# Patient Record
Sex: Female | Born: 1965 | Race: Black or African American | Hispanic: No | Marital: Single | Smoking: Never smoker
Health system: Southern US, Community
[De-identification: ages and names within clinical notes are randomized; demographics above are authoritative.]

## PROBLEM LIST (undated history)

## (undated) DIAGNOSIS — T7840XA Allergy, unspecified, initial encounter: Secondary | ICD-10-CM

## (undated) DIAGNOSIS — B1910 Unspecified viral hepatitis B without hepatic coma: Secondary | ICD-10-CM

## (undated) DIAGNOSIS — I10 Essential (primary) hypertension: Secondary | ICD-10-CM

## (undated) DIAGNOSIS — G43909 Migraine, unspecified, not intractable, without status migrainosus: Secondary | ICD-10-CM

## (undated) DIAGNOSIS — N289 Disorder of kidney and ureter, unspecified: Secondary | ICD-10-CM

## (undated) DIAGNOSIS — Z5189 Encounter for other specified aftercare: Secondary | ICD-10-CM

## (undated) HISTORY — DX: Encounter for other specified aftercare: Z51.89

## (undated) HISTORY — DX: Migraine, unspecified, not intractable, without status migrainosus: G43.909

## (undated) HISTORY — DX: Allergy, unspecified, initial encounter: T78.40XA

## (undated) HISTORY — PX: CHOLECYSTECTOMY: SHX55

---

## 1998-05-23 HISTORY — PX: LAPAROSCOPIC INCISIONAL / UMBILICAL / VENTRAL HERNIA REPAIR: SUR789

## 2002-05-23 HISTORY — PX: ABDOMINAL HYSTERECTOMY: SHX81

## 2005-05-23 DIAGNOSIS — G43909 Migraine, unspecified, not intractable, without status migrainosus: Secondary | ICD-10-CM

## 2005-05-23 HISTORY — DX: Migraine, unspecified, not intractable, without status migrainosus: G43.909

## 2015-11-05 ENCOUNTER — Emergency Department (HOSPITAL_COMMUNITY)
Admission: EM | Admit: 2015-11-05 | Discharge: 2015-11-05 | Disposition: A | Discharge status: Home / Self Care | Payer: Self-pay | Attending: Emergency Medicine | Admitting: Emergency Medicine

## 2015-11-05 ENCOUNTER — Encounter (HOSPITAL_COMMUNITY): Payer: Self-pay | Admitting: Emergency Medicine

## 2015-11-05 DIAGNOSIS — D509 Iron deficiency anemia, unspecified: Secondary | ICD-10-CM | POA: Insufficient documentation

## 2015-11-05 DIAGNOSIS — R1013 Epigastric pain: Secondary | ICD-10-CM | POA: Insufficient documentation

## 2015-11-05 DIAGNOSIS — Z79899 Other long term (current) drug therapy: Secondary | ICD-10-CM | POA: Insufficient documentation

## 2015-11-05 DIAGNOSIS — I10 Essential (primary) hypertension: Secondary | ICD-10-CM | POA: Insufficient documentation

## 2015-11-05 HISTORY — DX: Essential (primary) hypertension: I10

## 2015-11-05 HISTORY — DX: Disorder of kidney and ureter, unspecified: N28.9

## 2015-11-05 LAB — URINALYSIS, ROUTINE W REFLEX MICROSCOPIC
Bilirubin Urine: NEGATIVE
Glucose, UA: NEGATIVE mg/dL
Hgb urine dipstick: NEGATIVE
Ketones, ur: NEGATIVE mg/dL
Leukocytes, UA: NEGATIVE
Nitrite: NEGATIVE
Protein, ur: NEGATIVE mg/dL
Specific Gravity, Urine: 1.015 (ref 1.005–1.030)
pH: 7 (ref 5.0–8.0)

## 2015-11-05 LAB — HEPATIC FUNCTION PANEL
ALT: 16 U/L (ref 14–54)
AST: 25 U/L (ref 15–41)
Albumin: 3.1 g/dL — ABNORMAL LOW (ref 3.5–5.0)
Alkaline Phosphatase: 90 U/L (ref 38–126)
Bilirubin, Direct: <0.1 mg/dL — ABNORMAL LOW (ref 0.1–0.5)
Total Bilirubin: 0.4 mg/dL (ref 0.3–1.2)
Total Protein: 7.9 g/dL (ref 6.5–8.1)

## 2015-11-05 LAB — CBC
HCT: 33.3 % — ABNORMAL LOW (ref 36.0–46.0)
Hemoglobin: 10.1 g/dL — ABNORMAL LOW (ref 12.0–15.0)
MCH: 22.5 pg — ABNORMAL LOW (ref 26.0–34.0)
MCHC: 30.3 g/dL (ref 30.0–36.0)
MCV: 74.2 fL — ABNORMAL LOW (ref 78.0–100.0)
Platelets: 195 10*3/uL (ref 150–400)
RBC: 4.49 MIL/uL (ref 3.87–5.11)
RDW: 14.2 % (ref 11.5–15.5)
WBC: 5.1 10*3/uL (ref 4.0–10.5)

## 2015-11-05 LAB — BASIC METABOLIC PANEL
Anion gap: 6 (ref 5–15)
BUN: 9 mg/dL (ref 6–20)
CO2: 25 mmol/L (ref 22–32)
Calcium: 8.9 mg/dL (ref 8.9–10.3)
Chloride: 106 mmol/L (ref 101–111)
Creatinine, Ser: 1.37 mg/dL — ABNORMAL HIGH (ref 0.44–1.00)
GFR calc Af Amer: 50 mL/min — ABNORMAL LOW (ref >60)
GFR calc non Af Amer: 43 mL/min — ABNORMAL LOW (ref >60)
Glucose, Bld: 104 mg/dL — ABNORMAL HIGH (ref 65–99)
Potassium: 3.6 mmol/L (ref 3.5–5.1)
Sodium: 137 mmol/L (ref 135–145)

## 2015-11-05 LAB — LIPASE, BLOOD: Lipase: 36 U/L (ref 11–51)

## 2015-11-05 MED ORDER — GI COCKTAIL ~~LOC~~
30.0000 mL | Freq: Once | ORAL | Status: AC
  Administered 2015-11-05: 30 mL via ORAL
  Filled 2015-11-05: qty 30

## 2015-11-05 NOTE — ED Notes (Signed)
Patient states has been having near syncope since March.  Patient states she was told by previous doctor, in March, that she had abnormally high iron levels.  Patient states they told her if she had problems to follow up with a doctor.  Patient states she hasn't been able to follow up because she doesn't have a primary doctor and has no insurance.

## 2015-11-05 NOTE — Discharge Instructions (Signed)
Try Zantac 150 mg twice a day. You can buy this over-the-counter. Iron Deficiency Anemia, Adult Anemia is when you have a low number of healthy red blood cells. It is often caused by too little iron. This is called iron deficiency anemia. It may make you tired and short of breath. HOME CARE   Take iron as told by your doctor.  Take vitamins as told by your doctor.  Eat foods that have iron in them. This includes liver, lean beef, whole-grain bread, eggs, dried fruit, and dark green leafy vegetables. GET HELP RIGHT AWAY IF:  You pass out (faint).  You have chest pain.  You feel sick to your stomach (nauseous) or throw up (vomit).  You get very short of breath with activity.  You are weak.  You have a fast heartbeat.  You start to sweat for no reason.  You become light-headed when getting up from a chair or bed. MAKE SURE YOU:  Understand these instructions.  Will watch your condition.  Will get help right away if you are not doing well or get worse.   This information is not intended to replace advice given to you by your health care provider. Make sure you discuss any questions you have with your health care provider.   Document Released: 06/11/2010 Document Revised: 05/30/2014 Document Reviewed: 01/14/2013 Elsevier Interactive Patient Education [employer].

## 2015-11-05 NOTE — ED Notes (Signed)
MD at bedside. 

## 2015-11-05 NOTE — ED Provider Notes (Signed)
CSN: [id number]     Arrival date & time 11/05/15  1405 History  By signing my name below, I, [facility], attest that this documentation has been prepared under the direction and in the presence of Deno Etienne, DO. Electronically Signed: Virgel Bouquet, ED Scribe. 11/05/2015. 8:21 PM.   Chief Complaint  Patient presents with  . Near Syncope  . abnormal iron levels    The history is provided by the patient. No language interpreter was used.   HPI Comments: Raine Bloyer is a 50 y.o. female with with HTN and renal disorder who presents to the Emergency Department complaining of intermittent, mild, near syncope ongoing for the past 3 months. Pt states that she has had multiple near-syncopal episodes over the past month that are preceded by weakness, nausea, and dizziness that she describes as the room spinning. She notes that these episodes occur at rest and while moving and often occur shortly after eating with associated mild, epigastric abdominal pain. Per pt, she recently moved from [facility] and was notified by a provider from [facility] that she has elevated iron levels and advised to present to the ED if she became ill. Denies any other symptoms currently.  Pt also complains of intermittent, mild BLE pain ongoing for the past few months. She notes that these pains often occur at night while she is lying down to sleep. Pain is sporadically improved with sitting up and moving the leg.   Past Medical History  Diagnosis Date  . Renal disorder   . Hypertension    History reviewed. No pertinent past surgical history. No family history on file. Social History  Substance Use Topics  . Smoking status: Never Smoker   . Smokeless tobacco: None  . Alcohol Use: No   OB History    No data available     Review of Systems  Gastrointestinal: Positive for nausea and abdominal pain.  Musculoskeletal: Positive for myalgias (BLE).  Neurological: Positive for dizziness and  weakness.  All other systems reviewed and are negative.     Allergies  Review of patient's allergies indicates no known allergies.  Home Medications   Prior to Admission medications   Medication Sig Start Date End Date Taking? Authorizing Provider  acetaminophen (TYLENOL) 500 MG tablet Take 500 mg by mouth every 6 (six) hours as needed for mild pain.   Yes Historical Provider, MD  lisinopril (PRINIVIL,ZESTRIL) 5 MG tablet Take 5 mg by mouth daily.   Yes Historical Provider, MD   BP 154/76 mmHg  Pulse 84  Temp(Src) 98.3 F (36.8 C) (Oral)  Resp 16  Ht 5\' 5"  (1.651 m)  Wt 193 lb (87.544 kg)  BMI 32.12 kg/m2  SpO2 100% Physical Exam  Constitutional: She is oriented to person, place, and time. She appears well-developed and well-nourished. No distress.  HENT:  Head: Normocephalic and atraumatic.  Eyes: EOM are normal. Pupils are equal, round, and reactive to light.  Neck: Normal range of motion. Neck supple.  Cardiovascular: Normal rate and regular rhythm.  Exam reveals no gallop and no friction rub.   No murmur heard. Pulmonary/Chest: Effort normal. She has no wheezes. She has no rales.  Abdominal: Soft. She exhibits no distension. There is tenderness in the epigastric area. There is negative Murphy's sign.  Musculoskeletal: She exhibits no edema or tenderness.  2+ DP pulses bilaterally. Feet appear warm and well diffused.  Neurological: She is alert and oriented to person, place, and time.  Skin: Skin is warm and  dry. She is not diaphoretic.  Psychiatric: She has a normal mood and affect. Her behavior is normal.  Nursing note and vitals reviewed.   ED Course  Procedures   DIAGNOSTIC STUDIES: Oxygen Saturation is 100% on RA, normal by my interpretation.    COORDINATION OF CARE: 7:45 PM Will order Gi cocktail and labs. Discussed treatment plan with pt at bedside and pt agreed to plan.   Labs Review Labs Reviewed  BASIC METABOLIC PANEL - Abnormal; Notable for the  following:    Glucose, Bld 104 (*)    Creatinine, Ser 1.37 (*)    GFR calc non Af Amer 43 (*)    GFR calc Af Amer 50 (*)    All other components within normal limits  CBC - Abnormal; Notable for the following:    Hemoglobin 10.1 (*)    HCT 33.3 (*)    MCV 74.2 (*)    MCH 22.5 (*)    All other components within normal limits  HEPATIC FUNCTION PANEL - Abnormal; Notable for the following:    Albumin 3.1 (*)    Bilirubin, Direct <0.1 (*)    All other components within normal limits  URINALYSIS, ROUTINE W REFLEX MICROSCOPIC (NOT AT [facility])  LIPASE, BLOOD  CBG MONITORING, ED    Imaging Review No results found. I have personally reviewed and evaluated these images and lab results as part of my medical decision-making.   EKG Interpretation   Date/Time:  Thursday November 05 2015 14:22:12 EDT Ventricular Rate:  87 PR Interval:  146 QRS Duration: 74 QT Interval:  388 QTC Calculation: 466 R Axis:   -17 Text Interpretation:  Normal sinus rhythm Anterior infarct , age  undetermined Abnormal ECG No old tracing to compare Confirmed by Elinora Weigand MD,  DANIEL [phone]) on 11/05/2015 7:33:42 PM      MDM   Final diagnoses:  Iron deficiency anemia  Epigastric pain    50 yo F With a chief complaint of epigastric abdominal pain and near-syncope. Patient states that she eats something she gets severe epigastric pain that makes her feel like she needs to vomit and then she almost passes out. This happened multiple times.  Mild epigastric pain on my exam. Symptoms improved with a GI cocktail. Tolerating by mouth. Negative Murphy sign. Labs are unremarkable. Patient will do a trial of Zantac. Follow-up with the family doctor.  9:32 PM:  I have discussed the diagnosis/risks/treatment options with the patient and believe the pt to be eligible for discharge home to follow-up with PCP. We also discussed returning to the ED immediately if new or worsening sx occur. We discussed the sx which are most  concerning (e.g., sudden worsening pain, fever, inability to tolerate by mouth) that necessitate immediate return. Medications administered to the patient during their visit and any new prescriptions provided to the patient are listed below.  Medications given during this visit Medications  gi cocktail (Maalox,Lidocaine,Donnatal) (30 mLs Oral Given 11/05/15 2005)    New Prescriptions   No medications on file    The patient appears reasonably screen and/or stabilized for discharge and I doubt any other medical condition or other [facility] requiring further screening, evaluation, or treatment in the ED at this time prior to discharge.    I personally performed the services described in this documentation, which was scribed in my presence. The recorded information has been reviewed and is accurate.     Deno Etienne, DO 11/05/15 2132

## 2015-11-05 NOTE — ED Notes (Signed)
Patient Alert and oriented X4. Stable and ambulatory. Patient verbalized understanding of the discharge instructions.  Patient belongings were taken by the patient.  

## 2016-01-20 ENCOUNTER — Ambulatory Visit (INDEPENDENT_AMBULATORY_CARE_PROVIDER_SITE_OTHER): Payer: Self-pay | Admitting: Internal Medicine

## 2016-01-20 ENCOUNTER — Encounter: Payer: Self-pay | Admitting: Internal Medicine

## 2016-01-20 VITALS — BP 130/80 | HR 74 | Resp 18 | Ht 64.0 in | Wt 191.0 lb

## 2016-01-20 DIAGNOSIS — T7840XA Allergy, unspecified, initial encounter: Secondary | ICD-10-CM

## 2016-01-20 DIAGNOSIS — I10 Essential (primary) hypertension: Secondary | ICD-10-CM | POA: Insufficient documentation

## 2016-01-20 DIAGNOSIS — M25562 Pain in left knee: Secondary | ICD-10-CM

## 2016-01-20 DIAGNOSIS — D649 Anemia, unspecified: Secondary | ICD-10-CM

## 2016-01-20 DIAGNOSIS — N289 Disorder of kidney and ureter, unspecified: Secondary | ICD-10-CM

## 2016-01-20 MED ORDER — LISINOPRIL 5 MG PO TABS: 5.0000 mg | ORAL_TABLET | Freq: Every day | ORAL | 11 refills | Status: DC

## 2016-01-20 NOTE — Patient Instructions (Addendum)
Ice and elevated hands--ice for about 20 minutes every 2 hours for next 24-48 hours.  Call if pain does not resolve Get scholarship for the [facility] on [place] and walk laps in the 3 feet of the pool Tylenol 500 mg 2 tabs twice daily for knee pain

## 2016-01-20 NOTE — Progress Notes (Signed)
Subjective:    Patient ID: Marissa Warner, female    DOB: 1966/04/05, 50 y.o.   MRN: [id number]  HPI  New patient to establish  1.  Essential Hypertension:  Diagnosed in 2007. Taking Lisinopril 5 mg.  Tolerates well.  BP has been well controlled on this dosing.  2.  Environmental and Seasonal Allergies:  Taking Claritin D. Not clear if she has been on just the antihistamine or not.  Discussed the decongestant can increase her bp.  Has been on Flonase in the past as well with good response.   3.  Intermittent Heartburn:  Takes Ranitidine only very intermittently.    4.  Vitamin D deficiency:  Diagnosed in 2014 when in hospital when living in [place].  We are sending for records.  5.  Crush injury to fingertip pads of middle and ring fingers bilaterally last night when fingertips caught in fold of garage door as she was pulling it down.  Some swelling and discomfort today.  She soaked her fingertips in alcohol, but not ice.  6. Left knee area with stabbing pain:  First noted in March of 2017.  Went to ED in June.   The visit, however, was more focused on her stomach discomfort and presyncope.  Was told in ED to stop taking NSAIDS due to abdominal pain.   7/  GI complaints:  See #6:   Ranitidine reportedly prescribed at that ED visit. Noted to have a mild anemia with Hgb at 10.1.  MCV of 74.2.  History of iron deficiency anemia reportedly due to heavy periods, possibly uterine fibroids for which she underwent hysterectomy in 2004.  8.  Hx of Migraines.    Current Meds  Medication Sig  . acetaminophen (TYLENOL) 500 MG tablet Take 500 mg by mouth every 6 (six) hours as needed for mild pain.  . Glycerin-Polysorbate 80 (REFRESH DRY EYE THERAPY OP) Apply 2 drops to eye QID.  Marland Kitchen lisinopril (PRINIVIL,ZESTRIL) 5 MG tablet Take 1 tablet (5 mg total) by mouth daily.  Marland Kitchen loratadine-pseudoephedrine (CLARITIN-D 24-HOUR) 10-240 MG 24 hr tablet Take 1 tablet by mouth daily.  . ranitidine (ZANTAC) 150  MG tablet Take 150 mg by mouth daily.  . [DISCONTINUED] lisinopril (PRINIVIL,ZESTRIL) 5 MG tablet Take 5 mg by mouth daily.   No Known Allergies  Past Medical History:  Diagnosis Date  . Allergy    environmental and seasonal  . Blood transfusion without reported diagnosis before 2004   due to heavy periods  . Hypertension   . Migraines 2007  . Renal disorder    Pt. states "Stage III Kidney disease"  Never had kidney biopsy.  She is unable to tell me what the cause of the kidney disease.  Reportedly diagnosed with [facility] in [place].   Past Surgical History:  Procedure Laterality Date  . ABDOMINAL HYSTERECTOMY  2004   With BSO, she thinks  . CHOLECYSTECTOMY  uncertain date   Laparoscopic  . LAPAROSCOPIC INCISIONAL / UMBILICAL / VENTRAL HERNIA REPAIR  2000   Social History   Social History  . Marital status: Single    Spouse name: N/A  . Number of children: 4  . Years of education: 12   Occupational History  . unemployed     Previously in housekeeping   Social History Main Topics  . Smoking status: Never Smoker  . Smokeless tobacco: Current User    Types: Chew     Comment: twice daily  . Alcohol use No  .  Drug use: No  . Sexual activity: No   Other Topics Concern  . Not on file   Social History Narrative   Originally from [place], [place] far from [place], ([place])   [place] to [place] in March 2017 to be near daughter.   Lives alone near [place].      Review of Systems     Objective:   Physical Exam  NAD HEENT:  PERRL, EOMI, discs sharp without hemorrhage TMs pearly gray, throat without injection. Neck:  Supple, no adenopathy, no thyromegaly Chest:  CTA CV:  RRR with normal S1 and S2, No S3, S4 or murmur, no carotid bruits, carotid, radial and DP pulses normal and Equal Abd:  S, NT, No HSM or mass, + BS Fingers:  Fingertips of middle and ring fingers bilaterally with mild swelling.  Full ROM of fingers.  No crepitation.  No  erythema Left Knee:  Full ROM, hypertrophic changes.  No joint line tenderness or pain with compression of patella to anterior joint.  No effusion.  No laxity or tenderness with stress maneuvers of cruciate and collateral ligaments.          Assessment & Plan:  1.  Essential Hypertension:  Had slightly elevated creatinine in June of this year at 1.37.  Continue Lisinopril and recheck CMP at follow up in 4 months  2.  Anemia:  Mild.  Looking back at chart after patient left, Hysterectomy in 2004 and had still an anemia with ED visit in June.  Will have patient return for repeat CBC, iron studies and guaiac cards.  3.  Crush injury to fingers:  Does not appear to have bony injury.  Elevation, ice and rest.  4.  Left knee pain: Likely some OA.  Encouraged water activities to get weight off joint and strengthen quadriceps muscle.  With anemia history, Tylenol 1000 mg twice daily for pain.  5.  Allergies:  Encouraged trying plain Claritin without decongestant to see if adequate control.

## 2016-01-27 ENCOUNTER — Encounter (INDEPENDENT_AMBULATORY_CARE_PROVIDER_SITE_OTHER): Payer: Self-pay | Admitting: Licensed Clinical Social Worker

## 2016-01-27 DIAGNOSIS — Z658 Other specified problems related to psychosocial circumstances: Secondary | ICD-10-CM

## 2016-01-27 DIAGNOSIS — F439 Reaction to severe stress, unspecified: Secondary | ICD-10-CM
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
## 2016-02-03 ENCOUNTER — Other Ambulatory Visit: Payer: Self-pay | Admitting: Licensed Clinical Social Worker

## 2016-02-04 ENCOUNTER — Other Ambulatory Visit (INDEPENDENT_AMBULATORY_CARE_PROVIDER_SITE_OTHER): Payer: Self-pay | Admitting: Licensed Clinical Social Worker

## 2016-02-04 DIAGNOSIS — Z596 Low income: Secondary | ICD-10-CM

## 2016-02-04 DIAGNOSIS — Z569 Unspecified problems related to employment: Secondary | ICD-10-CM
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
## 2016-03-02 ENCOUNTER — Telehealth: Payer: Self-pay | Admitting: Internal Medicine

## 2016-03-02 DIAGNOSIS — D62 Acute posthemorrhagic anemia: Secondary | ICD-10-CM | POA: Insufficient documentation

## 2016-03-02 DIAGNOSIS — G8929 Other chronic pain: Secondary | ICD-10-CM | POA: Insufficient documentation

## 2016-03-02 DIAGNOSIS — M25562 Pain in left knee: Secondary | ICD-10-CM | POA: Insufficient documentation

## 2016-03-02 DIAGNOSIS — D649 Anemia, unspecified: Secondary | ICD-10-CM | POA: Insufficient documentation

## 2016-03-03 ENCOUNTER — Telehealth: Payer: Self-pay | Admitting: Licensed Clinical Social Worker

## 2016-03-03 NOTE — Telephone Encounter (Signed)
Social Work Intern ([place]) called Kendrick Fries to set up an appointment. Jesslynn did not answer. Voice mail was not set up.

## 2016-03-04 NOTE — Telephone Encounter (Signed)
Spoke with patient appointment made for tuesday

## 2016-03-07 ENCOUNTER — Other Ambulatory Visit: Payer: Self-pay

## 2016-03-08 ENCOUNTER — Other Ambulatory Visit (INDEPENDENT_AMBULATORY_CARE_PROVIDER_SITE_OTHER): Payer: Self-pay

## 2016-03-08 DIAGNOSIS — D649 Anemia, unspecified: Secondary | ICD-10-CM

## 2016-03-08 DIAGNOSIS — I10 Essential (primary) hypertension: Secondary | ICD-10-CM

## 2016-03-09 LAB — COMPREHENSIVE METABOLIC PANEL
ALT: 15 IU/L (ref 0–32)
AST: 22 IU/L (ref 0–40)
Albumin/Globulin Ratio: 0.9 — ABNORMAL LOW (ref 1.2–2.2)
Albumin: 3.6 g/dL (ref 3.5–5.5)
Alkaline Phosphatase: 93 IU/L (ref 39–117)
BUN/Creatinine Ratio: 15 (ref 9–23)
BUN: 18 mg/dL (ref 6–24)
Bilirubin Total: <0.2 mg/dL (ref 0.0–1.2)
CO2: 21 mmol/L (ref 18–29)
Calcium: 8.8 mg/dL (ref 8.7–10.2)
Chloride: 104 mmol/L (ref 96–106)
Creatinine, Ser: 1.19 mg/dL — ABNORMAL HIGH (ref 0.57–1.00)
GFR calc Af Amer: 61 mL/min/{1.73_m2} (ref >59)
GFR calc non Af Amer: 53 mL/min/{1.73_m2} — ABNORMAL LOW (ref >59)
Globulin, Total: 3.8 g/dL (ref 1.5–4.5)
Glucose: 108 mg/dL — ABNORMAL HIGH (ref 65–99)
Potassium: 4.1 mmol/L (ref 3.5–5.2)
Sodium: 139 mmol/L (ref 134–144)
Total Protein: 7.4 g/dL (ref 6.0–8.5)

## 2016-03-09 LAB — CBC WITH DIFFERENTIAL/PLATELET
Basophils Absolute: 0 10*3/uL (ref 0.0–0.2)
Basos: 0 %
EOS (ABSOLUTE): 0.1 10*3/uL (ref 0.0–0.4)
Eos: 1 %
Hematocrit: 31.9 % — ABNORMAL LOW (ref 34.0–46.6)
Hemoglobin: 9.8 g/dL — ABNORMAL LOW (ref 11.1–15.9)
Immature Grans (Abs): 0 10*3/uL (ref 0.0–0.1)
Immature Granulocytes: 0 %
Lymphocytes Absolute: 1.7 10*3/uL (ref 0.7–3.1)
Lymphs: 32 %
MCH: 23.2 pg — ABNORMAL LOW (ref 26.6–33.0)
MCHC: 30.7 g/dL — ABNORMAL LOW (ref 31.5–35.7)
MCV: 75 fL — ABNORMAL LOW (ref 79–97)
Monocytes Absolute: 0.3 10*3/uL (ref 0.1–0.9)
Monocytes: 5 %
Neutrophils Absolute: 3.4 10*3/uL (ref 1.4–7.0)
Neutrophils: 62 %
Platelets: 209 10*3/uL (ref 150–379)
RBC: 4.23 x10E6/uL (ref 3.77–5.28)
RDW: 16.1 % — ABNORMAL HIGH (ref 12.3–15.4)
WBC: 5.5 10*3/uL (ref 3.4–10.8)

## 2016-03-09 LAB — IRON AND TIBC
Iron Saturation: 22 % (ref 15–55)
Iron: 56 ug/dL (ref 27–159)
Total Iron Binding Capacity: 252 ug/dL (ref 250–450)
UIBC: 196 ug/dL (ref 131–425)

## 2016-04-07 ENCOUNTER — Encounter: Payer: Self-pay | Admitting: Internal Medicine

## 2016-04-07 ENCOUNTER — Ambulatory Visit (INDEPENDENT_AMBULATORY_CARE_PROVIDER_SITE_OTHER): Payer: Self-pay | Admitting: Internal Medicine

## 2016-04-07 VITALS — BP 124/72 | HR 80 | Temp 98.3°F | Resp 12 | Ht 63.0 in | Wt 186.0 lb

## 2016-04-07 DIAGNOSIS — R112 Nausea with vomiting, unspecified: Secondary | ICD-10-CM

## 2016-04-07 DIAGNOSIS — D649 Anemia, unspecified: Secondary | ICD-10-CM

## 2016-04-07 MED ORDER — PROCHLORPERAZINE MALEATE 10 MG PO TABS
10.0000 mg | ORAL_TABLET | Freq: Three times a day (TID) | ORAL | 0 refills | Status: DC | PRN
Start: 2016-04-07 — End: 2018-01-19

## 2016-04-07 NOTE — Progress Notes (Signed)
   Subjective:    Patient ID: Marissa Warner, female    DOB: 09/11/1965, 50 y.o.   MRN: [id number]  HPI  Difficult historian--loses focus Nausea and vomiting started yesterday.  Vomited 3 times yesterday and 3 times so far today.  Developed headache started this morning, left frontal and around left eye.  Describes as a sharp pain.  Took Tylenol this morning 1000 mg, but could not keep down.   Has been eating oatmeal, bread, boiled chicken and water.  Cinnamon [employer] yesterday as well.   No fever. Feels she has this repeatedly--states last was 1 month ago.  Then states has this twice monthly.  Feels this has been a problem since March. Normal BM this morning, though green.   No cough, congestion, sore throat.    Current Meds  Medication Sig  . acetaminophen (TYLENOL) 500 MG tablet Take 500 mg by mouth every 6 (six) hours as needed for mild pain.  Marland Kitchen lisinopril (PRINIVIL,ZESTRIL) 5 MG tablet Take 1 tablet (5 mg total) by mouth daily.  . ranitidine (ZANTAC) 150 MG tablet Take 150 mg by mouth daily.  . Vitamin D, Ergocalciferol, (DRISDOL) 50000 units CAPS capsule Take 50,000 Units by mouth every 7 (seven) days.   No Known Allergies   Review of Systems     Objective:   Physical Exam  Constitutional: She appears well-developed and well-nourished. No distress.  HENT:  Head: Normocephalic and atraumatic.  Right Ear: Tympanic membrane and ear canal normal.  Left Ear: Tympanic membrane, external ear and ear canal normal.  Nose: Nose normal.  Mouth/Throat: Uvula is midline and oropharynx is clear and moist. No posterior oropharyngeal erythema.  Eyes: EOM are normal. Pupils are equal, round, and reactive to light.  Neck: Normal range of motion. Neck supple.  Cardiovascular: Normal rate, regular rhythm, S1 normal and S2 normal.  Exam reveals no friction rub.   No murmur heard. Pulmonary/Chest: Effort normal and breath sounds normal.  Abdominal: Soft. Bowel sounds are normal. She  exhibits no mass. There is no hepatosplenomegaly. There is no tenderness. No hernia.  Lymphadenopathy:    She has no cervical adenopathy.          Assessment & Plan:  Nausea and vomiting:  Not sure if this is truly recurrent.  Did not discuss at previous visit, yet states this has been a recurring problem since March.  Appears well currently Treat Nausea with Compazine prn Clear liquids only for next 24 hours--frequent sips Tylenol for Headache Call if no improvement next 24 hours.  Regarding anemia:  Stool cards x3 given to return in 2 weeks. To get started on iron and will cancel recheck CBC on 11/30.  Will check when follows up end of December   Stop chewing tobacco

## 2016-04-07 NOTE — Patient Instructions (Addendum)
  For next 24 hours, sip throughout the day on clear liquids.  May take Tylenol for headache. No solids Tomorrow, if able to keep clear liquids down, may eat bananas, crackers, chicken noodle soup, jello, toast, rice or rice cereal.   When you are feeling better, to work on blood sugar and weight loss:    Drink a glass of water before every meal Drink 6-8 glasses of water daily Eat three meals daily Eat a protein and healthy fat with every meal (eggs,fish, chicken, [place] and limit red meats) Eat 5 servings of vegetables daily, mix the colors Eat 2 servings of fruit daily with skin, if skin is edible Use smaller plates Put food/utensils down as you chew and swallow each bite Eat at a table with friends/family at least once daily, no TV Do not eat in front of the TV

## 2016-05-20 ENCOUNTER — Ambulatory Visit: Payer: Self-pay | Admitting: Internal Medicine

## 2016-06-15 ENCOUNTER — Ambulatory Visit: Payer: Self-pay | Admitting: Internal Medicine

## 2016-07-26 ENCOUNTER — Encounter: Payer: Self-pay | Admitting: Internal Medicine

## 2016-07-26 ENCOUNTER — Ambulatory Visit (INDEPENDENT_AMBULATORY_CARE_PROVIDER_SITE_OTHER): Payer: Self-pay | Admitting: Internal Medicine

## 2016-07-26 VITALS — BP 130/90 | HR 80 | Temp 97.8°F | Resp 12 | Ht 64.5 in | Wt 180.0 lb

## 2016-07-26 DIAGNOSIS — I10 Essential (primary) hypertension: Secondary | ICD-10-CM

## 2016-07-26 DIAGNOSIS — J069 Acute upper respiratory infection, unspecified: Secondary | ICD-10-CM

## 2016-07-26 DIAGNOSIS — N289 Disorder of kidney and ureter, unspecified: Secondary | ICD-10-CM

## 2016-07-26 DIAGNOSIS — G43709 Chronic migraine without aura, not intractable, without status migrainosus: Secondary | ICD-10-CM

## 2016-07-26 DIAGNOSIS — D649 Anemia, unspecified: Secondary | ICD-10-CM

## 2016-07-26 DIAGNOSIS — R739 Hyperglycemia, unspecified: Secondary | ICD-10-CM

## 2016-07-26 DIAGNOSIS — B9789 Other viral agents as the cause of diseases classified elsewhere: Secondary | ICD-10-CM

## 2016-07-26 MED ORDER — VERAPAMIL HCL ER 180 MG PO CP24: ORAL_CAPSULE | ORAL | 11 refills | Status: DC

## 2016-07-26 NOTE — Progress Notes (Signed)
Subjective:    Patient ID: Marissa Warner, female    DOB: 1966/05/05, 51 y.o.   MRN: [id number]  HPI   1.  Essential Hypertension:  States has not missed her Lisinopril 5 mg daily.  Is ill today.  Denies use of decongestant.  Taking Mucinex and Tylenol.  Did chew tobacco this morning before coming in.   Has not followed up as recommended with mild renal insufficiency and bloodwork.  2.  Tobacco Use:  States she is trying to stop.  Chews once daily.  Not interested in discussing today.  3.  2-3 days ago, started with cough, rarely productive of white sputum.  No fever, sore throat.  Her chest anteriorly is sore when coughs.  No myalgias or joint pain with this illness.  No dyspnea.  As above, using Mucinex and Tylenol.  4.  Headaches:  States she was diagnosed with migraines in 2007.  No aura.  Headaches are left sided, frontal or parietal areas.  Throbbing pain.  No photobphobia, possibly some phonophobia.  Sometimes has associated nausea and vomiting.   Diagnosed and treated at [facility] in [place], [place].  She does not believe she mentioned this office before and has not signed a release.  She cannot recall if she was treated with a tryptan in the past or not. Cannot recall if took a preventive in past.   Cannot get a good idea of how often she has a headache--as often as twice weekly.  States she has had one now for 2-3 days.  Not clear how often the headache resolves after sleep. Current headache at same time as respiratory illness.  5. Iron Deficiency anemia:  Has been taking ferrous gluconate once daily 324 mg.  Last colonoscopy in 03/2015 at [facility] for nausea and vomiting.  She was found to have diverticulosis and internal hemorrhoids.  EGD the same day showed moderately erythematous mucosa without active bleeding.  Biopsies were normal. Continues on Ranitidine 150 mg once daily.  Not taking regularly as reported back in August when asked her to do so.     6.   Hyperglycemia:  Noted last labs.  Did not follow up as requested. No history of elevated glucose per patient.  A1C   Current Meds  Medication Sig  . acetaminophen (TYLENOL) 500 MG tablet Take 500 mg by mouth every 6 (six) hours as needed for mild pain.  . Ferrous Gluconate 324 (37.5 Fe) MG TABS Take by mouth 2 (two) times daily.  . Glycerin-Polysorbate 80 (REFRESH DRY EYE THERAPY OP) Apply 2 drops to eye QID.  Marland Kitchen guaiFENesin (MUCINEX) 600 MG 12 hr tablet Take by mouth 2 (two) times daily.  Marland Kitchen lisinopril (PRINIVIL,ZESTRIL) 5 MG tablet Take 1 tablet (5 mg total) by mouth daily.  . ranitidine (ZANTAC) 150 MG tablet Take 150 mg by mouth daily.  . Vitamin D, Ergocalciferol, (DRISDOL) 50000 units CAPS capsule Take 50,000 Units by mouth every 7 (seven) days.    No Known Allergies    Review of Systems     Objective:   Physical Exam  Coughing, but in no distress. HEENT:  PERRL, EOMI, palpebral conjunctivae pale,  Discs sharp. TMs pearly gray, throat without injection, edentulous. Neck:  Supple, No adenopathy Chest:  CTA CV:  RRR without rub.  Radial and DP pulses normal and equal Abd:  S, NT, No HSM or mass, + BS Neuro:  A & O x 3, CN II-XII grossly intact, DTRs 2+/4 throughout, Motor 5/5  throughtout.  Gait normal.        Assessment & Plan:  1.  Essential Hypertension:  Likely up a bit due to illness.  No decongestant use.  Adding Verapamil for migraine prevention, which will help control as well.  BP and pulse check 2 weeks after starting.  2.  Migraines:  Send for old records.  Poor historian, but sounds like having migraines regularly, so start prophylaxis with Verapamil.  3.  URI:  Supportive care.  Avoid decongestants  To call if worsen.  4.  Renal insufficiency:  CMP recheck  5.  Hyperglycemia:  Noted in labs last fall.  A1C  6.  Anemia with borderline low iron studies.  To get stool cards in--states she still has, though EGD and colonoscopy without concerning findings.  Also  asked she use the Ranitidine regularly.  Check B12 and Folate as well.  To take iron twice daily, not once.  7.  Chew Tobacco use:  No interest in ways to support her stopping use.

## 2016-07-27 ENCOUNTER — Telehealth: Payer: Self-pay | Admitting: Internal Medicine

## 2016-07-27 LAB — FOLATE: Folate: 9.5 ng/mL (ref >3.0)

## 2016-07-27 LAB — CBC WITH DIFFERENTIAL/PLATELET
Basophils Absolute: 0 10*3/uL (ref 0.0–0.2)
Basos: 0 %
EOS (ABSOLUTE): 0.1 10*3/uL (ref 0.0–0.4)
Eos: 2 %
Hematocrit: 33.9 % — ABNORMAL LOW (ref 34.0–46.6)
Hemoglobin: 10.5 g/dL — ABNORMAL LOW (ref 11.1–15.9)
Immature Grans (Abs): 0 10*3/uL (ref 0.0–0.1)
Immature Granulocytes: 0 %
Lymphocytes Absolute: 1 10*3/uL (ref 0.7–3.1)
Lymphs: 19 %
MCH: 22.6 pg — ABNORMAL LOW (ref 26.6–33.0)
MCHC: 31 g/dL — ABNORMAL LOW (ref 31.5–35.7)
MCV: 73 fL — ABNORMAL LOW (ref 79–97)
Monocytes Absolute: 0.6 10*3/uL (ref 0.1–0.9)
Monocytes: 11 %
Neutrophils Absolute: 3.3 10*3/uL (ref 1.4–7.0)
Neutrophils: 68 %
Platelets: 196 10*3/uL (ref 150–379)
RBC: 4.64 x10E6/uL (ref 3.77–5.28)
RDW: 16.3 % — ABNORMAL HIGH (ref 12.3–15.4)
WBC: 5 10*3/uL (ref 3.4–10.8)

## 2016-07-27 LAB — VITAMIN B12: Vitamin B-12: 1011 pg/mL (ref 232–1245)

## 2016-07-27 LAB — HGB A1C W/O EAG: Hgb A1c MFr Bld: 5.4 % (ref 4.8–5.6)

## 2016-07-27 NOTE — Telephone Encounter (Signed)
To Dr. Mulberry FYI 

## 2016-07-27 NOTE — Telephone Encounter (Signed)
Pat received a call from [facility].  They have not seen patient since December 12, 2007.  Can do 2007, 2008, 2009 notes.  No labs  Or consults.  Old software they had was corrupted.  Records that they have will be sent in 60 days.   This message received from [facility] [place], [place]

## 2016-08-01 NOTE — Telephone Encounter (Signed)
Ok--guess we won't be getting that information then

## 2016-10-24 ENCOUNTER — Encounter (HOSPITAL_COMMUNITY): Payer: Self-pay | Admitting: *Deleted

## 2016-10-24 ENCOUNTER — Emergency Department (HOSPITAL_COMMUNITY)
Admission: EM | Admit: 2016-10-24 | Discharge: 2016-10-24 | Disposition: A | Discharge status: Home / Self Care | Payer: Self-pay | Attending: Emergency Medicine | Admitting: Emergency Medicine

## 2016-10-24 ENCOUNTER — Telehealth: Payer: Self-pay | Admitting: Internal Medicine

## 2016-10-24 DIAGNOSIS — Z79899 Other long term (current) drug therapy: Secondary | ICD-10-CM | POA: Insufficient documentation

## 2016-10-24 DIAGNOSIS — I10 Essential (primary) hypertension: Secondary | ICD-10-CM | POA: Insufficient documentation

## 2016-10-24 DIAGNOSIS — M5412 Radiculopathy, cervical region: Secondary | ICD-10-CM | POA: Insufficient documentation

## 2016-10-24 MED ORDER — GABAPENTIN 100 MG PO CAPS: 100.0000 mg | ORAL_CAPSULE | Freq: Three times a day (TID) | ORAL | 0 refills | Status: DC

## 2016-10-24 MED ORDER — KETOROLAC TROMETHAMINE 60 MG/2ML IM SOLN
15.0000 mg | Freq: Once | INTRAMUSCULAR | Status: AC
  Administered 2016-10-24: 15 mg via INTRAMUSCULAR
  Filled 2016-10-24: qty 2

## 2016-10-24 MED ORDER — MELOXICAM 7.5 MG PO TABS: 7.5000 mg | ORAL_TABLET | Freq: Every day | ORAL | 0 refills | Status: DC | PRN

## 2016-10-24 MED ORDER — OXYCODONE-ACETAMINOPHEN 5-325 MG PO TABS
1.0000 | ORAL_TABLET | Freq: Once | ORAL | Status: AC
  Administered 2016-10-24: 1 via ORAL
  Filled 2016-10-24: qty 1

## 2016-10-24 MED ORDER — DEXAMETHASONE 4 MG PO TABS
12.0000 mg | ORAL_TABLET | Freq: Once | ORAL | Status: AC
  Administered 2016-10-24: 12 mg via ORAL
  Filled 2016-10-24: qty 3

## 2016-10-24 NOTE — ED Provider Notes (Signed)
[facility] DEPT Provider Note   CSN: [id number] Arrival date & time: 10/24/16  1023  By signing my name below, I, Levester Fresh, attest that this documentation has been prepared under the direction and in the presence of Virgel Manifold, MD . Electronically Signed: Levester Fresh, Scribe. 10/24/2016. 1:18 PM.  History   Chief Complaint Chief Complaint  Patient presents with  . Pain  . Numbness   HPI Comments Marissa Warner is a 51 y.o. female with a PMHx significant for hypertension, who presents to the Emergency Department with complaints of episodes of sharp, "pins and needles" left neck and shoulder pain.  This radiates to her left arm, all left fingers and sometimes to her left leg.  These episodes have been occurring intermittently since May of 2016 and take place mostly at night.  No notable triggers. No worsening or alleviating factors.  No hx of DM.  No recent trauma.  Pt has attempted to treat her sx with Tylenol and experienced no relief.  She has not seen a MD for the symptoms prior to today. Sensation intact to left hand, with no numbness or weakness. She reports some trouble sleeping 2/2 discomfort.  Pt with hx of fall in 2016 with similar sx 2/2 muscle spasms.  Today, she denies experiencing any other acute sx, including fever or chills.   The history is provided by the patient and medical records. No language interpreter was used.   Past Medical History:  Diagnosis Date  . Allergy    environmental and seasonal  . Blood transfusion without reported diagnosis before 2004   due to heavy periods  . Hypertension   . Migraines 2007  . Renal disorder    Pt. states "Stage III Kidney disease"  Never had kidney biopsy.  She is unable to tell me what the cause of the kidney disease.  Reportedly diagnosed with [facility] in [place].    Patient Active Problem List   Diagnosis Date Noted  . Anemia 03/02/2016  . Left knee pain 03/02/2016  . Essential hypertension  01/20/2016  . Allergy   . Hypertension   . Renal disorder     Past Surgical History:  Procedure Laterality Date  . ABDOMINAL HYSTERECTOMY  2004   With BSO, she thinks  . CHOLECYSTECTOMY  uncertain date   Laparoscopic  . LAPAROSCOPIC INCISIONAL / UMBILICAL / VENTRAL HERNIA REPAIR  2000    OB History    No data available       Home Medications    Prior to Admission medications   Medication Sig Start Date End Date Taking? Authorizing Provider  acetaminophen (TYLENOL) 500 MG tablet Take 500 mg by mouth every 6 (six) hours as needed for mild pain.    [provider]  Ferrous Gluconate 324 (37.5 Fe) MG TABS Take by mouth 2 (two) times daily.    [provider]  gabapentin (NEURONTIN) 100 MG capsule Take 1 capsule (100 mg total) by mouth 3 (three) times daily. 10/24/16   Virgel Manifold, MD  Glycerin-Polysorbate 80 (REFRESH DRY EYE THERAPY OP) Apply 2 drops to eye QID.    [provider]  guaiFENesin (MUCINEX) 600 MG 12 hr tablet Take by mouth 2 (two) times daily.    [provider]  lisinopril (PRINIVIL,ZESTRIL) 5 MG tablet Take 1 tablet (5 mg total) by mouth daily. 01/20/16   Mack Hook, MD  meloxicam (MOBIC) 7.5 MG tablet Take 1 tablet (7.5 mg total) by mouth daily as needed for  pain. 10/24/16   Virgel Manifold, MD  prochlorperazine (COMPAZINE) 10 MG tablet Take 1 tablet (10 mg total) by mouth every 8 (eight) hours as needed for nausea or vomiting. Patient not taking: Reported on 07/26/2016 04/07/16   Mack Hook, MD  ranitidine (ZANTAC) 150 MG tablet Take 150 mg by mouth daily.    [provider]  verapamil (VERELAN PM) 180 MG 24 hr capsule 1 cap by mouth daily 07/26/16   Mack Hook, MD  Vitamin D, Ergocalciferol, (DRISDOL) 50000 units CAPS capsule Take 50,000 Units by mouth every 7 (seven) days.    [provider]    Family History History reviewed. No pertinent family history.  Social History Social History   Substance Use Topics  . Smoking status: Never Smoker  . Smokeless tobacco: Current User    Types: Chew     Comment: twice daily  . Alcohol use No   Allergies   Patient has no known allergies.  Review of Systems Review of Systems  Constitutional: Negative for chills and fever.  Musculoskeletal: Positive for neck pain.  Neurological: Positive for numbness. Negative for weakness.  All other systems reviewed and are negative.  Physical Exam Updated Vital Signs BP 113/89 (BP Location: Left Arm)   Pulse 76   Temp 98.2 F (36.8 C) (Oral)   Resp 18   SpO2 100%   Physical Exam  Constitutional: She is oriented to person, place, and time. She appears well-developed and well-nourished. No distress.  HENT:  Head: Normocephalic and atraumatic.  Eyes: EOM are normal.  Neck: Normal range of motion.  No neck tenderness.  Cardiovascular: Normal rate, regular rhythm and normal heart sounds.   Pulmonary/Chest: Effort normal and breath sounds normal.  Abdominal: Soft.  Musculoskeletal: Normal range of motion.  Strength and sensation normal in bilateral upper extremities.  Palpable left radial pulse.  Neurological: She is alert and oriented to person, place, and time.  Skin: Skin is warm and dry.  Psychiatric: She has a normal mood and affect. Judgment normal.  Nursing note and vitals reviewed.  ED Treatments / Results  DIAGNOSTIC STUDIES: Oxygen Saturation is 100% on room air, normal by my interpretation.    COORDINATION OF CARE: 12:45 PM Discussed treatment plan with pt at bedside and pt agreed to plan.  Labs (all labs ordered are listed, but only abnormal results are displayed) Labs Reviewed - No data to display  EKG  EKG Interpretation None      Radiology No results found.  Procedures Procedures (including critical care time)  Medications Ordered in ED Medications  ketorolac (TORADOL) injection 15 mg (not administered)  oxyCODONE-acetaminophen (PERCOCET/ROXICET)  5-325 MG per tablet 1 tablet (not administered)  dexamethasone (DECADRON) tablet 12 mg (not administered)     Initial Impression / Assessment and Plan / ED Course  I have reviewed the triage vital signs and the nursing notes.  Pertinent labs & imaging results that were available during my care of the patient were reviewed by me and considered in my medical decision making (see chart for details).     51yF with L neck and LUE pain. Suspect cervical radiculopathy. Neuro exam is nonfocal. I doubt vascular issue. No reported trauma. Doubt CVA with description of symptoms and exam. Very atypical for ACS. Will treat symptomatically.   It has been determined that no acute conditions requiring further emergency intervention are present at this time. The patient has been advised of the diagnosis and plan. We have discussed signs and symptoms that  warrant return to the ED and they are listed in the discharge instructions.   I personally preformed the services scribed in my presence. The recorded information has been reviewed is accurate. Virgel Manifold, MD.   Final Clinical Impressions(s) / ED Diagnoses   Final diagnoses:  Cervical radiculopathy    New Prescriptions New Prescriptions   GABAPENTIN (NEURONTIN) 100 MG CAPSULE    Take 1 capsule (100 mg total) by mouth 3 (three) times daily.   MELOXICAM (MOBIC) 7.5 MG TABLET    Take 1 tablet (7.5 mg total) by mouth daily as needed for pain.     Virgel Manifold, MD 10/24/16 1324

## 2016-10-24 NOTE — Telephone Encounter (Signed)
Patient called back with update. States she had a pinched nerve. Patient was scheduled for appointment with Dr. Amil Amen for tomorrow. Patient cancelled and rescheduled for 11/14/16.

## 2016-10-24 NOTE — Telephone Encounter (Signed)
Pat confirming appointments.  Patient stated she is in the ER, was experiencing burning, tingling pain in side.  Patient stated she would call the office once she leaves the ER to give an update.

## 2016-10-24 NOTE — ED Triage Notes (Signed)
Pt reports left neck/shoulder pain that radiates down her left side, arm and leg. Has been occurring intermittently since 2016. Reports it gets worse when lying down at night and causes a numbness/tingling to left side of her body. No acute distress is noted at triage.

## 2016-10-25 ENCOUNTER — Ambulatory Visit: Payer: Self-pay | Admitting: Internal Medicine

## 2016-11-14 ENCOUNTER — Ambulatory Visit: Payer: Self-pay | Admitting: Internal Medicine

## 2016-11-16 ENCOUNTER — Ambulatory Visit: Payer: Self-pay | Admitting: Internal Medicine

## 2016-12-23 ENCOUNTER — Ambulatory Visit (INDEPENDENT_AMBULATORY_CARE_PROVIDER_SITE_OTHER): Payer: Self-pay | Admitting: Internal Medicine

## 2016-12-23 ENCOUNTER — Encounter: Payer: Self-pay | Admitting: Internal Medicine

## 2016-12-23 VITALS — BP 130/90 | HR 86 | Resp 12 | Ht 64.5 in | Wt 180.0 lb

## 2016-12-23 DIAGNOSIS — R109 Unspecified abdominal pain: Secondary | ICD-10-CM

## 2016-12-23 DIAGNOSIS — I10 Essential (primary) hypertension: Secondary | ICD-10-CM

## 2016-12-23 DIAGNOSIS — D649 Anemia, unspecified: Secondary | ICD-10-CM

## 2016-12-23 DIAGNOSIS — R7989 Other specified abnormal findings of blood chemistry: Secondary | ICD-10-CM

## 2016-12-23 DIAGNOSIS — R739 Hyperglycemia, unspecified: Secondary | ICD-10-CM

## 2016-12-23 LAB — POCT URINALYSIS DIPSTICK
Bilirubin, UA: NEGATIVE
Blood, UA: NEGATIVE
Glucose, UA: NEGATIVE
Ketones, UA: NEGATIVE
Leukocytes, UA: NEGATIVE
Nitrite, UA: NEGATIVE
Protein, UA: 15
Spec Grav, UA: 1.025 (ref 1.010–1.025)
Urobilinogen, UA: 0.2 E.U./dL
pH, UA: 5 (ref 5.0–8.0)

## 2016-12-23 MED ORDER — LISINOPRIL 10 MG PO TABS: ORAL_TABLET | ORAL | 11 refills | Status: DC

## 2016-12-23 NOTE — Progress Notes (Signed)
Subjective:    Patient ID: Marissa Warner, female    DOB: March 21, 1966, 51 y.o.   MRN: [id number]  HPI  1.  Essential Hypertension:  Taking Lisinopril 5 mg daily.  Does not miss.  Was on 10 mg in past when living in [place], apparently some concern with renal function on a higher dose.  She does have a mildly elevated creatinine now.  DBP still a bit high at 90.  2.  Anemia:  Is taking iron tabs once daily.  Iron studies were borderline.  B12 and folate.  Perhaps with thall trait or trait for other abnormal hemoglobin.  May have a niece with abnormal hemoglobin or sickle cell trait.  Her children have sickle cell trait.  She is not sure if her children's father have the trait.    3.  Hyperglycemia:  A1C was in normal range at 5.4%, but discussed she is at increased risk for DM in the future.  She has worked on diet.  She is walking 3 days weekly to [employer].  About 30 minutes.  She is willing to work this up.  She is thinking about Zumba classes.  4.  Tobacco Use:  Chewing tobacco.  States she has cut back.  Not interested in any discussion about this today.  5.  Right ear feels like something in it this week.  Wants me to check. Seems like that is better, however.    6.  Low bilateral thoracic back pain vs. Flank pain:  Mild, no fever, dysuria or increased frequency, no hematuria.  No suprapubic pain.   Current Meds  Medication Sig  . acetaminophen (TYLENOL) 500 MG tablet Take 500 mg by mouth every 6 (six) hours as needed for mild pain.  . Ferrous Gluconate 324 (37.5 Fe) MG TABS Take by mouth 2 (two) times daily.  Marland Kitchen gabapentin (NEURONTIN) 100 MG capsule Take 1 capsule (100 mg total) by mouth 3 (three) times daily.  . Glycerin-Polysorbate 80 (REFRESH DRY EYE THERAPY OP) Apply 2 drops to eye QID.  Marland Kitchen guaiFENesin (MUCINEX) 600 MG 12 hr tablet Take by mouth 2 (two) times daily.  Marland Kitchen lisinopril (PRINIVIL,ZESTRIL) 10 MG tablet 1 tab by mouth daily  . meloxicam (MOBIC) 7.5 MG tablet Take 1 tablet  (7.5 mg total) by mouth daily as needed for pain.  . ranitidine (ZANTAC) 150 MG tablet Take 150 mg by mouth daily.  . verapamil (VERELAN PM) 180 MG 24 hr capsule 1 cap by mouth daily  . Vitamin D, Ergocalciferol, (DRISDOL) 50000 units CAPS capsule Take 50,000 Units by mouth every 7 (seven) days.  . [DISCONTINUED] lisinopril (PRINIVIL,ZESTRIL) 5 MG tablet Take 1 tablet (5 mg total) by mouth daily.    No Known Allergies   Review of Systems     Objective:   Physical Exam NAD HEENT:  PERRL, EOMI, TMs pearly gray and canals clear.  Throat without injection. Neck:  Supple, No adenopathy, no thyromegaly Chest:  CTA CV:  RRR with normal S1 and S2, No S3, S4 or murmur, radial and DP pulses normal and equal Abd:  S, NT, No flank tenderness, No HSM or mass, No suprapubic tenderness. + BS LE:  No edema       Assessment & Plan:  1.  Essential Hypertension:  Check BMP for creatinine level and UA before increase of ACEI.  Increase Lisinopril to 10 mg daily with repeat bp and pulse and BMP in 2 weeks.  2.  Anemia:  CBC--was improved last check.  Taking iron.  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  May be sickle cell trait as well.  3.  Hyperglycemia:  To work on diet and increasing regular physical activity for weight loss.  + family history.  4.  Mild back pain:  No findings on exam.  Checking UA.  5.  Right ear complaints:  Resolved now and no abnormal findings.  followup in 3 months as well for CPE without pap

## 2016-12-24 LAB — CBC WITH DIFFERENTIAL/PLATELET
Basophils Absolute: 0 10*3/uL (ref 0.0–0.2)
Basos: 1 %
EOS (ABSOLUTE): 0 10*3/uL (ref 0.0–0.4)
Eos: 1 %
Hematocrit: 35.3 % (ref 34.0–46.6)
Hemoglobin: 10.8 g/dL — ABNORMAL LOW (ref 11.1–15.9)
Immature Grans (Abs): 0 10*3/uL (ref 0.0–0.1)
Immature Granulocytes: 0 %
Lymphocytes Absolute: 1.9 10*3/uL (ref 0.7–3.1)
Lymphs: 36 %
MCH: 23.5 pg — ABNORMAL LOW (ref 26.6–33.0)
MCHC: 30.6 g/dL — ABNORMAL LOW (ref 31.5–35.7)
MCV: 77 fL — ABNORMAL LOW (ref 79–97)
Monocytes Absolute: 0.3 10*3/uL (ref 0.1–0.9)
Monocytes: 6 %
Neutrophils Absolute: 3 10*3/uL (ref 1.4–7.0)
Neutrophils: 56 %
Platelets: 216 10*3/uL (ref 150–379)
RBC: 4.59 x10E6/uL (ref 3.77–5.28)
RDW: 16.2 % — ABNORMAL HIGH (ref 12.3–15.4)
WBC: 5.2 10*3/uL (ref 3.4–10.8)

## 2016-12-24 LAB — BASIC METABOLIC PANEL
BUN/Creatinine Ratio: 12 (ref 9–23)
BUN: 14 mg/dL (ref 6–24)
CO2: 21 mmol/L (ref 20–29)
Calcium: 9.1 mg/dL (ref 8.7–10.2)
Chloride: 105 mmol/L (ref 96–106)
Creatinine, Ser: 1.18 mg/dL — ABNORMAL HIGH (ref 0.57–1.00)
GFR calc Af Amer: 61 mL/min/{1.73_m2} (ref >59)
GFR calc non Af Amer: 53 mL/min/{1.73_m2} — ABNORMAL LOW (ref >59)
Glucose: 77 mg/dL (ref 65–99)
Potassium: 4.3 mmol/L (ref 3.5–5.2)
Sodium: 140 mmol/L (ref 134–144)

## 2017-01-06 ENCOUNTER — Other Ambulatory Visit: Payer: Self-pay

## 2017-01-16 ENCOUNTER — Other Ambulatory Visit: Payer: Self-pay

## 2017-03-31 ENCOUNTER — Ambulatory Visit: Payer: Self-pay | Admitting: Internal Medicine

## 2017-05-02 ENCOUNTER — Ambulatory Visit: Payer: Self-pay | Admitting: Internal Medicine

## 2017-06-21 ENCOUNTER — Ambulatory Visit: Payer: Self-pay | Admitting: Internal Medicine

## 2017-12-22 ENCOUNTER — Other Ambulatory Visit: Payer: Self-pay | Admitting: Internal Medicine

## 2018-01-19 ENCOUNTER — Encounter: Payer: Self-pay | Admitting: Internal Medicine

## 2018-01-19 ENCOUNTER — Ambulatory Visit: Payer: Self-pay | Admitting: Internal Medicine

## 2018-01-19 VITALS — BP 130/88 | HR 88 | Resp 14 | Ht 64.5 in | Wt 187.5 lb

## 2018-01-19 DIAGNOSIS — H524 Presbyopia: Secondary | ICD-10-CM

## 2018-01-19 DIAGNOSIS — N289 Disorder of kidney and ureter, unspecified: Secondary | ICD-10-CM

## 2018-01-19 DIAGNOSIS — G43809 Other migraine, not intractable, without status migrainosus: Secondary | ICD-10-CM

## 2018-01-19 DIAGNOSIS — M542 Cervicalgia: Secondary | ICD-10-CM

## 2018-01-19 DIAGNOSIS — I10 Essential (primary) hypertension: Secondary | ICD-10-CM

## 2018-01-19 MED ORDER — SUMATRIPTAN SUCCINATE 50 MG PO TABS: ORAL_TABLET | ORAL | 11 refills | Status: DC

## 2018-01-19 MED ORDER — LISINOPRIL 10 MG PO TABS: ORAL_TABLET | ORAL | 3 refills | Status: DC

## 2018-01-19 MED ORDER — CYCLOBENZAPRINE HCL 10 MG PO TABS: ORAL_TABLET | ORAL | 0 refills | Status: DC

## 2018-01-19 NOTE — Progress Notes (Signed)
   Subjective:    Patient ID: Marissa Warner, female    DOB: 19-Jul-1965, 52 y.o.   MRN: [id number]  HPI   Here after a year's hiatus.  1.  Essential Hypertension:  Had not run out of Lisinopril.  Tolerating fine.  2.  Muscle spasm:  Bilateral trap area with numbness and tingling down her left arm.  Has been bothering her for about 6 months.   Patient with limited transportation. Taking Tylenol  500 mg up to 4 times daily.  Uses this for headaches as well.  3.  Migraine Headaches:  See visit from 07/2016.  She was to start on Verapamil.  She cannot recall if she took the Verapamil.  Cannot recall if she did take if it helped.  Rx was sent to [facility] and would have been delivered.  She knows she did not have that. Having monthly headaches lasting 4 days. She has never taken a triptan  4.  Vision:  Vision gets blurry.  She has a history of having reading glasses, but has not used in some time. She cannot afford the $70 to go to an eye doctor.    5.  Gets nauseated with eating twice daily.  Does vomit at times.  Very difficult historian.  6.  Anemia:  Not taking iron.  Current Meds  Medication Sig  . acetaminophen (TYLENOL) 500 MG tablet Take 500 mg by mouth every 6 (six) hours as needed for mild pain.  . Glycerin-Polysorbate 80 (REFRESH DRY EYE THERAPY OP) Apply 2 drops to eye QID.  Marland Kitchen lisinopril (PRINIVIL,ZESTRIL) 10 MG tablet TAKE 1 TABLET BY MOUTH ONCE DAILY    No Known Allergies     Review of Systems     Objective:   Physical Exam  NAD HEENT:  PERRL, EOMI, Discs sharp, TMs pearly gray, throat without injection Neck:  Supple, No adenopathy, mildly enlarged thyroid Chest:  CTA CV:  RRR with normal S1 and S2, No S3, S4 or murmur.  Radial and DP pulses normal and equal LE:  No edema      Assessment & Plan:  1.  Hypertension:  Controlled. Return for fasting labs.  Labs in 1 week to check kidney function  2.  Presbyopia:  +3.00 glasses x 2 pair given.  These seemed  to work for her.  3.  Neck and trap pain:  Normal neuro exam.  Feel this is muscular.  Cyclobenzaprine 5-10 mg as needed every 8 hours.  No transport for PT, so will hold on that for now.  4.  Migraines:  Sumatriptan 50 mg to repeat in 2 hours if not relieved.  5.  Possibly enlarged thyroid:  TSH with fasting labs

## 2018-01-25 ENCOUNTER — Other Ambulatory Visit: Payer: Self-pay

## 2018-01-25 DIAGNOSIS — E010 Iodine-deficiency related diffuse (endemic) goiter: Secondary | ICD-10-CM

## 2018-01-25 DIAGNOSIS — Z79899 Other long term (current) drug therapy: Secondary | ICD-10-CM

## 2018-01-25 DIAGNOSIS — Z13220 Encounter for screening for lipoid disorders: Secondary | ICD-10-CM

## 2018-01-26 LAB — COMPREHENSIVE METABOLIC PANEL
ALT: 18 IU/L (ref 0–32)
AST: 25 IU/L (ref 0–40)
Albumin/Globulin Ratio: 1 — ABNORMAL LOW (ref 1.2–2.2)
Albumin: 3.8 g/dL (ref 3.5–5.5)
Alkaline Phosphatase: 83 IU/L (ref 39–117)
BUN/Creatinine Ratio: 10 (ref 9–23)
BUN: 12 mg/dL (ref 6–24)
Bilirubin Total: 0.2 mg/dL (ref 0.0–1.2)
CO2: 22 mmol/L (ref 20–29)
Calcium: 8.7 mg/dL (ref 8.7–10.2)
Chloride: 107 mmol/L — ABNORMAL HIGH (ref 96–106)
Creatinine, Ser: 1.23 mg/dL — ABNORMAL HIGH (ref 0.57–1.00)
GFR calc Af Amer: 57 mL/min/{1.73_m2} — ABNORMAL LOW (ref >59)
GFR calc non Af Amer: 50 mL/min/{1.73_m2} — ABNORMAL LOW (ref >59)
Globulin, Total: 3.9 g/dL (ref 1.5–4.5)
Glucose: 91 mg/dL (ref 65–99)
Potassium: 4.1 mmol/L (ref 3.5–5.2)
Sodium: 141 mmol/L (ref 134–144)
Total Protein: 7.7 g/dL (ref 6.0–8.5)

## 2018-01-26 LAB — CBC WITH DIFFERENTIAL/PLATELET
Basophils Absolute: 0 10*3/uL (ref 0.0–0.2)
Basos: 1 %
EOS (ABSOLUTE): 0.1 10*3/uL (ref 0.0–0.4)
Eos: 2 %
Hematocrit: 31.9 % — ABNORMAL LOW (ref 34.0–46.6)
Hemoglobin: 9.9 g/dL — ABNORMAL LOW (ref 11.1–15.9)
Immature Grans (Abs): 0 10*3/uL (ref 0.0–0.1)
Immature Granulocytes: 0 %
Lymphocytes Absolute: 1.6 10*3/uL (ref 0.7–3.1)
Lymphs: 39 %
MCH: 23.5 pg — ABNORMAL LOW (ref 26.6–33.0)
MCHC: 31 g/dL — ABNORMAL LOW (ref 31.5–35.7)
MCV: 76 fL — ABNORMAL LOW (ref 79–97)
Monocytes Absolute: 0.4 10*3/uL (ref 0.1–0.9)
Monocytes: 9 %
Neutrophils Absolute: 2 10*3/uL (ref 1.4–7.0)
Neutrophils: 49 %
Platelets: 174 10*3/uL (ref 150–450)
RBC: 4.21 x10E6/uL (ref 3.77–5.28)
RDW: 15.2 % (ref 12.3–15.4)
WBC: 4 10*3/uL (ref 3.4–10.8)

## 2018-01-26 LAB — LIPID PANEL W/O CHOL/HDL RATIO
Cholesterol, Total: 187 mg/dL (ref 100–199)
HDL: 78 mg/dL (ref >39)
LDL Calculated: 90 mg/dL (ref 0–99)
Triglycerides: 96 mg/dL (ref 0–149)
VLDL Cholesterol Cal: 19 mg/dL (ref 5–40)

## 2018-01-26 LAB — TSH: TSH: 1.72 u[IU]/mL (ref 0.450–4.500)

## 2018-03-02 ENCOUNTER — Other Ambulatory Visit: Payer: Self-pay

## 2018-04-23 ENCOUNTER — Encounter: Payer: Self-pay | Admitting: Internal Medicine

## 2018-06-15 ENCOUNTER — Other Ambulatory Visit: Payer: Self-pay

## 2018-06-15 MED ORDER — LISINOPRIL 10 MG PO TABS: ORAL_TABLET | ORAL | 2 refills | Status: DC

## 2018-07-31 ENCOUNTER — Encounter: Payer: Self-pay | Admitting: Internal Medicine

## 2018-09-28 ENCOUNTER — Other Ambulatory Visit: Payer: Self-pay

## 2018-09-28 MED ORDER — LISINOPRIL 10 MG PO TABS: ORAL_TABLET | ORAL | 1 refills | Status: DC

## 2018-12-08 ENCOUNTER — Other Ambulatory Visit: Payer: Self-pay | Admitting: Internal Medicine

## 2018-12-24 ENCOUNTER — Telehealth: Payer: Self-pay | Admitting: Internal Medicine

## 2018-12-24 NOTE — Telephone Encounter (Signed)
Patient called requesting labs to be able to check her kidneys function.   Please advise.

## 2018-12-24 NOTE — Telephone Encounter (Signed)
Spoke with patient. Acute OV scheduled for next monday

## 2018-12-31 ENCOUNTER — Ambulatory Visit (INDEPENDENT_AMBULATORY_CARE_PROVIDER_SITE_OTHER): Payer: Self-pay | Admitting: Internal Medicine

## 2018-12-31 ENCOUNTER — Other Ambulatory Visit: Payer: Self-pay

## 2018-12-31 ENCOUNTER — Encounter: Payer: Self-pay | Admitting: Internal Medicine

## 2018-12-31 VITALS — BP 130/80 | HR 82 | Resp 12 | Ht 64.5 in | Wt 194.0 lb

## 2018-12-31 DIAGNOSIS — R350 Frequency of micturition: Secondary | ICD-10-CM

## 2018-12-31 DIAGNOSIS — R358 Other polyuria: Secondary | ICD-10-CM

## 2018-12-31 DIAGNOSIS — M545 Low back pain, unspecified: Secondary | ICD-10-CM

## 2018-12-31 DIAGNOSIS — K5900 Constipation, unspecified: Secondary | ICD-10-CM

## 2018-12-31 LAB — POCT URINALYSIS DIPSTICK
Blood, UA: NEGATIVE
Glucose, UA: NEGATIVE
Ketones, UA: NEGATIVE
Leukocytes, UA: NEGATIVE
Nitrite, UA: NEGATIVE
Protein, UA: POSITIVE — AB
Spec Grav, UA: 1.02 (ref 1.010–1.025)
Urobilinogen, UA: 0.2 E.U./dL
pH, UA: 6.5 (ref 5.0–8.0)

## 2018-12-31 MED ORDER — LISINOPRIL 10 MG PO TABS: 10.0000 mg | ORAL_TABLET | Freq: Every day | ORAL | 11 refills | Status: DC

## 2018-12-31 MED ORDER — POLYETHYLENE GLYCOL 3350 17 G PO PACK: 17.0000 g | PACK | Freq: Every day | ORAL | 0 refills | Status: DC

## 2018-12-31 NOTE — Progress Notes (Signed)
    Subjective:    Patient ID: Marissa Warner, female   DOB: 03-05-1966, 53 y.o.   MRN: [id number]   HPI   1.  Urinary complaints for 4-5 months, maybe longer.  States she will have urinary urge and not be able to make it to the bathroom.  No odor with urine.  She does feel she has frequency. She has a good volume with each episode.  No burning on urination.  Is having some bilateral flank pain, which she feels started at about the same time. No history of diabetes.  She has had mildly elevated blood glucose in the past, but A1C was 5.4% at the same time in 07/2016 Also with abdominal bloating and tight abdomen with nausea over same time period.  At times has vomited.  Last episode of vomiting was about 3 days ago.  Feels this way every 4-5 days. No diarrhea, but may have hard stools with BM every 3 days.  Previously, with soft stools every 2 days about 5 months ago.   Used Miralax yesterday for constipation.  Had a soft formed stool this morning, though feels it was hard to pass.   Did have bright red blood on outside of stool and on tissue following a hard stool last week.   Drinking lots of water. May drink a [employer] twice weekly.  Does drink coffee as well. Does eat fruits and Vegetables, but only one serving per day.    Current Meds  Medication Sig  . acetaminophen (TYLENOL) 500 MG tablet Take 500 mg by mouth every 6 (six) hours as needed for mild pain.  . Glycerin-Polysorbate 80 (REFRESH DRY EYE THERAPY OP) Apply 2 drops to eye QID.  Marland Kitchen lisinopril (ZESTRIL) 10 MG tablet Take 1 tablet by mouth once daily   No Known Allergies   Review of Systems    Objective:   BP 130/80 (BP Location: Left Arm, Patient Position: Sitting, Cuff Size: Large)   Pulse 82   Resp 12   Ht 5' 4.5" (1.638 m)   Wt 194 lb (88 kg)   BMI 32.79 kg/m   Physical Exam  NAD Lungs:  CTA CV:  RRR without murmur or rub.  Radial and DP pulses normal and equal Abd:  S, + BS, No mass, No HSM.  No flank  tenderness or suprapubic tenderness.  Mild left lower quadrant tenderness.   Assessment & Plan   1.  Multiple urinary complaints and what sounds like constipation:    Frequency, possibly polyuria, low back pain/flank pain:  No obvious findings on exam, but does have history of constipation, which could lead to all of above. UA with protein.  Check urine culture. A1C with possibility of polyuria. Treat constipation with regular Miralax dosing.  2.  Anemia and mild CKD:  CBC, CMP--abnormal last year and has not followed up regarding these abnormalities  3.  Hypertension:  Refilled Lisinopril

## 2019-01-01 LAB — COMPREHENSIVE METABOLIC PANEL
ALT: 16 IU/L (ref 0–32)
AST: 24 IU/L (ref 0–40)
Albumin/Globulin Ratio: 1 — ABNORMAL LOW (ref 1.2–2.2)
Albumin: 3.8 g/dL (ref 3.8–4.9)
Alkaline Phosphatase: 83 IU/L (ref 39–117)
BUN/Creatinine Ratio: 9 (ref 9–23)
BUN: 11 mg/dL (ref 6–24)
Bilirubin Total: 0.2 mg/dL (ref 0.0–1.2)
CO2: 21 mmol/L (ref 20–29)
Calcium: 8.8 mg/dL (ref 8.7–10.2)
Chloride: 107 mmol/L — ABNORMAL HIGH (ref 96–106)
Creatinine, Ser: 1.25 mg/dL — ABNORMAL HIGH (ref 0.57–1.00)
GFR calc Af Amer: 56 mL/min/{1.73_m2} — ABNORMAL LOW (ref >59)
GFR calc non Af Amer: 49 mL/min/{1.73_m2} — ABNORMAL LOW (ref >59)
Globulin, Total: 3.8 g/dL (ref 1.5–4.5)
Glucose: 84 mg/dL (ref 65–99)
Potassium: 4 mmol/L (ref 3.5–5.2)
Sodium: 141 mmol/L (ref 134–144)
Total Protein: 7.6 g/dL (ref 6.0–8.5)

## 2019-01-01 LAB — CBC WITH DIFFERENTIAL/PLATELET
Basophils Absolute: 0 10*3/uL (ref 0.0–0.2)
Basos: 1 %
EOS (ABSOLUTE): 0.1 10*3/uL (ref 0.0–0.4)
Eos: 2 %
Hematocrit: 32.6 % — ABNORMAL LOW (ref 34.0–46.6)
Hemoglobin: 9.7 g/dL — ABNORMAL LOW (ref 11.1–15.9)
Immature Grans (Abs): 0 10*3/uL (ref 0.0–0.1)
Immature Granulocytes: 0 %
Lymphocytes Absolute: 1.5 10*3/uL (ref 0.7–3.1)
Lymphs: 34 %
MCH: 22.8 pg — ABNORMAL LOW (ref 26.6–33.0)
MCHC: 29.8 g/dL — ABNORMAL LOW (ref 31.5–35.7)
MCV: 77 fL — ABNORMAL LOW (ref 79–97)
Monocytes Absolute: 0.5 10*3/uL (ref 0.1–0.9)
Monocytes: 11 %
Neutrophils Absolute: 2.4 10*3/uL (ref 1.4–7.0)
Neutrophils: 52 %
Platelets: 191 10*3/uL (ref 150–450)
RBC: 4.26 x10E6/uL (ref 3.77–5.28)
RDW: 15.1 % (ref 11.7–15.4)
WBC: 4.6 10*3/uL (ref 3.4–10.8)

## 2019-01-02 LAB — URINE CULTURE

## 2019-03-05 ENCOUNTER — Encounter: Payer: Self-pay | Admitting: Internal Medicine

## 2019-03-05 ENCOUNTER — Ambulatory Visit: Payer: Self-pay | Admitting: Internal Medicine

## 2019-03-05 ENCOUNTER — Other Ambulatory Visit: Payer: Self-pay

## 2019-03-05 VITALS — BP 116/82 | HR 80 | Resp 12 | Ht 64.5 in | Wt 194.0 lb

## 2019-03-05 DIAGNOSIS — K5900 Constipation, unspecified: Secondary | ICD-10-CM | POA: Insufficient documentation

## 2019-03-05 DIAGNOSIS — Z23 Encounter for immunization: Secondary | ICD-10-CM

## 2019-03-05 DIAGNOSIS — K219 Gastro-esophageal reflux disease without esophagitis: Secondary | ICD-10-CM | POA: Insufficient documentation

## 2019-03-05 DIAGNOSIS — D649 Anemia, unspecified: Secondary | ICD-10-CM

## 2019-03-05 MED ORDER — METAMUCIL SMOOTH TEXTURE 58.6 % PO POWD: 1.0000 | Freq: Every day | ORAL | 12 refills | Status: DC

## 2019-03-05 MED ORDER — POLYETHYLENE GLYCOL 3350 17 G PO PACK: PACK | ORAL | 0 refills | Status: DC

## 2019-03-05 MED ORDER — OMEPRAZOLE 40 MG PO CPDR: 40.0000 mg | DELAYED_RELEASE_CAPSULE | Freq: Every day | ORAL | 6 refills | Status: DC

## 2019-03-05 NOTE — Progress Notes (Signed)
    Subjective:    Patient ID: Marissa Warner, female   DOB: 1966/05/20, 53 y.o.   MRN: [id number]   HPI   1.  Urinary and stool complaints:  Urine culture negative for infection.  She has been using Miralax daily every day.  States her hard stools are still a problem. She is still passing hard pellets of stool.   After straining, she can have BRB on tissue.   She is drinking 4 glasses of water daily.   She is eating 2 servings of veggies daily She is eating bananas--discussed may constipate.  She is also vomiting--though states this was before her last visit.  She feels full and bloated and vomits.  Generally when gets up in morning.  May vomit again at night.  Does have acid in throat in morning. She has not had weight loss with this over past year. Taking Ranitidine 150 mg twice daily from previous clinic.  2.  Anemia:  She did start ferrous gluconate 324 mg once daily.  Has not had a period since 2004 with hysterectomy.  No definite melena.  Some BRBPR with hard stools and straining.  3.  Pulling in right medial knee area--describes a pulling in her soft tissues.  Notes this more so with pushing against something with her foot.  Current Meds  Medication Sig  . acetaminophen (TYLENOL) 500 MG tablet Take 500 mg by mouth every 6 (six) hours as needed for mild pain.  . cyclobenzaprine (FLEXERIL) 10 MG tablet 1/2 to 1 tab by mouth every 8 hours as needed for shoulder and neck pain  . Ferrous Gluconate 324 (37.5 Fe) MG TABS Take by mouth 2 (two) times daily.  . Glycerin-Polysorbate 80 (REFRESH DRY EYE THERAPY OP) Apply 2 drops to eye QID.  Marland Kitchen lisinopril (ZESTRIL) 10 MG tablet Take 1 tablet (10 mg total) by mouth daily.  . polyethylene glycol (MIRALAX / GLYCOLAX) 17 g packet Take 17 g by mouth daily.  . ranitidine (ZANTAC) 150 MG tablet Take 150 mg by mouth daily.  . SUMAtriptan (IMITREX) 50 MG tablet 1 tab by mouth for migraine headache, may repeat once in 2 hours if not relieved  .  Vitamin D, Ergocalciferol, (DRISDOL) 50000 units CAPS capsule Take 50,000 Units by mouth every 7 (seven) days.    No Known Allergies   Review of Systems    Objective:   Ht 5' 4.5" (1.638 m)   BMI 32.79 kg/m   Physical Exam  NAD HEENT: PERRL, EOMI Neck:  Supple, No adenopathy, no thyromegaly Chest:  CTA CV:  RRR without murmur or rub.  Radial pulses normal and equal Abd:  S, NT, No HSM or mass. + BS Rectal:  No significant stool in rectal vault, what is on tip of finger is liquidy.  Light brown stool, guaiac negative.   Assessment & Plan  1.  Constipation:  Increase Miralax to 17 g in 8 oz water twice daily.  Metamucil 1 packet in 8 oz water daily.   Increase to 5 veggie servings daily and 2 fruit servings. Drink 8 eight oz glasses of water daily.  2.  GERD:  Switch to Omeprazole 40 mg daily on empty stomach. GERD precautions.  3.  Microcytic anemia:  Iron studies, B12, folate.  Continue iron supplementation.  4.  Right leg complaints:  Readdress at follow up.  5.  HM:  Tdap.  Flu clinic on Thursday.

## 2019-03-05 NOTE — Patient Instructions (Signed)
Flu clinics on Oct 15 8:30 a.m. to 11:30 a.m.    Drink 8 glasses of water daily

## 2019-03-06 LAB — FOLATE: Folate: 7.5 ng/mL (ref >3.0)

## 2019-03-06 LAB — VITAMIN B12: Vitamin B-12: 875 pg/mL (ref 232–1245)

## 2019-03-06 LAB — IRON AND TIBC
Iron Saturation: 27 % (ref 15–55)
Iron: 64 ug/dL (ref 27–159)
Total Iron Binding Capacity: 237 ug/dL — ABNORMAL LOW (ref 250–450)
UIBC: 173 ug/dL (ref 131–425)

## 2019-03-06 LAB — TSH: TSH: 1.24 u[IU]/mL (ref 0.450–4.500)

## 2019-03-28 NOTE — Addendum Note (Signed)
Addended by: Marcelino Duster on: 03/28/2019 11:38 AM   Modules accepted: Orders

## 2019-05-06 ENCOUNTER — Ambulatory Visit: Payer: Self-pay | Admitting: Internal Medicine

## 2019-05-09 ENCOUNTER — Ambulatory Visit: Payer: Self-pay | Admitting: Internal Medicine

## 2019-06-03 ENCOUNTER — Ambulatory Visit (INDEPENDENT_AMBULATORY_CARE_PROVIDER_SITE_OTHER): Payer: Self-pay | Admitting: Internal Medicine

## 2019-06-03 ENCOUNTER — Encounter: Payer: Self-pay | Admitting: Internal Medicine

## 2019-06-03 ENCOUNTER — Other Ambulatory Visit: Payer: Self-pay

## 2019-06-03 VITALS — BP 130/80 | HR 84 | Resp 12 | Ht 64.5 in | Wt 193.0 lb

## 2019-06-03 DIAGNOSIS — D649 Anemia, unspecified: Secondary | ICD-10-CM

## 2019-06-03 DIAGNOSIS — K219 Gastro-esophageal reflux disease without esophagitis: Secondary | ICD-10-CM

## 2019-06-03 DIAGNOSIS — G8929 Other chronic pain: Secondary | ICD-10-CM

## 2019-06-03 DIAGNOSIS — M25561 Pain in right knee: Secondary | ICD-10-CM

## 2019-06-03 DIAGNOSIS — K5900 Constipation, unspecified: Secondary | ICD-10-CM

## 2019-06-03 DIAGNOSIS — I10 Essential (primary) hypertension: Secondary | ICD-10-CM

## 2019-06-03 MED ORDER — DICLOFENAC SODIUM 1 % EX GEL: 4.0000 g | Freq: Four times a day (QID) | CUTANEOUS | 11 refills | Status: AC

## 2019-06-03 NOTE — Progress Notes (Signed)
Subjective:    Patient ID: Marissa Warner, female   DOB: [birth year], 54 y.o.   MRN: [id number]   HPI   1.  Anemia:  Never picked up guaiac cards to test stool for blood.   She shares today that she underwent colonoscopy in 2016 for iron deficiency anemia at [facility].  I was able to find this in Care Everywhere tab and was normal save for mild diverticulosis and internal hemorrhoids.   She is taking the ferrous gluconate once daily.   Taking Multivitamin from [facility] daily as well.  2.  Still with intermittent constipation and straining. Did get Metamucil and using 3 times weekly.  Was using almost every day, but when improved, she decreased usage.  She is not using the Mirilax, but did obtain. Drinking a lot of water daily.   When she strains, she can have blood on tissue.  3. GERD:  Still with acid in mouth and nausea in the morning and after she eats. Generally, this is only in the morning.  Does not have HOB elevated--sleeping on 2 pillows. Taking Omeprazole with meal in the morning. Not much in way of caffeine.    4.  Right knee:  Still with pulling on medial knee.  When goes to stand, feels this the most.  She denies any injury.   States arthritis is prevalent in her family. Feels her knee will give out due to discomfort and lack of stability. Takes Tylenol 1000 mg twice daily at times, but does not really note any improvement.  Has CKD, mild, so has been discouraged to use NSAIDS.  5.  Hypertension:  Taking meds.  Current Meds  Medication Sig  . acetaminophen (TYLENOL) 500 MG tablet Take 500 mg by mouth every 6 (six) hours as needed for mild pain.  . cyclobenzaprine (FLEXERIL) 10 MG tablet 1/2 to 1 tab by mouth every 8 hours as needed for shoulder and neck pain  . Ferrous Gluconate 324 (37.5 Fe) MG TABS Take by mouth 2 (two) times daily.  . Glycerin-Polysorbate 80 (REFRESH DRY EYE THERAPY OP) Apply 2 drops to eye QID.  Marland Kitchen guaiFENesin (MUCINEX) 600 MG 12 hr tablet Take by  mouth 2 (two) times daily.  Marland Kitchen lisinopril (ZESTRIL) 10 MG tablet Take 1 tablet (10 mg total) by mouth daily.  Marland Kitchen omeprazole (PRILOSEC) 40 MG capsule Take 1 capsule (40 mg total) by mouth daily.  . polyethylene glycol (MIRALAX / GLYCOLAX) 17 g packet Mix 17 g in 8 oz water twice daily  . psyllium (METAMUCIL SMOOTH TEXTURE) 58.6 % powder Take 1 packet by mouth daily.  . SUMAtriptan (IMITREX) 50 MG tablet 1 tab by mouth for migraine headache, may repeat once in 2 hours if not relieved  . Vitamin D, Ergocalciferol, (DRISDOL) 50000 units CAPS capsule Take 50,000 Units by mouth every 7 (seven) days.   No Known Allergies   Review of Systems    Objective:   BP 130/80 (BP Location: Left Arm, Patient Position: Sitting, Cuff Size: Normal)   Pulse 84   Resp 12   Ht 5' 4.5" (1.638 m)   Wt 193 lb (87.5 kg)   BMI 32.62 kg/m   Physical Exam  NAD Lungs:  CTA CV:   RRR without murmur or rub.  Radial and DP pulses normal and equal Abd:  S, NT, No HSM or mass, + BS.  No suprapubic or flank tenderness. LE:  No edema.  Right knee with decreased flexion with ROM and  moves knee slowly to flex and extend due to pain.   Tender most significantly over medial and collateral ligaments, but less so along entire joint line both medially, laterally, and posterior knee as well.  Increased pain with compression of patella against anterior joint. No laxity on stress of cruciate and collateral ligaments, but increased pain with stress of all. No definite effusion, but due to weight, somewhat difficult to evaluate. Limps when initially bears weight and then gait eventually smoothens.   Assessment & Plan   1.  Anemia:  Had non-concerning colonoscopy just 4 years ago for same diagnosis at [facility] and reusults in [place] tab. CBC to see if iron supplementation has helped.  2.  Constipation:  Encouraged her to take her Metamucil daily to avoid constipation.  Hold on Miralax for now if Metamucil and increased  fluids alone treat this adequately.  3.  Urinary frequency:  Discussed her blood glucose and UA do not show problems with DM.  Her urine culture in past has been negative for infection.  Likely just due to her water intake.  4.  GERD:  Discussed diet, elevation of HOB and taking Omeprazole on empty stomach 1/2 hour before breakfast.  5.  Hypertension:  Controlled  6.  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  Diclofenac gel as needed.  Avoid oral NSAIDs with CKD. Referral to PT for strengthening. Xray of knee--Cherice to call [facility] to get an idea of cost for patient.

## 2019-06-04 LAB — CBC WITH DIFFERENTIAL/PLATELET
Basophils Absolute: 0.1 10*3/uL (ref 0.0–0.2)
Basos: 1 %
EOS (ABSOLUTE): 0.1 10*3/uL (ref 0.0–0.4)
Eos: 1 %
Hematocrit: 35.3 % (ref 34.0–46.6)
Hemoglobin: 10.7 g/dL — ABNORMAL LOW (ref 11.1–15.9)
Immature Grans (Abs): 0 10*3/uL (ref 0.0–0.1)
Immature Granulocytes: 0 %
Lymphocytes Absolute: 1.7 10*3/uL (ref 0.7–3.1)
Lymphs: 34 %
MCH: 23.5 pg — ABNORMAL LOW (ref 26.6–33.0)
MCHC: 30.3 g/dL — ABNORMAL LOW (ref 31.5–35.7)
MCV: 77 fL — ABNORMAL LOW (ref 79–97)
Monocytes Absolute: 0.5 10*3/uL (ref 0.1–0.9)
Monocytes: 9 %
Neutrophils Absolute: 2.9 10*3/uL (ref 1.4–7.0)
Neutrophils: 55 %
Platelets: 220 10*3/uL (ref 150–450)
RBC: 4.56 x10E6/uL (ref 3.77–5.28)
RDW: 14.5 % (ref 11.7–15.4)
WBC: 5.2 10*3/uL (ref 3.4–10.8)

## 2019-08-08 ENCOUNTER — Other Ambulatory Visit: Payer: Self-pay | Admitting: Gastroenterology

## 2019-08-28 ENCOUNTER — Other Ambulatory Visit: Payer: Self-pay

## 2019-08-28 ENCOUNTER — Ambulatory Visit (INDEPENDENT_AMBULATORY_CARE_PROVIDER_SITE_OTHER): Payer: Self-pay | Admitting: Internal Medicine

## 2019-08-28 ENCOUNTER — Encounter: Payer: Self-pay | Admitting: Internal Medicine

## 2019-08-28 VITALS — BP 132/88 | HR 78 | Resp 12 | Ht 64.5 in | Wt 194.0 lb

## 2019-08-28 DIAGNOSIS — M75101 Unspecified rotator cuff tear or rupture of right shoulder, not specified as traumatic: Secondary | ICD-10-CM

## 2019-08-28 DIAGNOSIS — G8929 Other chronic pain: Secondary | ICD-10-CM

## 2019-08-28 DIAGNOSIS — K5900 Constipation, unspecified: Secondary | ICD-10-CM

## 2019-08-28 DIAGNOSIS — M25561 Pain in right knee: Secondary | ICD-10-CM

## 2019-08-28 NOTE — Progress Notes (Signed)
    Subjective:    Patient ID: Marissa Warner, female   DOB: 24-Jul-1965, 54 y.o.   MRN: [id number]   HPI   1.  Right knee pain:  Did not get the xray of knee as recommended.  She did not understand she did not need an appt.  Discussed today can go whenever she has the time.   She is working with PT virtually.  States [facility] set her up with a walker and that has helped.   She is using the Diclofenac gel, twice daily and feels it helps as well.    2.  Right arm pain:  Started in December, but has worsened with time. Mainly has this when lying down at night to sleep.  Describes a sharp pain on her lateral shoulder that radiates down her arm.   Has tried Diclofenac gel on her arm with limited improvement.  Current Meds  Medication Sig  . acetaminophen (TYLENOL) 500 MG tablet Take 500 mg by mouth every 6 (six) hours as needed for mild pain.  . cyclobenzaprine (FLEXERIL) 10 MG tablet 1/2 to 1 tab by mouth every 8 hours as needed for shoulder and neck pain  . diclofenac Sodium (VOLTAREN) 1 % GEL Apply 4 g topically 4 (four) times daily.  . Ferrous Gluconate 324 (37.5 Fe) MG TABS Take by mouth 2 (two) times daily.  . Glycerin-Polysorbate 80 (REFRESH DRY EYE THERAPY OP) Apply 2 drops to eye QID.  Marland Kitchen guaiFENesin (MUCINEX) 600 MG 12 hr tablet Take by mouth 2 (two) times daily.  Marland Kitchen lisinopril (ZESTRIL) 10 MG tablet Take 1 tablet (10 mg total) by mouth daily.  Marland Kitchen omeprazole (PRILOSEC) 40 MG capsule Take 1 capsule (40 mg total) by mouth daily.  . psyllium (METAMUCIL SMOOTH TEXTURE) 58.6 % powder Take 1 packet by mouth daily.  . SUMAtriptan (IMITREX) 50 MG tablet 1 tab by mouth for migraine headache, may repeat once in 2 hours if not relieved  . Vitamin D, Ergocalciferol, (DRISDOL) 50000 units CAPS capsule Take 50,000 Units by mouth every 7 (seven) days.   No Known Allergies   Review of Systems    Objective:   BP 132/88 (BP Location: Left Arm, Patient Position: Sitting, Cuff Size: Large)   Pulse 78    Resp 12   Ht 5' 4.5" (1.638 m)   Wt 194 lb (88 kg)   BMI 32.79 kg/m   Physical Exam  NAD Lungs:  CTA CV:  RRR without murmur or rub.  Radial pulses normal and equal MS:  Right Knee with perhaps increased flexion ROM Right shoulder with tenderness over subacromial bursa, difficulty with full abduction and flexion.  Mild tenderness over trap/AC/CC.  Decreased ability to oppose downward force with shoulder at 90 degrees abduction and internal rotation.    Assessment & Plan   1.  Encouraged to get her COVID 19 vaccine.   2.  Right knee pain:  To continue with PT.  Encouraged her to get Xray.  Diclofenac topically  3.  Right Rotator Cuff Tendinitis:  Not sure if this has come up due to the way she is using her walker.  Will ask PT to work with her regarding this as well.

## 2019-09-02 ENCOUNTER — Ambulatory Visit: Payer: Self-pay | Admitting: Internal Medicine

## 2019-09-05 ENCOUNTER — Other Ambulatory Visit (HOSPITAL_COMMUNITY)
Admission: RE | Admit: 2019-09-05 | Discharge: 2019-09-05 | Disposition: A | Discharge status: Home / Self Care | Payer: HRSA Program | Source: Ambulatory Visit | Attending: Gastroenterology | Admitting: Gastroenterology

## 2019-09-05 DIAGNOSIS — Z20822 Contact with and (suspected) exposure to covid-19: Secondary | ICD-10-CM | POA: Diagnosis not present

## 2019-09-05 DIAGNOSIS — Z01812 Encounter for preprocedural laboratory examination: Secondary | ICD-10-CM | POA: Diagnosis present

## 2019-09-05 LAB — SARS CORONAVIRUS 2 (TAT 6-24 HRS): SARS Coronavirus 2: NEGATIVE

## 2019-09-09 ENCOUNTER — Ambulatory Visit (HOSPITAL_COMMUNITY): Payer: Self-pay | Admitting: Certified Registered"

## 2019-09-09 ENCOUNTER — Other Ambulatory Visit: Payer: Self-pay

## 2019-09-09 ENCOUNTER — Encounter (HOSPITAL_COMMUNITY)
Admission: RE | Disposition: A | Discharge status: Home / Self Care | Payer: Self-pay | Source: Home / Self Care | Attending: Gastroenterology

## 2019-09-09 ENCOUNTER — Ambulatory Visit (HOSPITAL_COMMUNITY)
Admission: RE | Admit: 2019-09-09 | Discharge: 2019-09-09 | Disposition: A | Discharge status: Home / Self Care | Payer: Self-pay | Attending: Gastroenterology | Admitting: Gastroenterology

## 2019-09-09 ENCOUNTER — Encounter (HOSPITAL_COMMUNITY): Payer: Self-pay | Admitting: Gastroenterology

## 2019-09-09 DIAGNOSIS — N189 Chronic kidney disease, unspecified: Secondary | ICD-10-CM | POA: Insufficient documentation

## 2019-09-09 DIAGNOSIS — R110 Nausea: Secondary | ICD-10-CM | POA: Insufficient documentation

## 2019-09-09 DIAGNOSIS — K3189 Other diseases of stomach and duodenum: Secondary | ICD-10-CM | POA: Insufficient documentation

## 2019-09-09 DIAGNOSIS — I129 Hypertensive chronic kidney disease with stage 1 through stage 4 chronic kidney disease, or unspecified chronic kidney disease: Secondary | ICD-10-CM | POA: Insufficient documentation

## 2019-09-09 DIAGNOSIS — D509 Iron deficiency anemia, unspecified: Secondary | ICD-10-CM | POA: Insufficient documentation

## 2019-09-09 DIAGNOSIS — G43909 Migraine, unspecified, not intractable, without status migrainosus: Secondary | ICD-10-CM | POA: Insufficient documentation

## 2019-09-09 DIAGNOSIS — K5730 Diverticulosis of large intestine without perforation or abscess without bleeding: Secondary | ICD-10-CM | POA: Insufficient documentation

## 2019-09-09 DIAGNOSIS — F17220 Nicotine dependence, chewing tobacco, uncomplicated: Secondary | ICD-10-CM | POA: Insufficient documentation

## 2019-09-09 DIAGNOSIS — K449 Diaphragmatic hernia without obstruction or gangrene: Secondary | ICD-10-CM | POA: Insufficient documentation

## 2019-09-09 DIAGNOSIS — K648 Other hemorrhoids: Secondary | ICD-10-CM | POA: Insufficient documentation

## 2019-09-09 DIAGNOSIS — K219 Gastro-esophageal reflux disease without esophagitis: Secondary | ICD-10-CM | POA: Insufficient documentation

## 2019-09-09 DIAGNOSIS — Z79899 Other long term (current) drug therapy: Secondary | ICD-10-CM | POA: Insufficient documentation

## 2019-09-09 HISTORY — PX: COLONOSCOPY WITH PROPOFOL: SHX5780

## 2019-09-09 HISTORY — PX: BIOPSY: SHX5522

## 2019-09-09 HISTORY — PX: ESOPHAGOGASTRODUODENOSCOPY (EGD) WITH PROPOFOL: SHX5813

## 2019-09-09 SURGERY — COLONOSCOPY WITH PROPOFOL
Anesthesia: Monitor Anesthesia Care

## 2019-09-09 MED ORDER — SODIUM CHLORIDE 0.9 % IV SOLN: INTRAVENOUS | Status: DC

## 2019-09-09 MED ORDER — PROPOFOL 10 MG/ML IV BOLUS
INTRAVENOUS | Status: AC
  Filled 2019-09-09: qty 20

## 2019-09-09 MED ORDER — LIDOCAINE HCL (CARDIAC) PF 100 MG/5ML IV SOSY
PREFILLED_SYRINGE | INTRAVENOUS | Status: DC | PRN
  Administered 2019-09-09: 50 mg via INTRATRACHEAL

## 2019-09-09 MED ORDER — PROPOFOL 500 MG/50ML IV EMUL
INTRAVENOUS | Status: DC | PRN
  Administered 2019-09-09 (×3): 20 mg via INTRAVENOUS

## 2019-09-09 MED ORDER — PROPOFOL 500 MG/50ML IV EMUL
INTRAVENOUS | Status: DC | PRN
  Administered 2019-09-09: 135 ug/kg/min via INTRAVENOUS

## 2019-09-09 MED ORDER — PROPOFOL 500 MG/50ML IV EMUL
INTRAVENOUS | Status: AC
  Filled 2019-09-09: qty 50

## 2019-09-09 MED ORDER — LACTATED RINGERS IV SOLN
INTRAVENOUS | Status: DC
  Administered 2019-09-09: 1000 mL via INTRAVENOUS

## 2019-09-09 SURGICAL SUPPLY — 24 items

## 2019-09-09 NOTE — H&P (Signed)
History of Present Illness  General:           53 year old female was referred for GERD, bloating, microcytic anemia for consideration for EGD and colonoscopy.        Labs on EPIC from 1/21 showed Hb 10.7(9.7 in 12/2018), MCV 77, Plt 220. Iron panel from 02/2019 showed saturation of 27%, TIBC 237, CMP from 12/2018 showed GFR of 56, Cr 1.25, normal LFTs.        Notes from care everywhere revealed a colonoscopy from 11/16 which showed internal hemorrhoids and diverticulosis and EGD which was normal except erythematous duodenopathy with unremarkable SB biopsies.        She c/ o headache, since yesterday, she has chronic migraines.        She is post menopausal since 2004.        She reports that she has chronic anemia.        She takes metamucil daily,she has blood in stool, especially when she strains, few times a week, it can be large, mostly bright red in color, she has noted black stools, she takes iron daily for the past several years.        She denies unintentional weight loss.        She has nausea, she also has vomiting intermittently 3/week.        Denies vomiting.        She has acid reflux but doesn't take any medications.        She denies difficulty or pain on swallowing.        There is no family history of colon cancer.  Current Medications  TakingImitrex(SUMAtriptan) 25 MG Tablet 1 tablet at least 2 hours between doses as needed Orally Twice a day    Iron 325 (65 Fe) MG Tablet 1 tablet Orally Once a day    Lisinopril 10 MG Tablet 1 tablet Orally Once a day    Metamucil(Psyllium) 48.57 % Powder as directed Orally    Tylenol 1 tab Oral    Vitamin D 25 MCG (1000 UT) Tablet 1 tablet Orally Once a day    Refresh(Carboxymethylcellulose Sodium) 1.4-0.6 % Solution as directed Ophthalmic    Medication List reviewed and reconciled with the patient          Past Medical History       HTN.        Anemic .        Migraines.        CKD.       Surgical History  Hernia  Repair   C-section   gallbladder Removal   Hysterectomy       Family History  No Family History of Colon Cancer, Polyps, or Liver Disease.      Social History  General:   Tobacco use       cigarettes:  Uses tobacco in other forms Chewing Tobacco no Alcohol.  no Recreational drug use.     Allergies  N.K.D.A.      Hospitalization/Major Diagnostic Procedure  none 06/2019      Review of Systems  GI PROCEDURE:          no Pacemaker/ AICD, no.  no Artificial heart valves.  no MI/heart attack.  no Abnormal heart rhythm.  no Angina.  no CVA.  Hypertension  YES.  no Hypotension.  no Asthma, COPD.  no Sleep apnea.  no Seizure disorders.  no Artificial joints.  no Severe DJD.  no Diabetes.  Significant headaches  YES.  no Vertigo.  no Depression/anxiety.  no Abnormal bleeding.  Kidney Disease  yes,  CKD.  no Liver disease, no.  no Chance of pregnancy.  Blood transfusion  yes.  no Method of Birth Control.  no Birth control pills.             Vital Signs  Wt 193, Ht 65, BMI 32.11 Pt states her wt is 193 lbs. ST,MA.    Examination  Gastroenterology::        RESPIRATORY  able to speak in full sentences, not in respiratory distress.         NEURO:  alert,oriented to time, place and person.      Assessments    1. Microcytic anemia - D50.9 (Primary)    Treatment  1. Microcytic anemia        IMAGING: Colonoscopy      IMAGING: Esophagoscopy Notes: Recommended diagnostic endoscopy and colonoscopy.  We discussed about possibility of AVM, erosions, ulcers, bleeding lesion or colonic mass. She understands and verbalizes consent. She will be given written instructions, prescription for preparation and will be scheduled for the same.

## 2019-09-09 NOTE — Anesthesia Postprocedure Evaluation (Signed)
Anesthesia Post Note  Patient: Marybelle Giraldo  Procedure(s) Performed: COLONOSCOPY WITH PROPOFOL (N/A ) ESOPHAGOGASTRODUODENOSCOPY (EGD) WITH PROPOFOL (N/A ) BIOPSY     Patient location during evaluation: PACU Anesthesia Type: MAC Level of consciousness: awake and alert Pain management: pain level controlled Vital Signs Assessment: post-procedure vital signs reviewed and stable Respiratory status: spontaneous breathing, nonlabored ventilation, respiratory function stable and patient connected to nasal cannula oxygen Cardiovascular status: stable and blood pressure returned to baseline Postop Assessment: no apparent nausea or vomiting Anesthetic complications: no    Last Vitals:  Vitals:   09/09/19 1200 09/09/19 1210  BP: 132/76 138/86  Pulse: 70 63  Resp: 17 16  Temp:    SpO2: 100% 99%    Last Pain:  Vitals:   09/09/19 1210  TempSrc:   PainSc: 0-No pain                 Effie Berkshire

## 2019-09-09 NOTE — Op Note (Signed)
[facility] Patient Name: Marissa Warner Procedure Date: 09/09/2019 MRN: [id number] Attending MD: Ronnette Juniper , MD Date of Birth: 20-Apr-1966 CSN: [id number] Age: 53 Admit Type: Outpatient Procedure:                Colonoscopy Indications:              Last colonoscopy: November 2016, Unexplained iron                            deficiency anemia Providers:                Ronnette Juniper, MD, Cleda Daub, RN, Janeece Agee,                            Technician Referring MD:             Mack Hook, MD Medicines:                Monitored Anesthesia Care Complications:            No immediate complications. Estimated Blood Loss:     Estimated blood loss: none. Procedure:                Pre-Anesthesia Assessment:                           - Prior to the procedure, a History and Physical                            was performed, and patient medications and                            allergies were reviewed. The patient's tolerance of                            previous anesthesia was also reviewed. The risks                            and benefits of the procedure and the sedation                            options and risks were discussed with the patient.                            All questions were answered, and informed consent                            was obtained. Prior Anticoagulants: The patient has                            taken no previous anticoagulant or antiplatelet                            agents. ASA Grade Assessment: II - A patient with                            mild systemic  disease. After reviewing the risks                            and benefits, the patient was deemed in                            satisfactory condition to undergo the procedure.                           After obtaining informed consent, the colonoscope                            was passed under direct vision. Throughout the                            procedure, the patient's  blood pressure, pulse, and                            oxygen saturations were monitored continuously. The                            [id number] Olympus pediatric colonscope                            was introduced through the anus and advanced to the                            the terminal ileum. The colonoscopy was performed                            without difficulty. The patient tolerated the                            procedure well. The quality of the bowel                            preparation was adequate to identify polyps 6 mm                            and larger in size. Scope In: 11:28:34 AM Scope Out: 11:38:30 AM Scope Withdrawal Time: 0 hours 7 minutes 33 seconds  Total Procedure Duration: 0 hours 9 minutes 56 seconds  Findings:      The perianal and digital rectal examinations were normal.      A few small-mouthed diverticula were found in the sigmoid colon and       descending colon.      The terminal ileum appeared normal.      The exam was otherwise without abnormality on direct and retroflexion       views. Impression:               - Diverticulosis in the sigmoid colon and in the                            descending colon.                           -  The examined portion of the ileum was normal.                           - The examination was otherwise normal on direct                            and retroflexion views.                           - No specimens collected. Moderate Sedation:      Patient did not receive moderate sedation for this procedure, but       instead received monitored anesthesia care. Recommendation:           - Patient has a contact number available for                            emergencies. The signs and symptoms of potential                            delayed complications were discussed with the                            patient. Return to normal activities tomorrow.                            Written discharge  instructions were provided to the                            patient.                           - Resume regular diet.                           - Continue present medications.                           - Repeat colonoscopy in 10 years for screening                            purposes. Procedure Code(s):        --- Professional ---                           [id number], Colonoscopy, flexible; diagnostic, including                            collection of specimen(s) by brushing or washing,                            when performed (separate procedure) Diagnosis Code(s):        --- Professional ---                           D50.9, Iron deficiency anemia, unspecified  K57.30, Diverticulosis of large intestine without                            perforation or abscess without bleeding CPT copyright 2019 American [employer] Association. All rights reserved. The codes documented in this report are preliminary and upon coder review may  be revised to meet current compliance requirements. Ronnette Juniper, MD 09/09/2019 11:45:42 AM This report has been signed electronically. Number of Addenda: 0

## 2019-09-09 NOTE — Transfer of Care (Signed)
Immediate Anesthesia Transfer of Care Note  Patient: Marissa Warner  Procedure(s) Performed: COLONOSCOPY WITH PROPOFOL (N/A ) ESOPHAGOGASTRODUODENOSCOPY (EGD) WITH PROPOFOL (N/A ) BIOPSY  Patient Location: Endoscopy Unit  Anesthesia Type:MAC  Level of Consciousness: awake, oriented and patient cooperative  Airway & Oxygen Therapy: Patient Spontanous Breathing and Patient connected to face mask oxygen  Post-op Assessment: Report given to RN and Post -op Vital signs reviewed and stable  Post vital signs: Reviewed and stable  Last Vitals:  Vitals Value Taken Time  BP    Temp    Pulse 87 09/09/19 1149  Resp 29 09/09/19 1149  SpO2 100 % 09/09/19 1149  Vitals shown include unvalidated device data.  Last Pain:  Vitals:   09/09/19 1102  TempSrc: Oral  PainSc: 0-No pain         Complications: No apparent anesthesia complications

## 2019-09-09 NOTE — Discharge Instructions (Signed)
YOU HAD AN ENDOSCOPIC PROCEDURE TODAY: Refer to the procedure report and other information in the discharge instructions given to you for any specific questions about what was found during the examination. If this information does not answer your questions, please call [facility] office at [phone] to clarify.   YOU SHOULD EXPECT: Some feelings of bloating in the abdomen. Passage of more gas than usual. Walking can help get rid of the air that was put into your GI tract during the procedure and reduce the bloating. If you had a lower endoscopy (such as a colonoscopy or flexible sigmoidoscopy) you may notice spotting of blood in your stool or on the toilet paper. Some abdominal soreness may be present for a day or two, also.  DIET: Your first meal following the procedure should be a light meal and then it is ok to progress to your normal diet. A half-sandwich or bowl of soup is an example of a good first meal. Heavy or fried foods are harder to digest and may make you feel nauseous or bloated. Drink plenty of fluids but you should avoid alcoholic beverages for 24 hours.    ACTIVITY: Your care partner should take you home directly after the procedure. You should plan to take it easy, moving slowly for the rest of the day. You can resume normal activity the day after the procedure however YOU SHOULD NOT DRIVE, use power tools, machinery or perform tasks that involve climbing or major physical exertion for 24 hours (because of the sedation medicines used during the test).   SYMPTOMS TO REPORT IMMEDIATELY: A gastroenterologist can be reached at any hour. Please call [phone]  for any of the following symptoms:  . Following lower endoscopy (colonoscopy, flexible sigmoidoscopy) Excessive amounts of blood in the stool  Significant tenderness, worsening of abdominal pains  Swelling of the abdomen that is new, acute  Fever of 100 or higher  . Following upper endoscopy (EGD, EUS, ERCP, esophageal  dilation) Vomiting of blood or coffee ground material  New, significant abdominal pain  New, significant chest pain or pain under the shoulder blades  Painful or persistently difficult swallowing  New shortness of breath  Black, tarry-looking or red, bloody stools  FOLLOW UP:  If any biopsies were taken you will be contacted by phone or by letter within the next 1-3 weeks. Call [phone]  if you have not heard about the biopsies in 3 weeks.  Please also call with any specific questions about appointments or follow up tests.  

## 2019-09-09 NOTE — Anesthesia Preprocedure Evaluation (Signed)
Anesthesia Evaluation  Patient identified by MRN, date of birth, ID band Patient awake    Reviewed: Allergy & Precautions, NPO status , Patient's Chart, lab work & pertinent test results  Airway Mallampati: I  TM Distance: >3 FB Neck ROM: Full    Dental  (+) Edentulous Upper, Edentulous Lower   Pulmonary neg pulmonary ROS,    breath sounds clear to auscultation       Cardiovascular hypertension, Pt. on medications  Rhythm:Regular Rate:Normal     Neuro/Psych  Headaches, negative psych ROS   GI/Hepatic Neg liver ROS, GERD  Medicated,  Endo/Other  negative endocrine ROS  Renal/GU      Musculoskeletal negative musculoskeletal ROS (+)   Abdominal Normal abdominal exam  (+)   Peds  Hematology negative hematology ROS (+)   Anesthesia Other Findings   Reproductive/Obstetrics                             Anesthesia Physical Anesthesia Plan  ASA: II  Anesthesia Plan: MAC   Post-op Pain Management:    Induction: Intravenous  PONV Risk Score and Plan: 0 and Propofol infusion  Airway Management Planned: Natural Airway and Nasal Cannula  Additional Equipment: None  Intra-op Plan:   Post-operative Plan:   Informed Consent: I have reviewed the patients History and Physical, chart, labs and discussed the procedure including the risks, benefits and alternatives for the proposed anesthesia with the patient or authorized representative who has indicated his/her understanding and acceptance.       Plan Discussed with: CRNA  Anesthesia Plan Comments:         Anesthesia Quick Evaluation

## 2019-09-09 NOTE — Op Note (Signed)
[facility] Patient Name: Marissa Warner Procedure Date: 09/09/2019 MRN: [id number] Attending MD: Ronnette Juniper , MD Date of Birth: 12-18-1965 CSN: [id number] Age: 54 Admit Type: Outpatient Procedure:                Upper GI endoscopy Indications:              Unexplained iron deficiency anemia, Nausea Providers:                Ronnette Juniper, MD, Cleda Daub, RN, Janeece Agee,                            Technician Referring MD:             Mack Hook, MD Medicines:                Monitored Anesthesia Care Complications:            No immediate complications. Estimated blood loss:                            Minimal. Estimated Blood Loss:     Estimated blood loss was minimal. Procedure:                Pre-Anesthesia Assessment:                           - Prior to the procedure, a History and Physical                            was performed, and patient medications and                            allergies were reviewed. The patient's tolerance of                            previous anesthesia was also reviewed. The risks                            and benefits of the procedure and the sedation                            options and risks were discussed with the patient.                            All questions were answered, and informed consent                            was obtained. Prior Anticoagulants: The patient has                            taken no previous anticoagulant or antiplatelet                            agents. ASA Grade Assessment: II - A patient with  mild systemic disease. After reviewing the risks                            and benefits, the patient was deemed in                            satisfactory condition to undergo the procedure.                           After obtaining informed consent, the endoscope was                            passed under direct vision. Throughout the   procedure, the patient's blood pressure, pulse, and                            oxygen saturations were monitored continuously. The                            [id number]) Olympus gastroscope was                            introduced through the mouth, and advanced to the                            second part of duodenum. The upper GI endoscopy was                            accomplished without difficulty. The patient                            tolerated the procedure well. Scope In: Scope Out: Findings:      The upper third of the esophagus, middle third of the esophagus and       lower third of the esophagus were normal.      The Z-line was irregular and was found 34 cm from the incisors and       mucosa appearedd nodular. Biopsies were taken with a cold forceps for       histology.      Localized mildly erythematous mucosa without bleeding was found in the       gastric antrum. Biopsies were taken with a cold forceps for Helicobacter       pylori testing.      A 2 cm hiatal hernia was present.      The cardia and gastric fundus were normal on retroflexion.      The examined duodenum was normal. Biopsies for histology were taken with       a cold forceps for evaluation of celiac disease. Impression:               - Normal upper third of esophagus, middle third of                            esophagus and lower third of esophagus.                           -  Z-line irregular, 34 cm from the incisors.                            Biopsied.                           - Erythematous mucosa in the antrum. Biopsied.                           - 2 cm hiatal hernia.                           - Normal examined duodenum. Biopsied. Moderate Sedation:      Patient did not receive moderate sedation for this procedure, but       instead received monitored anesthesia care. Recommendation:           - Patient has a contact number available for                            emergencies. The signs  and symptoms of potential                            delayed complications were discussed with the                            patient. Return to normal activities tomorrow.                            Written discharge instructions were provided to the                            patient.                           - Resume regular diet.                           - Continue present medications.                           - Await pathology results. Procedure Code(s):        --- Professional ---                           [phone], Esophagogastroduodenoscopy, flexible,                            transoral; with biopsy, single or multiple Diagnosis Code(s):        --- Professional ---                           K22.8, Other specified diseases of esophagus                           K31.89, Other diseases of stomach and duodenum  K44.9, Diaphragmatic hernia without obstruction or                            gangrene                           D50.9, Iron deficiency anemia, unspecified                           R11.0, Nausea CPT copyright 2019 [employer]. All rights reserved. The codes documented in this report are preliminary and upon coder review may  be revised to meet current compliance requirements. Ronnette Juniper, MD 09/09/2019 11:44:01 AM This report has been signed electronically. Number of Addenda: 0

## 2019-09-09 NOTE — Brief Op Note (Signed)
09/09/2019  11:45 AM  PATIENT:  Marissa Warner  54 y.o. female  PRE-OPERATIVE DIAGNOSIS:  Microcytic anemia - D50.9  POST-OPERATIVE DIAGNOSIS:  nodular EG Junction /diverticulosis  PROCEDURE:  Procedure(s): COLONOSCOPY WITH PROPOFOL (N/A) ESOPHAGOGASTRODUODENOSCOPY (EGD) WITH PROPOFOL (N/A) BIOPSY  SURGEON:  Surgeon(s) and Role:    Ronnette Juniper, MD - Primary  PHYSICIAN ASSISTANT:   ASSISTANTS: Rise Paganini  ANESTHESIA:   MAC  EBL:  Minimal  BLOOD ADMINISTERED:none  DRAINS: none   LOCAL MEDICATIONS USED:  NONE  SPECIMEN:  Biopsy / Limited Resection  DISPOSITION OF SPECIMEN:  PATHOLOGY  COUNTS:  YES  TOURNIQUET:  * No tourniquets in log *  DICTATION: .Dragon Dictation  PLAN OF CARE: Discharge to home after PACU  PATIENT DISPOSITION:  PACU - hemodynamically stable.   Delay start of Pharmacological VTE agent (>24hrs) due to surgical blood loss or risk of bleeding: no

## 2019-09-09 NOTE — Interval H&P Note (Signed)
History and Physical Interval Note: 53/female with microcytic anemia for EGD and colonoscopy.  09/09/2019 10:54 AM  Marissa Warner  has presented today for EGD and colonoscopy with the diagnosis of Microcytic anemia - D50.9.  The various methods of treatment have been discussed with the patient and family. After consideration of risks, benefits and other options for treatment, the patient has consented to  Procedure(s): COLONOSCOPY WITH PROPOFOL (N/A) ESOPHAGOGASTRODUODENOSCOPY (EGD) WITH PROPOFOL (N/A) as a surgical intervention.  The patient's history has been reviewed, patient examined, no change in status, stable for surgery.  I have reviewed the patient's chart and labs.  Questions were answered to the patient's satisfaction.     Ronnette Juniper

## 2019-09-10 ENCOUNTER — Other Ambulatory Visit: Payer: Self-pay

## 2019-09-10 LAB — SURGICAL PATHOLOGY

## 2019-09-11 ENCOUNTER — Encounter: Payer: Self-pay | Admitting: *Deleted

## 2019-09-14 LAB — POCT I-STAT, CHEM 8
BUN: 10 mg/dL (ref 6–20)
Calcium, Ion: 1.27 mmol/L (ref 1.15–1.40)
Chloride: 105 mmol/L (ref 98–111)
Creatinine, Ser: 1.2 mg/dL — ABNORMAL HIGH (ref 0.44–1.00)
Glucose, Bld: 81 mg/dL (ref 70–99)
HCT: 37 % (ref 36.0–46.0)
Hemoglobin: 12.6 g/dL (ref 12.0–15.0)
Potassium: 3.6 mmol/L (ref 3.5–5.1)
Sodium: 141 mmol/L (ref 135–145)
TCO2: 27 mmol/L (ref 22–32)

## 2019-10-08 ENCOUNTER — Other Ambulatory Visit: Payer: Self-pay

## 2019-10-08 ENCOUNTER — Encounter (HOSPITAL_COMMUNITY): Payer: Self-pay | Admitting: Emergency Medicine

## 2019-10-08 ENCOUNTER — Emergency Department (HOSPITAL_COMMUNITY): Payer: Self-pay

## 2019-10-08 ENCOUNTER — Emergency Department (HOSPITAL_COMMUNITY)
Admission: EM | Admit: 2019-10-08 | Discharge: 2019-10-08 | Disposition: A | Discharge status: Home / Self Care | Payer: Self-pay | Attending: Emergency Medicine | Admitting: Emergency Medicine

## 2019-10-08 ENCOUNTER — Telehealth: Payer: Self-pay | Admitting: Internal Medicine

## 2019-10-08 DIAGNOSIS — M79601 Pain in right arm: Secondary | ICD-10-CM | POA: Insufficient documentation

## 2019-10-08 DIAGNOSIS — Z5321 Procedure and treatment not carried out due to patient leaving prior to being seen by health care provider: Secondary | ICD-10-CM | POA: Insufficient documentation

## 2019-10-08 LAB — CBC
HCT: 35.6 % — ABNORMAL LOW (ref 36.0–46.0)
Hemoglobin: 10.7 g/dL — ABNORMAL LOW (ref 12.0–15.0)
MCH: 23.2 pg — ABNORMAL LOW (ref 26.0–34.0)
MCHC: 30.1 g/dL (ref 30.0–36.0)
MCV: 77.2 fL — ABNORMAL LOW (ref 80.0–100.0)
Platelets: 208 10*3/uL (ref 150–400)
RBC: 4.61 MIL/uL (ref 3.87–5.11)
RDW: 15 % (ref 11.5–15.5)
WBC: 5.2 10*3/uL (ref 4.0–10.5)
nRBC: 0 % (ref 0.0–0.2)

## 2019-10-08 LAB — BASIC METABOLIC PANEL
Anion gap: 8 (ref 5–15)
BUN: 9 mg/dL (ref 6–20)
CO2: 23 mmol/L (ref 22–32)
Calcium: 9 mg/dL (ref 8.9–10.3)
Chloride: 107 mmol/L (ref 98–111)
Creatinine, Ser: 1.16 mg/dL — ABNORMAL HIGH (ref 0.44–1.00)
GFR calc Af Amer: >60 mL/min (ref >60)
GFR calc non Af Amer: 53 mL/min — ABNORMAL LOW (ref >60)
Glucose, Bld: 101 mg/dL — ABNORMAL HIGH (ref 70–99)
Potassium: 3.8 mmol/L (ref 3.5–5.1)
Sodium: 138 mmol/L (ref 135–145)

## 2019-10-08 LAB — URINALYSIS, ROUTINE W REFLEX MICROSCOPIC
Bilirubin Urine: NEGATIVE
Glucose, UA: NEGATIVE mg/dL
Ketones, ur: NEGATIVE mg/dL
Nitrite: NEGATIVE
Protein, ur: NEGATIVE mg/dL
Specific Gravity, Urine: 1.009 (ref 1.005–1.030)
pH: 5 (ref 5.0–8.0)

## 2019-10-08 LAB — TROPONIN I (HIGH SENSITIVITY): Troponin I (High Sensitivity): 3 ng/L (ref <18)

## 2019-10-08 NOTE — ED Notes (Signed)
Called pt X3 

## 2019-10-08 NOTE — ED Triage Notes (Signed)
Pt endorses sharp right arm pain since 2020. States that she has bilateral leg numbness. Denies CP or SOB. Endorses urinary frequency and would like her kidneys checked.

## 2019-10-08 NOTE — Telephone Encounter (Signed)
Patient called stating is at ER since this morning where got done X-rays' EKG, urine test and blood test but will like Dr. Amil Amen to review them and to inform patient with results.

## 2019-10-09 NOTE — Telephone Encounter (Signed)
Spoke with patient this morning. States she left the ER without actually being seen. States she had been there since 9:30 am and it was almost 4 om and she had to leave or she would not have had a ride from the ER. Patient asking if Dr. Amil Amen can review the labs they did in the ER and let her know the results. Patient was educated on calling the office before going to the ER and if she does go tot the ER not to leave before her visit is finished. Patient verbalized understanding.  To Dr. Amil Amen for further directions.

## 2019-11-27 ENCOUNTER — Ambulatory Visit: Payer: Self-pay | Admitting: Internal Medicine

## 2019-12-16 ENCOUNTER — Other Ambulatory Visit: Payer: Self-pay

## 2019-12-16 ENCOUNTER — Ambulatory Visit: Payer: Self-pay | Admitting: Internal Medicine

## 2019-12-16 ENCOUNTER — Encounter: Payer: Self-pay | Admitting: Internal Medicine

## 2019-12-16 VITALS — BP 102/70 | HR 84 | Resp 12 | Ht 64.5 in | Wt 193.0 lb

## 2019-12-16 DIAGNOSIS — M75101 Unspecified rotator cuff tear or rupture of right shoulder, not specified as traumatic: Secondary | ICD-10-CM

## 2019-12-16 DIAGNOSIS — M25561 Pain in right knee: Secondary | ICD-10-CM

## 2019-12-16 DIAGNOSIS — G8929 Other chronic pain: Secondary | ICD-10-CM

## 2019-12-16 DIAGNOSIS — I10 Essential (primary) hypertension: Secondary | ICD-10-CM

## 2019-12-16 MED ORDER — LISINOPRIL 10 MG PO TABS: 10.0000 mg | ORAL_TABLET | Freq: Every day | ORAL | 11 refills | Status: DC

## 2019-12-16 NOTE — Progress Notes (Signed)
Subjective:    Patient ID: Marissa Warner, female   DOB: [birth year], 54 y.o.   MRN: [id number]   HPI   Poor historian.  1.  Right Knee pain:  Generally doing okay.  Later, states there are days she uses a walker.  She did get to PT in [place]--apparently all virtual.  Was given exercises for both her shoulder and her knee.   She still has not gone for the knee xray.  States she could not afford even the reduced charge. She has not completed paperwork still for there orange card.    2.  Right shoulder pain:  Felt to have rotator cuff tendinitis.  Sounds like she only is performing ROM exercises as never met in person.  She states she was supposed to call back to PT, but has not.    Went to ED on 10/08/19 for her right shoulder and arm pain--does not appear she was fully evaluated before she left--states her ride was going to leave.  For some reason, a chest Xray was ordered, but not neck or shoulder films.   Also, sounds like she has received financial assistance through [facility] following this visit.  She will bring in papers.  Current Meds  Medication Sig  . acetaminophen (TYLENOL) 500 MG tablet Take 500 mg by mouth every 6 (six) hours as needed for mild pain.  . cetirizine (ZYRTEC) 10 MG tablet Take 10 mg by mouth daily as needed for allergies.  . cyclobenzaprine (FLEXERIL) 10 MG tablet 1/2 to 1 tab by mouth every 8 hours as needed for shoulder and neck pain (Patient taking differently: Take 10 mg by mouth every 8 (eight) hours as needed (shoulder and neck pain). )  . diclofenac Sodium (VOLTAREN) 1 % GEL Apply 4 g topically 4 (four) times daily.  . Ferrous Gluconate 324 (37.5 Fe) MG TABS Take 324 mg by mouth 2 (two) times daily.   Marland Kitchen guaiFENesin (MUCINEX) 600 MG 12 hr tablet Take 600 mg by mouth 2 (two) times daily.   Marland Kitchen lisinopril (ZESTRIL) 10 MG tablet Take 1 tablet (10 mg total) by mouth daily.  Marland Kitchen omeprazole (PRILOSEC) 40 MG capsule Take 1 capsule (40 mg total) by mouth daily.  .  Polyvinyl Alcohol-Povidone (REFRESH OP) Place 1 drop into both eyes daily as needed (dry eyes).  . psyllium (METAMUCIL SMOOTH TEXTURE) 58.6 % powder Take 1 packet by mouth daily.  . SUMAtriptan (IMITREX) 50 MG tablet 1 tab by mouth for migraine headache, may repeat once in 2 hours if not relieved (Patient taking differently: Take 50 mg by mouth every 2 (two) hours as needed for migraine. )  . Vitamin D, Ergocalciferol, (DRISDOL) 1.25 MG (50000 UNIT) CAPS capsule Take 50,000 Units by mouth every Monday.    No Known Allergies   Review of Systems    Objective:   BP 102/70 (BP Location: Left Arm, Patient Position: Sitting, Cuff Size: Large)   Pulse 84   Resp 12   Ht 5' 4.5" (1.638 m)   Wt 193 lb (87.5 kg)   BMI 32.62 kg/m   Physical Exam  NAD Lungs:  CTA CV:  RRR without murmur or rub.  Radial and DP pulses normal and equal.  Right shoulder/neck:  NT over right para cervical musculature.  Tender over Sternocleidomastoid musculature on right.  Subacromial bursa with tenderness as does AC joint and minimal CC joint tenderness.  Unable to fully abduct right shoulder due to pain.  More discomfort with downward  pressure on internally rotated abducted right shoulder/arm.  5/5 strength of UE bilaterally.  Does state gets numbness in fingertips at times. Right knee without effusion.  Tender over lateral collateral ligament and right posterior knee.  Mild decreased flexion and discomfort on palpation of patella with mild crepitation.     Assessment & Plan  1.  Right shoulder pain:  Seems most consistent still with rotator cuff tendinitis/syndrome and less likely a right cervical radiculopathy:  As appears to have financial assistance with [facility], will refer to [place].  She will bring in her paperwork regarding financial assistance and Cherice will also call [facility] to confirm as patient not necessarily clear on this or good with follow up  2.  Right knee pain:  Not clear when this bothers her  the most.  Have tried to get her an xray without success. Not clear how much is more a patella-femoral syndrome or arthritis vs knee joint abnormality itself.   Referral also to [place].  3.  COvID vaccination today  4.  Hypertension:  Refill of meds.

## 2019-12-25 ENCOUNTER — Other Ambulatory Visit: Payer: Self-pay

## 2019-12-25 ENCOUNTER — Telehealth: Payer: Self-pay

## 2019-12-25 ENCOUNTER — Ambulatory Visit (INDEPENDENT_AMBULATORY_CARE_PROVIDER_SITE_OTHER): Payer: Self-pay | Admitting: Orthopaedic Surgery

## 2019-12-25 ENCOUNTER — Ambulatory Visit (INDEPENDENT_AMBULATORY_CARE_PROVIDER_SITE_OTHER): Payer: Self-pay

## 2019-12-25 ENCOUNTER — Encounter: Payer: Self-pay | Admitting: Orthopaedic Surgery

## 2019-12-25 VITALS — Ht 65.0 in | Wt 195.0 lb

## 2019-12-25 DIAGNOSIS — M542 Cervicalgia: Secondary | ICD-10-CM

## 2019-12-25 DIAGNOSIS — M25561 Pain in right knee: Secondary | ICD-10-CM

## 2019-12-25 DIAGNOSIS — G8929 Other chronic pain: Secondary | ICD-10-CM

## 2019-12-25 DIAGNOSIS — M75101 Unspecified rotator cuff tear or rupture of right shoulder, not specified as traumatic: Secondary | ICD-10-CM

## 2019-12-25 MED ORDER — METHYLPREDNISOLONE ACETATE 40 MG/ML IJ SUSP
80.0000 mg | INTRAMUSCULAR | Status: AC | PRN
  Administered 2019-12-25: 80 mg via INTRA_ARTICULAR

## 2019-12-25 MED ORDER — LIDOCAINE HCL 2 % IJ SOLN
2.0000 mL | INTRAMUSCULAR | Status: AC | PRN
  Administered 2019-12-25: 2 mL

## 2019-12-25 MED ORDER — BUPIVACAINE HCL 0.5 % IJ SOLN
2.0000 mL | INTRAMUSCULAR | Status: AC | PRN
  Administered 2019-12-25: 2 mL via INTRA_ARTICULAR

## 2019-12-25 NOTE — Telephone Encounter (Signed)
Patient needs to be worked in has to return in 2 weeks with dr whitfield .  Says she can  Do any day but August 16th and she prefer morning appt

## 2019-12-25 NOTE — Telephone Encounter (Signed)
Patient has been called and scheduled

## 2019-12-25 NOTE — Progress Notes (Signed)
Office Visit Note   Patient: Marissa Warner           Date of Birth: 05-23-1966           MRN: [id number] Visit Date: 12/25/2019              Requested by: Mack Hook, MD [place],  [place] [id number] PCP: Mack Hook, MD   Assessment & Plan: Visit Diagnoses:  1. Neck pain   2. Chronic pain of right knee   3. Rotator cuff syndrome of right shoulder     Plan: I think Mrs. Shimizu has issues with 3 different locations.  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  This could certainly be the cause of some of her right upper extremity pain and possibly radiculopathy.  She is also has impingement symptoms of her right shoulder.  There are some degenerative changes in the medial compartment of her right knee.  I have discussed use of Tylenol and Advil.  I am going to inject the subacromial space of her right shoulder and have her return in 2 weeks.  We will consider further diagnostic testing of her shoulder or her neck pending her evaluation at that time.  Follow-Up Instructions: Return in about 2 weeks (around 01/08/2020).   Orders:  Orders Placed This Encounter  Procedures  . XR Cervical Spine 2 or 3 views  . XR KNEE 3 VIEW RIGHT   No orders of the defined types were placed in this encounter.     Procedures: Large Joint Inj: R subacromial bursa on 12/25/2019 10:38 AM Indications: pain and diagnostic evaluation Details: 25 G 1.5 in needle, anterolateral approach  Arthrogram: No  Medications: 2 mL lidocaine 2 %; 2 mL bupivacaine 0.5 %; 80 mg methylPREDNISolone acetate 40 MG/ML Consent was given by the patient. Immediately prior to procedure a time out was called to verify the correct patient, procedure, equipment, support staff and site/side marked as required. Patient was prepped and draped in the usual sterile fashion.       Clinical Data: No additional findings.   Subjective: Chief  Complaint  Patient presents with  . Right Shoulder - Pain  . Right Knee - Pain  Patient presents today for right shoulder pain. She said that it has been hurting for a year. No known injury. She said that her pain starts on the right side of her neck and runs all the way down her arm. Numbness and tingling down her arm. She said that her arm is weak and occasionally drops things. She is right hand dominant. She takes Tylenol for the pain. She states that her range of motion is good. She also presents today for her right knee. She said that it started hurting a year ago as well. No known injury. She said that she just woke up one morning and unable to bear weight. Her pain is located in her right knee and both calf muscles. She states that her knee grinds and will give way. She has been going to physical therapy and states that it has helped. No previous knee surgery.   Recently had a chest x-ray.  I reviewed this specifically to evaluate her shoulders.  I did not see any acute changes.  Views were not ideal.  No obvious arthritis at the glenohumeral joint  HPI  Review of Systems   Objective: Vital Signs: Ht 5\' 5"  (1.651 m)   Wt 195 lb (  88.5 kg)   BMI 32.45 kg/m   Physical Exam Constitutional:      Appearance: She is well-developed.  Eyes:     Pupils: Pupils are equal, round, and reactive to light.  Pulmonary:     Effort: Pulmonary effort is normal.  Skin:    General: Skin is warm and dry.  Neurological:     Mental Status: She is alert and oriented to person, place, and time.  Psychiatric:        Behavior: Behavior normal.     Ortho Exam awake alert and oriented x3.  Poor historian with difficulty voicing her symptoms.  No evidence of thoracic outlet.  Neurovascularly intact to both upper extremities.  Has positive impingement and empty can testing right shoulder.  Biceps intact.  Negative Speed sign.  Full overhead motion but with some discomfort.  No grinding or crepitation.  Some  tenderness in the anterior subacromial region.  Could not reproduce the pain she has been experiencing on an episodic basis in her right upper extremity with motion of her neck but she might have radicular symptoms. Specialty Comments:  No specialty comments available.  Imaging: XR Cervical Spine 2 or 3 views  Result Date: 12/25/2019 Films of the cervical spine were obtained in 2 projections.  There is significant degenerative disc disease at C5-6 and C6-7 with anterior and posterior osteophytes and narrowing of the disc space.  There are also facet joint changes.  There is straightening of the normal cervical lordosis  XR KNEE 3 VIEW RIGHT  Result Date: 12/25/2019 Films of the right knee were obtained in several projections standing.  There is minimal degenerative change in the medial compartment.  The joint space is still maintained with very small osteophytes and about 1 degree of varus.  No ectopic calcification or acute change.    PMFS History: Patient Active Problem List   Diagnosis Date Noted  . Neck pain 12/25/2019  . Rotator cuff syndrome of right shoulder 12/16/2019  . Constipation 03/05/2019  . Gastroesophageal reflux disease without esophagitis 03/05/2019  . Hyperglycemia 12/23/2016  . Anemia 03/02/2016  . Chronic pain of right knee 03/02/2016  . Essential hypertension 01/20/2016  . Allergy   . Hypertension   . Renal disorder    Past Medical History:  Diagnosis Date  . Allergy    environmental and seasonal  . Blood transfusion without reported diagnosis before 2004   due to heavy periods  . Hypertension   . Migraines 2007  . Renal disorder    Pt. states "Stage III Kidney disease"  Never had kidney biopsy.  She is unable to tell me what the cause of the kidney disease.  Reportedly diagnosed with [facility] in [place].    History reviewed. No pertinent family history.  Past Surgical History:  Procedure Laterality Date  . ABDOMINAL HYSTERECTOMY  2004    With BSO, she thinks  . BIOPSY  09/09/2019   Procedure: BIOPSY;  Surgeon: Ronnette Juniper, MD;  Location: [facility];  Service: Gastroenterology;;  . CHOLECYSTECTOMY  uncertain date   Laparoscopic  . COLONOSCOPY WITH PROPOFOL N/A 09/09/2019   Procedure: COLONOSCOPY WITH PROPOFOL;  Surgeon: Ronnette Juniper, MD;  Location: [facility];  Service: Gastroenterology;  Laterality: N/A;  . ESOPHAGOGASTRODUODENOSCOPY (EGD) WITH PROPOFOL N/A 09/09/2019   Procedure: ESOPHAGOGASTRODUODENOSCOPY (EGD) WITH PROPOFOL;  Surgeon: Ronnette Juniper, MD;  Location: [facility];  Service: Gastroenterology;  Laterality: N/A;  . LAPAROSCOPIC INCISIONAL / UMBILICAL / [place]  2000   Social  History   Occupational History  . Occupation: unemployed    Comment: Previously in housekeeping  Tobacco Use  . Smoking status: Never Smoker  . Smokeless tobacco: Current User    Types: Chew  . Tobacco comment: down to once daily or every other day:  8/19  Substance and Sexual Activity  . Alcohol use: No  . Drug use: No  . Sexual activity: Never    Partners: Male

## 2020-01-07 ENCOUNTER — Ambulatory Visit (INDEPENDENT_AMBULATORY_CARE_PROVIDER_SITE_OTHER): Payer: Self-pay | Admitting: Orthopedic Surgery

## 2020-01-07 ENCOUNTER — Other Ambulatory Visit: Payer: Self-pay

## 2020-01-07 ENCOUNTER — Encounter: Payer: Self-pay | Admitting: Orthopedic Surgery

## 2020-01-07 VITALS — Ht 65.0 in | Wt 195.0 lb

## 2020-01-07 DIAGNOSIS — M75101 Unspecified rotator cuff tear or rupture of right shoulder, not specified as traumatic: Secondary | ICD-10-CM

## 2020-01-07 DIAGNOSIS — R202 Paresthesia of skin: Secondary | ICD-10-CM

## 2020-01-07 DIAGNOSIS — M542 Cervicalgia: Secondary | ICD-10-CM

## 2020-01-07 DIAGNOSIS — R200 Anesthesia of skin: Secondary | ICD-10-CM

## 2020-01-07 NOTE — Progress Notes (Signed)
Office Visit Note   Patient: Marissa Warner           Date of Birth: 05-01-1966           MRN: [id number] Visit Date: 01/07/2020              Requested by: Mack Hook, MD [place],  [place] [id number] PCP: Mack Hook, MD   Assessment & Plan: Visit Diagnoses:  1. Neck pain   2. Numbness and tingling of right arm   3. Rotator cuff syndrome of right shoulder     Plan:  #1: At this time we are going to schedule her for an MRI of the cervical spine because of her symptoms that are all of the entire arm down into the hand.  Follow-Up Instructions: No follow-ups on file.   Orders:  No orders of the defined types were placed in this encounter.  No orders of the defined types were placed in this encounter.     Procedures: No procedures performed   Clinical Data: No additional findings.   Subjective: Chief Complaint  Patient presents with  . Right Shoulder - Follow-up, Pain  . Neck - Follow-up, Pain    HPI Patient presents today for follow up on her right shoulder and neck. She received a cortisone injection in her right shoulder two weeks ago. She states that her shoulder still aches, but the injection did help. She takes Tylenol for pain. She states that she has "slight" numbness in her arms. She is right hand dominant.   Review of Systems  Constitutional: Negative for fatigue.  HENT: Negative for ear pain.   Eyes: Negative for pain.  Respiratory: Negative for shortness of breath.   Cardiovascular: Negative for leg swelling.  Gastrointestinal: Negative for constipation and diarrhea.  Endocrine: Negative for cold intolerance and heat intolerance.  Genitourinary: Negative for difficulty urinating.  Musculoskeletal: Negative for joint swelling.  Skin: Negative for rash.  Allergic/Immunologic: Negative for food allergies.  Neurological: Positive for weakness.  Hematological: Does not bruise/bleed easily.  Psychiatric/Behavioral: Positive  for sleep disturbance.     Objective: Vital Signs: Ht 5\' 5"  (1.651 m)   Wt 195 lb (88.5 kg)   BMI 32.45 kg/m   Physical Exam Constitutional:      Appearance: Normal appearance. She is well-developed.  Eyes:     Pupils: Pupils are equal, round, and reactive to light.  Pulmonary:     Effort: Pulmonary effort is normal.  Skin:    General: Skin is warm and dry.  Neurological:     Mental Status: She is alert and oriented to person, place, and time.  Psychiatric:        Behavior: Behavior normal.     Ortho Exam  She does have decreased range of motion of the cervical spine.  She does have some pain radiating down in the right arm.  She has fairly good strength in the upper extremities.  Her deep tendon reflexes revealed decrease in the biceps on the right in comparison to the left.  She does have good grip strength.   Specialty Comments:  No specialty comments available.  Imaging: No results found.     PMFS History: Current Outpatient Medications  Medication Sig Dispense Refill  . acetaminophen (TYLENOL) 500 MG tablet Take 500 mg by mouth every 6 (six) hours as needed for mild pain.    . cetirizine (ZYRTEC) 10 MG tablet Take 10 mg by mouth daily as needed for allergies.    Marland Kitchen  cyclobenzaprine (FLEXERIL) 10 MG tablet 1/2 to 1 tab by mouth every 8 hours as needed for shoulder and neck pain (Patient taking differently: Take 10 mg by mouth every 8 (eight) hours as needed (shoulder and neck pain). ) 30 tablet 0  . diclofenac Sodium (VOLTAREN) 1 % GEL Apply 4 g topically 4 (four) times daily. 100 g 11  . Ferrous Gluconate 324 (37.5 Fe) MG TABS Take 324 mg by mouth 2 (two) times daily.     Marland Kitchen guaiFENesin (MUCINEX) 600 MG 12 hr tablet Take 600 mg by mouth 2 (two) times daily.     Marland Kitchen lisinopril (ZESTRIL) 10 MG tablet Take 1 tablet (10 mg total) by mouth daily. 30 tablet 11  . omeprazole (PRILOSEC) 40 MG capsule Take 1 capsule (40 mg total) by mouth daily. 30 capsule 6  . Polyvinyl  Alcohol-Povidone (REFRESH OP) Place 1 drop into both eyes daily as needed (dry eyes).    . psyllium (METAMUCIL SMOOTH TEXTURE) 58.6 % powder Take 1 packet by mouth daily. 283 g 12  . SUMAtriptan (IMITREX) 50 MG tablet 1 tab by mouth for migraine headache, may repeat once in 2 hours if not relieved (Patient taking differently: Take 50 mg by mouth every 2 (two) hours as needed for migraine. ) 9 tablet 11  . Vitamin D, Ergocalciferol, (DRISDOL) 1.25 MG (50000 UNIT) CAPS capsule Take 50,000 Units by mouth every Monday.      No current facility-administered medications for this visit.    Patient Active Problem List   Diagnosis Date Noted  . Right arm numbness 01/07/2020  . Neck pain 12/25/2019  . Rotator cuff syndrome of right shoulder 12/16/2019  . Constipation 03/05/2019  . Gastroesophageal reflux disease without esophagitis 03/05/2019  . Hyperglycemia 12/23/2016  . Anemia 03/02/2016  . Chronic pain of right knee 03/02/2016  . Essential hypertension 01/20/2016  . Allergy   . Hypertension   . Renal disorder    Past Medical History:  Diagnosis Date  . Allergy    environmental and seasonal  . Blood transfusion without reported diagnosis before 2004   due to heavy periods  . Hypertension   . Migraines 2007  . Renal disorder    Pt. states "Stage III Kidney disease"  Never had kidney biopsy.  She is unable to tell me what the cause of the kidney disease.  Reportedly diagnosed with [facility] in [place].    History reviewed. No pertinent family history.  Past Surgical History:  Procedure Laterality Date  . ABDOMINAL HYSTERECTOMY  2004   With BSO, she thinks  . BIOPSY  09/09/2019   Procedure: BIOPSY;  Surgeon: Ronnette Juniper, MD;  Location: [facility];  Service: Gastroenterology;;  . CHOLECYSTECTOMY  uncertain date   Laparoscopic  . COLONOSCOPY WITH PROPOFOL N/A 09/09/2019   Procedure: COLONOSCOPY WITH PROPOFOL;  Surgeon: Ronnette Juniper, MD;  Location: [facility];  Service:  Gastroenterology;  Laterality: N/A;  . ESOPHAGOGASTRODUODENOSCOPY (EGD) WITH PROPOFOL N/A 09/09/2019   Procedure: ESOPHAGOGASTRODUODENOSCOPY (EGD) WITH PROPOFOL;  Surgeon: Ronnette Juniper, MD;  Location: [facility];  Service: Gastroenterology;  Laterality: N/A;  . LAPAROSCOPIC INCISIONAL / UMBILICAL / [place] [facility]  2000   Social History   Occupational History  . Occupation: unemployed    Comment: Previously in housekeeping  Tobacco Use  . Smoking status: Never Smoker  . Smokeless tobacco: Current User    Types: Chew  . Tobacco comment: down to once daily or every other day:  8/19  Substance and Sexual  Activity  . Alcohol use: No  . Drug use: No  . Sexual activity: Never    Partners: Male

## 2020-01-13 ENCOUNTER — Telehealth: Payer: Self-pay | Admitting: *Deleted

## 2020-01-13 ENCOUNTER — Other Ambulatory Visit: Payer: Self-pay

## 2020-01-13 DIAGNOSIS — R200 Anesthesia of skin: Secondary | ICD-10-CM

## 2020-01-13 DIAGNOSIS — R202 Paresthesia of skin: Secondary | ICD-10-CM

## 2020-01-13 DIAGNOSIS — M542 Cervicalgia: Secondary | ICD-10-CM

## 2020-01-13 NOTE — Telephone Encounter (Signed)
Called patient. I was not made aware that she needed an MRI, but I read the dictation and ordered. Patient was given the number to call about financial assistance because she states that she does not have any insurance.

## 2020-01-13 NOTE — Telephone Encounter (Signed)
Pt called stating she was calling to check status of MRI Cervical to see when she will be schedule. Please check to see if pt is needing an MRI.   Thanks

## 2020-01-21 ENCOUNTER — Telehealth: Payer: Self-pay | Admitting: Orthopaedic Surgery

## 2020-01-21 NOTE — Telephone Encounter (Signed)
Spoke with patient. I told her that it was documented she wanted to cancel the MRI on 01/13/2020. She agreed. Then she ask about her knee x-rays. She wanted to know what kind of arthritis she had. I explained that it was osteoarthritis in her knee. She states that her knee is very painful. She is working on [employer] planner and will call us when she has that sorted out.

## 2020-01-21 NOTE — Telephone Encounter (Signed)
Pt called wanting to check on her MRI that was supposed to set up for her, she wanted to make sure they would be able to bill her. Pt would also like to ask some questions about an X-Ray she states was taken of her leg.   [phone]

## 2020-03-17 ENCOUNTER — Ambulatory Visit: Payer: Self-pay | Admitting: Internal Medicine

## 2020-04-27 ENCOUNTER — Ambulatory Visit: Payer: Self-pay | Admitting: Internal Medicine

## 2020-06-22 ENCOUNTER — Other Ambulatory Visit: Payer: Self-pay

## 2020-06-22 ENCOUNTER — Emergency Department (HOSPITAL_COMMUNITY)
Admission: EM | Admit: 2020-06-22 | Discharge: 2020-06-23 | Disposition: A | Discharge status: Home / Self Care | Payer: Medicaid Other | Attending: Emergency Medicine | Admitting: Emergency Medicine

## 2020-06-22 ENCOUNTER — Encounter (HOSPITAL_COMMUNITY): Payer: Self-pay | Admitting: Emergency Medicine

## 2020-06-22 DIAGNOSIS — R040 Epistaxis: Secondary | ICD-10-CM | POA: Diagnosis not present

## 2020-06-22 DIAGNOSIS — M5459 Other low back pain: Secondary | ICD-10-CM | POA: Diagnosis not present

## 2020-06-22 DIAGNOSIS — I10 Essential (primary) hypertension: Secondary | ICD-10-CM | POA: Diagnosis not present

## 2020-06-22 DIAGNOSIS — R350 Frequency of micturition: Secondary | ICD-10-CM | POA: Diagnosis present

## 2020-06-22 DIAGNOSIS — F17220 Nicotine dependence, chewing tobacco, uncomplicated: Secondary | ICD-10-CM | POA: Insufficient documentation

## 2020-06-22 DIAGNOSIS — Z79899 Other long term (current) drug therapy: Secondary | ICD-10-CM | POA: Insufficient documentation

## 2020-06-22 DIAGNOSIS — G8929 Other chronic pain: Secondary | ICD-10-CM | POA: Diagnosis not present

## 2020-06-22 LAB — URINALYSIS, ROUTINE W REFLEX MICROSCOPIC
Bilirubin Urine: NEGATIVE
Glucose, UA: NEGATIVE mg/dL
Hgb urine dipstick: NEGATIVE
Ketones, ur: NEGATIVE mg/dL
Nitrite: NEGATIVE
Protein, ur: 30 mg/dL — AB
Specific Gravity, Urine: 1.025 (ref 1.005–1.030)
pH: 5 (ref 5.0–8.0)

## 2020-06-22 LAB — BASIC METABOLIC PANEL
Anion gap: 7 (ref 5–15)
BUN: 13 mg/dL (ref 6–20)
CO2: 23 mmol/L (ref 22–32)
Calcium: 8.8 mg/dL — ABNORMAL LOW (ref 8.9–10.3)
Chloride: 108 mmol/L (ref 98–111)
Creatinine, Ser: 1.27 mg/dL — ABNORMAL HIGH (ref 0.44–1.00)
GFR, Estimated: 50 mL/min — ABNORMAL LOW (ref >60)
Glucose, Bld: 109 mg/dL — ABNORMAL HIGH (ref 70–99)
Potassium: 3.8 mmol/L (ref 3.5–5.1)
Sodium: 138 mmol/L (ref 135–145)

## 2020-06-22 LAB — CBC
HCT: 35 % — ABNORMAL LOW (ref 36.0–46.0)
Hemoglobin: 10.4 g/dL — ABNORMAL LOW (ref 12.0–15.0)
MCH: 23 pg — ABNORMAL LOW (ref 26.0–34.0)
MCHC: 29.7 g/dL — ABNORMAL LOW (ref 30.0–36.0)
MCV: 77.3 fL — ABNORMAL LOW (ref 80.0–100.0)
Platelets: 210 10*3/uL (ref 150–400)
RBC: 4.53 MIL/uL (ref 3.87–5.11)
RDW: 14.6 % (ref 11.5–15.5)
WBC: 5.6 10*3/uL (ref 4.0–10.5)
nRBC: 0 % (ref 0.0–0.2)

## 2020-06-22 NOTE — ED Triage Notes (Signed)
Pt presents with multiple complaints. C/o nose bleed that started at 3pm today, but has subsided. Also c/o back pain and urinary frequency.

## 2020-06-23 MED ORDER — CEPHALEXIN 250 MG PO CAPS
500.0000 mg | ORAL_CAPSULE | Freq: Once | ORAL | Status: AC
  Administered 2020-06-23: 500 mg via ORAL
  Filled 2020-06-23: qty 2

## 2020-06-23 MED ORDER — CEPHALEXIN 500 MG PO CAPS
500.0000 mg | ORAL_CAPSULE | Freq: Two times a day (BID) | ORAL | 0 refills | Status: DC
Start: 2020-06-23 — End: 2020-10-04

## 2020-06-23 NOTE — Discharge Instructions (Addendum)
Labs today looked good, kidney function was at your baseline. Take the prescribed medication as directed.  Make sure to finish all the medication. Can use nasal spray if needed for your nose to keep nasal passages moist and prevent re-bleeding. Follow-up with your primary care doctor. Return to the ED for new or worsening symptoms.

## 2020-06-23 NOTE — ED Provider Notes (Signed)
[facility] EMERGENCY DEPARTMENT Provider Note   CSN: [id number] Arrival date & time: 06/22/20  1641     History Chief Complaint  Patient presents with  . Back Pain    Marissa Warner is a 55 y.o. female.  The history is provided by the patient and medical records.  Back Pain   55 year old female with history of seasonal allergies, hypertension, migraines, chronic kidney disease, presenting to the ED with multiple complaints.  1.  Nosebleed--states this started earlier today from her right nostril.  Thought it was due to some "sinus issues" as she has had some congestion.  She has not had any fever, chills, cough, shortness of breath, or other upper respiratory symptoms.  States sometimes this happens during the winter.  She has no history of bleeding disorder and is not on anticoagulation.  No bleeding while here in the emergency department.  2.  Urinary frequency-- states this has been ongoing for a few days now.  She reports urge to urinate constantly, sometimes only small amounts of urine.  Some dysuria yesterday, none since then.  No hematuria.  No flank pain, fever, chills.  No hx of kidney stones.  3.  Back pain-- chronic issue for her, middle of back and radiates up and down.  States she has seen [facility] for this before and told it was related to arthritis.  granddaugther took her to special clinic who wanted to enroll her in study but it was too expensive.  She denies any changes in her pain, just ongoing.  She has not had any new numbness or weakness of the legs.  No bowel or bladder incontinence.  She has no history of cancer or IV drug use.  Past Medical History:  Diagnosis Date  . Allergy    environmental and seasonal  . Blood transfusion without reported diagnosis before 2004   due to heavy periods  . Hypertension   . Migraines 2007  . Renal disorder    Pt. states "Stage III Kidney disease"  Never had kidney biopsy.  She is unable to tell me what  the cause of the kidney disease.  Reportedly diagnosed with [facility] in [place].    Patient Active Problem List   Diagnosis Date Noted  . Right arm numbness 01/07/2020  . Neck pain 12/25/2019  . Rotator cuff syndrome of right shoulder 12/16/2019  . Constipation 03/05/2019  . Gastroesophageal reflux disease without esophagitis 03/05/2019  . Hyperglycemia 12/23/2016  . Anemia 03/02/2016  . Chronic pain of right knee 03/02/2016  . Essential hypertension 01/20/2016  . Allergy   . Hypertension   . Renal disorder     Past Surgical History:  Procedure Laterality Date  . ABDOMINAL HYSTERECTOMY  2004   With BSO, she thinks  . BIOPSY  09/09/2019   Procedure: BIOPSY;  Surgeon: Ronnette Juniper, MD;  Location: [facility];  Service: Gastroenterology;;  . CHOLECYSTECTOMY  uncertain date   Laparoscopic  . COLONOSCOPY WITH PROPOFOL N/A 09/09/2019   Procedure: COLONOSCOPY WITH PROPOFOL;  Surgeon: Ronnette Juniper, MD;  Location: [facility];  Service: Gastroenterology;  Laterality: N/A;  . ESOPHAGOGASTRODUODENOSCOPY (EGD) WITH PROPOFOL N/A 09/09/2019   Procedure: ESOPHAGOGASTRODUODENOSCOPY (EGD) WITH PROPOFOL;  Surgeon: Ronnette Juniper, MD;  Location: [facility];  Service: Gastroenterology;  Laterality: N/A;  . LAPAROSCOPIC INCISIONAL / UMBILICAL / [place]     OB History   No obstetric history on file.     No family history on file.  Social  History   Tobacco Use  . Smoking status: Never Smoker  . Smokeless tobacco: Current User    Types: Chew  . Tobacco comment: down to once daily or every other day:  8/19  Substance Use Topics  . Alcohol use: No  . Drug use: No    Home Medications Prior to Admission medications   Medication Sig Start Date End Date Taking? Authorizing Provider  acetaminophen (TYLENOL) 500 MG tablet Take 500 mg by mouth every 6 (six) hours as needed for mild pain.    [provider]  cetirizine (ZYRTEC) 10 MG tablet Take 10 mg by  mouth daily as needed for allergies.    [provider]  cyclobenzaprine (FLEXERIL) 10 MG tablet 1/2 to 1 tab by mouth every 8 hours as needed for shoulder and neck pain Patient taking differently: Take 10 mg by mouth every 8 (eight) hours as needed (shoulder and neck pain).  01/19/18   Mack Hook, MD  diclofenac Sodium (VOLTAREN) 1 % GEL Apply 4 g topically 4 (four) times daily. 06/03/19   Mack Hook, MD  Ferrous Gluconate 324 (37.5 Fe) MG TABS Take 324 mg by mouth 2 (two) times daily.     [provider]  guaiFENesin (MUCINEX) 600 MG 12 hr tablet Take 600 mg by mouth 2 (two) times daily.     [provider]  lisinopril (ZESTRIL) 10 MG tablet Take 1 tablet (10 mg total) by mouth daily. 12/16/19   Mack Hook, MD  omeprazole (PRILOSEC) 40 MG capsule Take 1 capsule (40 mg total) by mouth daily. 03/05/19   Mack Hook, MD  Polyvinyl Alcohol-Povidone (REFRESH OP) Place 1 drop into both eyes daily as needed (dry eyes).    [provider]  psyllium (METAMUCIL SMOOTH TEXTURE) 58.6 % powder Take 1 packet by mouth daily. 03/05/19   Mack Hook, MD  SUMAtriptan (IMITREX) 50 MG tablet 1 tab by mouth for migraine headache, may repeat once in 2 hours if not relieved Patient taking differently: Take 50 mg by mouth every 2 (two) hours as needed for migraine.  01/19/18   Mack Hook, MD  Vitamin D, Ergocalciferol, (DRISDOL) 1.25 MG (50000 UNIT) CAPS capsule Take 50,000 Units by mouth every Monday.     [provider]    Allergies    Patient has no known allergies.  Review of Systems   Review of Systems  Musculoskeletal: Positive for back pain.  All other systems reviewed and are negative.   Physical Exam Updated Vital Signs BP 135/76 (BP Location: Right Arm)   Pulse 81   Temp 98.3 F (36.8 C) (Oral)   Resp 20   Ht 5\' 5"  (1.651 m)   Wt 88.5 kg   SpO2 99%   BMI 32.45 kg/m   Physical Exam Vitals and nursing  note reviewed.  Constitutional:      Appearance: She is well-developed and well-nourished.  HENT:     Head: Normocephalic and atraumatic.     Nose:     Comments: Dried blood in right nostril along lateral wall, no active bleeding, no septal hematoma or deformity, nasal passages appear dry    Mouth/Throat:     Mouth: Oropharynx is clear and moist.  Eyes:     Extraocular Movements: EOM normal.     Conjunctiva/sclera: Conjunctivae normal.     Pupils: Pupils are equal, round, and reactive to light.  Cardiovascular:     Rate and Rhythm: Normal rate and regular rhythm.     Heart  sounds: Normal heart sounds.  Pulmonary:     Effort: Pulmonary effort is normal. No respiratory distress.     Breath sounds: Normal breath sounds. No rhonchi.  Abdominal:     General: Bowel sounds are normal.     Palpations: Abdomen is soft.     Tenderness: There is no abdominal tenderness. There is no rebound.     Comments: Soft, nontender, no CVA tenderness  Musculoskeletal:        General: Normal range of motion.     Cervical back: Normal range of motion.     Comments: No appreciable tenderness, no midline step-off or deformity noted, moving legs well, no strength or sensory deficit, ambulatory  Skin:    General: Skin is warm and dry.  Neurological:     Mental Status: She is alert and oriented to person, place, and time.     Coordination: Coordination normal.  Psychiatric:        Mood and Affect: Mood and affect normal.     ED Results / Procedures / Treatments   Labs (all labs ordered are listed, but only abnormal results are displayed) Labs Reviewed  URINALYSIS, ROUTINE W REFLEX MICROSCOPIC - Abnormal; Notable for the following components:      Result Value   APPearance HAZY (*)    Protein, ur 30 (*)    Leukocytes,Ua MODERATE (*)    Bacteria, UA FEW (*)    All other components within normal limits  BASIC METABOLIC PANEL - Abnormal; Notable for the following components:   Glucose, Bld 109 (*)     Creatinine, Ser 1.27 (*)    Calcium 8.8 (*)    GFR, Estimated 50 (*)    All other components within normal limits  CBC - Abnormal; Notable for the following components:   Hemoglobin 10.4 (*)    HCT 35.0 (*)    MCV 77.3 (*)    MCH 23.0 (*)    MCHC 29.7 (*)    All other components within normal limits    EKG None  Radiology No results found.  Procedures Procedures   Medications Ordered in ED Medications  cephALEXin (KEFLEX) capsule 500 mg (has no administration in time range)    ED Course  I have reviewed the triage vital signs and the nursing notes.  Pertinent labs & imaging results that were available during my care of the patient were reviewed by me and considered in my medical decision making (see chart for details).    MDM Rules/Calculators/A&P  55 year old female presenting to the ED with multiple complaints.  1.  Nosebleed--recurred earlier today from right nostril, resolved prior to arrival.  Does have some dried blood in the right nostril along the lateral nasal wall.  There is no septal hematoma or deformity.  No signs of trauma.  Hemoglobin appears around her baseline, platelets are normal.  No history of bleeding disorder and is not on anticoagulation.  Nasal passages do appear dry so I suspect this may be the etiology.  Can use saline nasal spray as needed.  2.  Urinary frequency--ongoing for few days, describes need to urinary frequently, even if only a small amount.  Has had some dysuria but denies hematuria, flank pain, or fever.  UA with bacteria and leuks noted.  As she is symptomatic, will treat with course of Keflex, first dose given here.  She has no hematuria or unilateral flank pain to suggest acute stone and I doubt pyelonephritis.  3.  Back pain--chronic issue for her.  States it is in the middle of her low back and radiates up and down.  She is not had any new falls or trauma.  No real acute changes in her pain, just ongoing.  Has seen Ortho care  for this before and told it was related to arthritis but she is doubtful of this.  She has no focal neurologic deficits today.  No red flag symptoms concerning for cauda equina.  Can continue her usual management for this.  Can follow back up with Ortho care if desired.  Final Clinical Impression(s) / ED Diagnoses Final diagnoses:  Urinary frequency  Chronic midline low back pain without sciatica  Nosebleed    Rx / DC Orders ED Discharge Orders         Ordered    cephALEXin (KEFLEX) 500 MG capsule  2 times daily        06/23/20 0605           Larene Pickett, PA-C 06/23/20 [id number]    Ripley Fraise, MD 06/23/20 [phone]

## 2020-06-23 NOTE — ED Notes (Signed)
Patient verbalizes understanding of discharge instructions. Opportunity for questioning and answers were provided. Armband removed by staff, pt discharged from ED and ambulated to lobby to return home.   

## 2020-06-29 ENCOUNTER — Ambulatory Visit: Payer: Self-pay | Admitting: Internal Medicine

## 2020-10-03 ENCOUNTER — Other Ambulatory Visit: Payer: Self-pay

## 2020-10-03 ENCOUNTER — Encounter (HOSPITAL_COMMUNITY): Payer: Self-pay

## 2020-10-03 ENCOUNTER — Observation Stay (HOSPITAL_COMMUNITY)
Admission: EM | Admit: 2020-10-03 | Discharge: 2020-10-04 | DRG: 282 | Disposition: A | Discharge status: Home / Self Care | Payer: Medicaid Other | Attending: Cardiology | Admitting: Cardiology

## 2020-10-03 ENCOUNTER — Emergency Department (HOSPITAL_COMMUNITY): Payer: Medicaid Other

## 2020-10-03 DIAGNOSIS — E785 Hyperlipidemia, unspecified: Secondary | ICD-10-CM | POA: Diagnosis not present

## 2020-10-03 DIAGNOSIS — Z20822 Contact with and (suspected) exposure to covid-19: Secondary | ICD-10-CM | POA: Diagnosis present

## 2020-10-03 DIAGNOSIS — R739 Hyperglycemia, unspecified: Secondary | ICD-10-CM | POA: Diagnosis not present

## 2020-10-03 DIAGNOSIS — D649 Anemia, unspecified: Secondary | ICD-10-CM | POA: Diagnosis not present

## 2020-10-03 DIAGNOSIS — Z9049 Acquired absence of other specified parts of digestive tract: Secondary | ICD-10-CM

## 2020-10-03 DIAGNOSIS — N1832 Chronic kidney disease, stage 3b: Secondary | ICD-10-CM | POA: Diagnosis not present

## 2020-10-03 DIAGNOSIS — I129 Hypertensive chronic kidney disease with stage 1 through stage 4 chronic kidney disease, or unspecified chronic kidney disease: Secondary | ICD-10-CM | POA: Diagnosis not present

## 2020-10-03 DIAGNOSIS — Z79899 Other long term (current) drug therapy: Secondary | ICD-10-CM | POA: Diagnosis not present

## 2020-10-03 DIAGNOSIS — I358 Other nonrheumatic aortic valve disorders: Secondary | ICD-10-CM | POA: Diagnosis present

## 2020-10-03 DIAGNOSIS — Z90710 Acquired absence of both cervix and uterus: Secondary | ICD-10-CM

## 2020-10-03 DIAGNOSIS — G8929 Other chronic pain: Secondary | ICD-10-CM | POA: Diagnosis not present

## 2020-10-03 DIAGNOSIS — N289 Disorder of kidney and ureter, unspecified: Secondary | ICD-10-CM

## 2020-10-03 DIAGNOSIS — G43909 Migraine, unspecified, not intractable, without status migrainosus: Secondary | ICD-10-CM | POA: Diagnosis not present

## 2020-10-03 DIAGNOSIS — I444 Left anterior fascicular block: Secondary | ICD-10-CM | POA: Diagnosis present

## 2020-10-03 DIAGNOSIS — R0602 Shortness of breath: Secondary | ICD-10-CM | POA: Diagnosis present

## 2020-10-03 DIAGNOSIS — R011 Cardiac murmur, unspecified: Secondary | ICD-10-CM | POA: Diagnosis present

## 2020-10-03 DIAGNOSIS — M542 Cervicalgia: Secondary | ICD-10-CM | POA: Diagnosis present

## 2020-10-03 DIAGNOSIS — I10 Essential (primary) hypertension: Secondary | ICD-10-CM | POA: Diagnosis present

## 2020-10-03 DIAGNOSIS — I214 Non-ST elevation (NSTEMI) myocardial infarction: Secondary | ICD-10-CM | POA: Diagnosis not present

## 2020-10-03 DIAGNOSIS — M25519 Pain in unspecified shoulder: Secondary | ICD-10-CM | POA: Diagnosis not present

## 2020-10-03 DIAGNOSIS — D62 Acute posthemorrhagic anemia: Secondary | ICD-10-CM | POA: Diagnosis present

## 2020-10-03 LAB — BASIC METABOLIC PANEL
Anion gap: 7 (ref 5–15)
BUN: 15 mg/dL (ref 6–20)
CO2: 23 mmol/L (ref 22–32)
Calcium: 8.9 mg/dL (ref 8.9–10.3)
Chloride: 108 mmol/L (ref 98–111)
Creatinine, Ser: 1.55 mg/dL — ABNORMAL HIGH (ref 0.44–1.00)
GFR, Estimated: 39 mL/min — ABNORMAL LOW (ref >60)
Glucose, Bld: 128 mg/dL — ABNORMAL HIGH (ref 70–99)
Potassium: 3.5 mmol/L (ref 3.5–5.1)
Sodium: 138 mmol/L (ref 135–145)

## 2020-10-03 LAB — CBC
HCT: 35.6 % — ABNORMAL LOW (ref 36.0–46.0)
Hemoglobin: 10.8 g/dL — ABNORMAL LOW (ref 12.0–15.0)
MCH: 23.4 pg — ABNORMAL LOW (ref 26.0–34.0)
MCHC: 30.3 g/dL (ref 30.0–36.0)
MCV: 77.2 fL — ABNORMAL LOW (ref 80.0–100.0)
Platelets: 194 10*3/uL (ref 150–400)
RBC: 4.61 MIL/uL (ref 3.87–5.11)
RDW: 14.6 % (ref 11.5–15.5)
WBC: 5.9 10*3/uL (ref 4.0–10.5)
nRBC: 0 % (ref 0.0–0.2)

## 2020-10-03 LAB — I-STAT BETA HCG BLOOD, ED (MC, WL, AP ONLY): I-stat hCG, quantitative: <5 m[IU]/mL (ref <5)

## 2020-10-03 LAB — TROPONIN I (HIGH SENSITIVITY): Troponin I (High Sensitivity): 40 ng/L — ABNORMAL HIGH (ref <18)

## 2020-10-03 NOTE — ED Provider Notes (Signed)
[facility]ERG[facility] DEPARTMENT Provider Note   CSN: [id number] Arrival date & time: 10/03/20  2025     History Chief Complaint  Patient presents with  . Shortness of Breath    Marissa Warner is a 55 y.o. female.  Patient presents after an episode of shortness of breath.  Patient reports that she was watching TV when she suddenly got very weak and was shaking all over.  She felt short of breath and had some slight pain in her chest.  She called her daughter who came and brought her to the ER.  She was still feeling this way when she arrived at the ER, then symptoms resolved.  She thinks total length of episode was approximately 1 hour.  Currently she is breathing fine and does not have any chest pain.  Her only complaint is her chronic right-sided neck and arm pain which has been attributed to cervical spine disease.  She is being worked up by Ortho.        Past Medical History:  Diagnosis Date  . Allergy    environmental and seasonal  . Blood transfusion without reported diagnosis before 2004   due to heavy periods  . Hypertension   . Migraines 2007  . Renal disorder    Pt. states "Stage III Kidney disease"  Never had kidney biopsy.  She is unable to tell me what the cause of the kidney disease.  Reportedly diagnosed with [facility] in [place].    Patient Active Problem List   Diagnosis Date Noted  . Right arm numbness 01/07/2020  . Neck pain 12/25/2019  . Rotator cuff syndrome of right shoulder 12/16/2019  . Constipation 03/05/2019  . Gastroesophageal reflux disease without esophagitis 03/05/2019  . Hyperglycemia 12/23/2016  . Anemia 03/02/2016  . Chronic pain of right knee 03/02/2016  . Essential hypertension 01/20/2016  . Allergy   . Hypertension   . Renal disorder     Past Surgical History:  Procedure Laterality Date  . ABDOMINAL HYSTERECTOMY  2004   With BSO, she thinks  . BIOPSY  09/09/2019   Procedure: BIOPSY;  Surgeon: Ronnette Juniper, MD;  Location: [facility];  Service: Gastroenterology;;  . CHOLECYSTECTOMY  uncertain date   Laparoscopic  . COLONOSCOPY WITH PROPOFOL N/A 09/09/2019   Procedure: COLONOSCOPY WITH PROPOFOL;  Surgeon: Ronnette Juniper, MD;  Location: [facility];  Service: Gastroenterology;  Laterality: N/A;  . ESOPHAGOGASTRODUODENOSCOPY (EGD) WITH PROPOFOL N/A 09/09/2019   Procedure: ESOPHAGOGASTRODUODENOSCOPY (EGD) WITH PROPOFOL;  Surgeon: Ronnette Juniper, MD;  Location: [facility];  Service: Gastroenterology;  Laterality: N/A;  . LAPAROSCOPIC INCISIONAL / UMBILICAL / [place]     OB History   No obstetric history on file.     History reviewed. No pertinent family history.  Social History   Tobacco Use  . Smoking status: Never Smoker  . Smokeless tobacco: Current User    Types: Chew  . Tobacco comment: down to once daily or every other day:  8/19  Substance Use Topics  . Alcohol use: No  . Drug use: No    Home Medications Prior to Admission medications   Medication Sig Start Date End Date Taking? Authorizing Provider  acetaminophen (TYLENOL) 500 MG tablet Take 500 mg by mouth every 6 (six) hours as needed for mild pain.   Yes [provider]  cetirizine (ZYRTEC) 10 MG tablet Take 10 mg by mouth daily as needed for allergies.   Yes [provider]  cyclobenzaprine (FLEXERIL) 10 MG tablet 1/2 to 1 tab by mouth every 8 hours as needed for shoulder and neck pain Patient taking differently: Take 10 mg by mouth every 8 (eight) hours as needed (shoulder and neck pain). 01/19/18  Yes Mack Hook, MD  diclofenac Sodium (VOLTAREN) 1 % GEL Apply 4 g topically 4 (four) times daily. 06/03/19  Yes Mack Hook, MD  Ferrous Gluconate 324 (37.5 Fe) MG TABS Take 324 mg by mouth 2 (two) times daily.    Yes [provider]  guaiFENesin (MUCINEX) 600 MG 12 hr tablet Take 600 mg by mouth 2 (two) times daily as needed for cough.   Yes [provider]  lisinopril (ZESTRIL) 10 MG tablet Take 1 tablet (10 mg total) by mouth daily. 12/16/19  Yes Mack Hook, MD  omeprazole (PRILOSEC) 40 MG capsule Take 1 capsule (40 mg total) by mouth daily. 03/05/19  Yes Mack Hook, MD  pantoprazole (PROTONIX) 40 MG tablet Take 40 mg by mouth daily. 09/12/19  Yes [provider]  Polyvinyl Alcohol-Povidone (REFRESH OP) Place 1 drop into both eyes daily as needed (dry eyes).   Yes [provider]  psyllium (METAMUCIL SMOOTH TEXTURE) 58.6 % powder Take 1 packet by mouth daily. Patient taking differently: Take 1 packet by mouth daily as needed (constipation). 03/05/19  Yes Mack Hook, MD  SUMAtriptan (IMITREX) 50 MG tablet 1 tab by mouth for migraine headache, may repeat once in 2 hours if not relieved Patient taking differently: Take 50 mg by mouth every 2 (two) hours as needed for migraine. 01/19/18  Yes Mack Hook, MD  Vitamin D, Ergocalciferol, (DRISDOL) 1.25 MG (50000 UNIT) CAPS capsule Take 50,000 Units by mouth every Monday.    Yes [provider]  cephALEXin (KEFLEX) 500 MG capsule Take 1 capsule (500 mg total) by mouth 2 (two) times daily. Patient not taking: No sig reported 06/23/20   Larene Pickett, PA-C    Allergies    Patient has no known allergies.  Review of Systems   Review of Systems  Respiratory: Positive for shortness of breath.   Cardiovascular: Positive for chest pain.  Neurological: Positive for tremors.  All other systems reviewed and are negative.   Physical Exam Updated Vital Signs BP 130/90   Pulse 91   Temp 98.8 F (37.1 C) (Oral)   Resp 18   Ht 5\' 5"  (1.651 m)   Wt 88.5 kg   SpO2 99%   BMI 32.45 kg/m   Physical Exam Vitals and nursing note reviewed.  Constitutional:      General: She is not in acute distress.    Appearance: Normal appearance. She is well-developed.  HENT:     Head: Normocephalic and atraumatic.     Right Ear: Hearing normal.     Left  Ear: Hearing normal.     Nose: Nose normal.  Eyes:     Conjunctiva/sclera: Conjunctivae normal.     Pupils: Pupils are equal, round, and reactive to light.  Neck:   Cardiovascular:     Rate and Rhythm: Regular rhythm.     Heart sounds: S1 normal and S2 normal. No murmur heard. No friction rub. No gallop.   Pulmonary:     Effort: Pulmonary effort is normal. No respiratory distress.     Breath sounds: Normal breath sounds.  Chest:     Chest wall: No tenderness.  Abdominal:     General: Bowel sounds are normal.     Palpations: Abdomen is soft.  Tenderness: There is no abdominal tenderness. There is no guarding or rebound. Negative signs include Murphy's sign and McBurney's sign.     Hernia: No hernia is present.  Musculoskeletal:        General: Normal range of motion.     Cervical back: Normal range of motion and neck supple. Muscular tenderness present.  Skin:    General: Skin is warm and dry.     Findings: No rash.  Neurological:     Mental Status: She is alert and oriented to person, place, and time.     GCS: GCS eye subscore is 4. GCS verbal subscore is 5. GCS motor subscore is 6.     Cranial Nerves: No cranial nerve deficit.     Sensory: No sensory deficit.     Coordination: Coordination normal.  Psychiatric:        Speech: Speech normal.        Behavior: Behavior normal.        Thought Content: Thought content normal.     ED Results / Procedures / Treatments   Labs (all labs ordered are listed, but only abnormal results are displayed) Labs Reviewed  BASIC METABOLIC PANEL - Abnormal; Notable for the following components:      Result Value   Glucose, Bld 128 (*)    Creatinine, Ser 1.55 (*)    GFR, Estimated 39 (*)    All other components within normal limits  CBC - Abnormal; Notable for the following components:   Hemoglobin 10.8 (*)    HCT 35.6 (*)    MCV 77.2 (*)    MCH 23.4 (*)    All other components within normal limits  D-DIMER, QUANTITATIVE -  Abnormal; Notable for the following components:   D-Dimer, Quant 2.90 (*)    All other components within normal limits  TROPONIN I (HIGH SENSITIVITY) - Abnormal; Notable for the following components:   Troponin I (High Sensitivity) 40 (*)    All other components within normal limits  TROPONIN I (HIGH SENSITIVITY) - Abnormal; Notable for the following components:   Troponin I (High Sensitivity) 177 (*)    All other components within normal limits  RESP PANEL BY RT-PCR (FLU A&B, COVID) ARPGX2  HEPARIN LEVEL (UNFRACTIONATED)  I-STAT BETA HCG BLOOD, ED ([facility], [facility], [facility] ONLY)    EKG EKG Interpretation  Date/Time:  Saturday Oct 03 2020 21:25:14 EDT Ventricular Rate:  109 PR Interval:  174 QRS Duration: 72 QT Interval:  358 QTC Calculation: 482 R Axis:   -37 Text Interpretation: Sinus tachycardia Left axis deviation Minimal voltage criteria for LVH, may be normal variant ( R in aVL ) Possible Anterior infarct , age undetermined Abnormal ECG Sinus tachycardia, similar to previous tracing Confirmed by Lavenia Atlas [phone]) on 10/03/2020 10:30:29 PM   Radiology DG Chest 2 View  Result Date: 10/03/2020 CLINICAL DATA:  Shortness of breath EXAM: CHEST - 2 VIEW COMPARISON:  August 08, 2019 FINDINGS: Lungs are clear. Heart size and pulmonary vascularity are normal. No adenopathy. No pneumothorax. No bone lesions. IMPRESSION: Lungs clear.  Cardiac silhouette normal. Electronically Signed   By: Lowella Grip III M.D.   On: 10/03/2020 21:58   CT ANGIO CHEST PE W OR WO CONTRAST  Result Date: 10/04/2020 CLINICAL DATA:  Pain and shortness of breath concern for pulmonary embolus EXAM: CT ANGIOGRAPHY CHEST WITH CONTRAST TECHNIQUE: Multidetector CT imaging of the chest was performed using the standard protocol during bolus administration of intravenous contrast. Multiplanar CT image reconstructions and  MIPs were obtained to evaluate the vascular anatomy. CONTRAST:  53mL OMNIPAQUE IOHEXOL 350 MG/ML SOLN  COMPARISON:  Chest radiograph Oct 08, 2019 FINDINGS: Cardiovascular: Satisfactory opacification of the pulmonary arteries to the segmental level. No evidence of pulmonary embolism. Normal heart size. No pericardial effusion. Mediastinum/Nodes: No enlarged mediastinal, hilar, or axillary lymph nodes. Thyroid gland, trachea, and esophagus demonstrate no significant findings. Lungs/Pleura: Lungs are clear. No pleural effusion or pneumothorax. Upper Abdomen: No acute abnormality. Musculoskeletal: No chest wall abnormality. No acute or significant osseous findings. Review of the MIP images confirms the above findings. IMPRESSION: Negative examination for pulmonary embolism or other acute intrathoracic process. Electronically Signed   By: Dahlia Bailiff MD   On: 10/04/2020 02:28    Procedures Procedures   Medications Ordered in ED Medications  heparin ADULT infusion 100 units/mL (25000 units/278mL) (1,000 Units/hr Intravenous New Bag/Given 10/04/20 0224)  sodium chloride 0.9 % bolus 500 mL (500 mLs Intravenous New Bag/Given 10/04/20 0222)  heparin bolus via infusion 3,000 Units (3,000 Units Intravenous Bolus from Bag 10/04/20 0224)  iohexol (OMNIPAQUE) 350 MG/ML injection 50 mL (50 mLs Intravenous Contrast Given 10/04/20 0222)  oxyCODONE-acetaminophen (PERCOCET/ROXICET) 5-325 MG per tablet 1 tablet (1 tablet Oral Given 10/04/20 0258)    ED Course  I have reviewed the triage vital signs and the nursing notes.  Pertinent labs & imaging results that were available during my care of the patient were reviewed by me and considered in my medical decision making (see chart for details).    MDM Rules/Calculators/A&P                          Patient presents to the emergency department for evaluation of chest pain and shortness of breath.  Patient had somewhat sudden onset of shortness of breath while watching TV tonight.  She did have some associated left sided chest pain.  Symptoms were estimated to last around  1 hour and have improved.  At arrival vital signs revealed tachycardia but otherwise no abnormality.  EKG did not show any obvious ischemia and no ST elevations.  First troponin, however was elevated at 40.  With a somewhat sudden onset of shortness of breath at rest, PE was considered.  Heparin initiated.  D-dimer was markedly elevated.  Patient therefore underwent CT scan which does not show any acute intrathoracic pathology.  Second troponin further elevated at 177.  Current presentation consistent with NSTEMI.    CRITICAL CARE Performed by: Orpah Greek   Total critical care time: 35 minutes  Critical care time was exclusive of separately billable procedures and treating other patients.  Critical care was necessary to treat or prevent imminent or life-threatening deterioration.  Critical care was time spent personally by me on the following activities: development of treatment plan with patient and/or surrogate as well as nursing, discussions with consultants, evaluation of patient's response to treatment, examination of patient, obtaining history from patient or surrogate, ordering and performing treatments and interventions, ordering and review of laboratory studies, ordering and review of radiographic studies, pulse oximetry and re-evaluation of patient's condition.   Final Clinical Impression(s) / ED Diagnoses Final diagnoses:  NSTEMI (non-ST elevated myocardial infarction) [facility])    Rx / DC Orders ED Discharge Orders    None       Cherokee Boccio, Gwenyth Allegra, MD 10/04/20 [phone]

## 2020-10-03 NOTE — ED Triage Notes (Signed)
Pt c/o shaking and shortness of breath. Pt states this came on all of a sudden around a few minutes ago. Pt states she felt like she couldn't control her movements. Pt has pain in neck and right arm from a cervical injury that has hurt for past year. Pt denies any other pain, has had nausea.

## 2020-10-04 ENCOUNTER — Other Ambulatory Visit: Payer: Self-pay | Admitting: Physician Assistant

## 2020-10-04 ENCOUNTER — Inpatient Hospital Stay (HOSPITAL_COMMUNITY): Payer: Medicaid Other

## 2020-10-04 ENCOUNTER — Emergency Department (HOSPITAL_COMMUNITY): Payer: Medicaid Other

## 2020-10-04 ENCOUNTER — Other Ambulatory Visit: Payer: Self-pay

## 2020-10-04 DIAGNOSIS — I214 Non-ST elevation (NSTEMI) myocardial infarction: Secondary | ICD-10-CM

## 2020-10-04 DIAGNOSIS — N183 Chronic kidney disease, stage 3 unspecified: Secondary | ICD-10-CM

## 2020-10-04 DIAGNOSIS — R739 Hyperglycemia, unspecified: Secondary | ICD-10-CM

## 2020-10-04 DIAGNOSIS — E785 Hyperlipidemia, unspecified: Secondary | ICD-10-CM

## 2020-10-04 DIAGNOSIS — I10 Essential (primary) hypertension: Secondary | ICD-10-CM

## 2020-10-04 DIAGNOSIS — R079 Chest pain, unspecified: Secondary | ICD-10-CM

## 2020-10-04 DIAGNOSIS — R9431 Abnormal electrocardiogram [ECG] [EKG]: Secondary | ICD-10-CM

## 2020-10-04 LAB — ECHOCARDIOGRAM COMPLETE
AR max vel: 2.1 cm2
AV Area VTI: 2.38 cm2
AV Area mean vel: 2.31 cm2
AV Mean grad: 4 mmHg
AV Peak grad: 7.7 mmHg
Ao pk vel: 1.39 m/s
Area-P 1/2: 2.6 cm2
Height: 65 in
MV VTI: 2.56 cm2
S' Lateral: 2 cm
Weight: 3121.71 oz

## 2020-10-04 LAB — TROPONIN I (HIGH SENSITIVITY)
Troponin I (High Sensitivity): 177 ng/L (ref <18)
Troponin I (High Sensitivity): 318 ng/L (ref <18)
Troponin I (High Sensitivity): 271 ng/L (ref <18)

## 2020-10-04 LAB — BRAIN NATRIURETIC PEPTIDE: B Natriuretic Peptide: 101.6 pg/mL — ABNORMAL HIGH (ref 0.0–100.0)

## 2020-10-04 LAB — NM MYOCAR MULTI W/SPECT W/WALL MOTION / EF
Estimated workload: 1 METS
MPHR: 164 {beats}/min
Peak HR: 131 {beats}/min
Percent HR: 79 %
Rest HR: 76 {beats}/min
TID: 1.01

## 2020-10-04 LAB — RESP PANEL BY RT-PCR (FLU A&B, COVID) ARPGX2
Influenza A by PCR: NEGATIVE
Influenza B by PCR: NEGATIVE
SARS Coronavirus 2 by RT PCR: NEGATIVE

## 2020-10-04 LAB — D-DIMER, QUANTITATIVE: D-Dimer, Quant: 2.9 ug/mL-FEU — ABNORMAL HIGH (ref 0.00–0.50)

## 2020-10-04 LAB — HEPARIN LEVEL (UNFRACTIONATED): Heparin Unfractionated: 0.51 IU/mL (ref 0.30–0.70)

## 2020-10-04 LAB — TSH: TSH: 1.382 u[IU]/mL (ref 0.350–4.500)

## 2020-10-04 LAB — HEMOGLOBIN A1C
Hgb A1c MFr Bld: 5.4 % (ref 4.8–5.6)
Mean Plasma Glucose: 108.28 mg/dL

## 2020-10-04 LAB — T4, FREE: Free T4: 1.03 ng/dL (ref 0.61–1.12)

## 2020-10-04 LAB — MRSA PCR SCREENING: MRSA by PCR: NEGATIVE

## 2020-10-04 MED ORDER — ASPIRIN 81 MG PO CHEW
324.0000 mg | CHEWABLE_TABLET | ORAL | Status: AC
  Administered 2020-10-04: 324 mg via ORAL
  Filled 2020-10-04: qty 4

## 2020-10-04 MED ORDER — ONDANSETRON HCL 4 MG/2ML IJ SOLN: 4.0000 mg | Freq: Four times a day (QID) | INTRAMUSCULAR | Status: DC | PRN

## 2020-10-04 MED ORDER — REGADENOSON 0.4 MG/5ML IV SOLN
INTRAVENOUS | Status: AC
  Filled 2020-10-04: qty 5

## 2020-10-04 MED ORDER — IOHEXOL 350 MG/ML SOLN
50.0000 mL | Freq: Once | INTRAVENOUS | Status: AC | PRN
  Administered 2020-10-04: 50 mL via INTRAVENOUS

## 2020-10-04 MED ORDER — ACETAMINOPHEN 325 MG PO TABS
650.0000 mg | ORAL_TABLET | ORAL | Status: DC | PRN
  Administered 2020-10-04: 650 mg via ORAL
  Filled 2020-10-04: qty 2

## 2020-10-04 MED ORDER — REGADENOSON 0.4 MG/5ML IV SOLN
0.4000 mg | Freq: Once | INTRAVENOUS | Status: AC
  Administered 2020-10-04: 0.4 mg via INTRAVENOUS
  Filled 2020-10-04: qty 5

## 2020-10-04 MED ORDER — ATORVASTATIN CALCIUM 40 MG PO TABS
40.0000 mg | ORAL_TABLET | Freq: Every day | ORAL | Status: DC
  Administered 2020-10-04: 40 mg via ORAL
  Filled 2020-10-04: qty 1

## 2020-10-04 MED ORDER — OXYCODONE-ACETAMINOPHEN 5-325 MG PO TABS
1.0000 | ORAL_TABLET | Freq: Once | ORAL | Status: AC
  Administered 2020-10-04: 1 via ORAL
  Filled 2020-10-04: qty 1

## 2020-10-04 MED ORDER — HEPARIN BOLUS VIA INFUSION
3000.0000 [IU] | Freq: Once | INTRAVENOUS | Status: AC
  Administered 2020-10-04: 3000 [IU] via INTRAVENOUS
  Filled 2020-10-04: qty 3000

## 2020-10-04 MED ORDER — METOPROLOL TARTRATE 12.5 MG HALF TABLET
12.5000 mg | ORAL_TABLET | Freq: Two times a day (BID) | ORAL | Status: DC
  Administered 2020-10-04: 12.5 mg via ORAL
  Filled 2020-10-04: qty 1

## 2020-10-04 MED ORDER — SODIUM CHLORIDE 0.9 % IV BOLUS
500.0000 mL | Freq: Once | INTRAVENOUS | Status: AC
  Administered 2020-10-04: 500 mL via INTRAVENOUS

## 2020-10-04 MED ORDER — NITROGLYCERIN 0.4 MG SL SUBL
0.4000 mg | SUBLINGUAL_TABLET | SUBLINGUAL | Status: DC | PRN
Start: 2020-10-04 — End: 2020-10-04

## 2020-10-04 MED ORDER — POTASSIUM CHLORIDE CRYS ER 20 MEQ PO TBCR
20.0000 meq | EXTENDED_RELEASE_TABLET | Freq: Once | ORAL | Status: AC
  Administered 2020-10-04: 20 meq via ORAL
  Filled 2020-10-04: qty 1

## 2020-10-04 MED ORDER — ASPIRIN EC 81 MG PO TBEC
81.0000 mg | DELAYED_RELEASE_TABLET | Freq: Every day | ORAL | Status: DC
  Administered 2020-10-04: 81 mg via ORAL
  Filled 2020-10-04: qty 1

## 2020-10-04 MED ORDER — ASPIRIN 300 MG RE SUPP
300.0000 mg | RECTAL | Status: AC
Start: 2020-10-04 — End: 2020-10-04

## 2020-10-04 MED ORDER — TECHNETIUM TC 99M TETROFOSMIN IV KIT
11.0000 | PACK | Freq: Once | INTRAVENOUS | Status: AC | PRN
  Administered 2020-10-04: 11 via INTRAVENOUS

## 2020-10-04 MED ORDER — HEPARIN (PORCINE) 25000 UT/250ML-% IV SOLN
1000.0000 [IU]/h | INTRAVENOUS | Status: DC
  Administered 2020-10-04: 1000 [IU]/h via INTRAVENOUS
  Filled 2020-10-04: qty 250

## 2020-10-04 NOTE — Progress Notes (Signed)
  Echocardiogram 2D Echocardiogram has been performed.  Marissa Warner 10/04/2020, 2:51 PM

## 2020-10-04 NOTE — ED Notes (Signed)
Patient transported to CT 

## 2020-10-04 NOTE — Progress Notes (Signed)
ANTICOAGULATION CONSULT NOTE - Initial Consult  Pharmacy Consult for heparin Indication: chest pain/ACS  No Known Allergies  Patient Measurements: Height: 5\' 5"  (165.1 cm) Weight: 88.5 kg (195 lb) IBW/kg (Calculated) : 57 Heparin Dosing Weight: 75kg  Vital Signs: Temp: 98.8 F (37.1 C) (05/14 2121) Temp Source: Oral (05/14 2121) BP: 129/84 (05/15 0130) Pulse Rate: 85 (05/15 0100)  Labs: Recent Labs    10/03/20 2140 10/03/20 2336  HGB 10.8*  --   HCT 35.6*  --   PLT 194  --   CREATININE 1.55*  --   TROPONINIHS 40* 177*    Estimated Creatinine Clearance: 44.5 mL/min (A) (by C-G formula based on SCr of 1.55 mg/dL (H)).   Medical History: Past Medical History:  Diagnosis Date  . Allergy    environmental and seasonal  . Blood transfusion without reported diagnosis before 2004   due to heavy periods  . Hypertension   . Migraines 2007  . Renal disorder    Pt. states "Stage III Kidney disease"  Never had kidney biopsy.  She is unable to tell me what the cause of the kidney disease.  Reportedly diagnosed with [facility] in [place].    Assessment: [phone] female c/o sudden weakness and SOB with mild CP, initial troponin elevated and rising, to begin heparin.  Goal of Therapy:  Heparin level 0.3-0.7 units/ml Monitor platelets by anticoagulation protocol: Yes   Plan:  Will give heparin 3000 units IV bolus x1 followed by gtt at 1000 units/hr and monitor heparin levels and CBC.  Wynona Neat, PharmD, BCPS  10/04/2020,1:40 AM

## 2020-10-04 NOTE — Discharge Summary (Signed)
Discharge Summary    Patient ID: Marissa GrimesYvonne Warner MRN: [id number]; DOB: 06-06-1965  Admit date: 10/03/2020 Discharge date: 10/04/2020  PCP:  Julieanne MansonMulberry, Elizabeth, MD   [facility] Providers Cardiologist:  {new - [place]   Discharge Diagnoses    Principal Problem:   NSTEMI (non-ST elevated myocardial infarction) [facility](HCC) Active Problems:   Essential hypertension   Hypertension   Renal disorder   Anemia   Hyperglycemia  Diagnostic Studies/Procedures    Echo 10/04/20: 1. Left ventricular ejection fraction, by estimation, is 60 to 65%. The  left ventricle has normal function. The left ventricle has no regional  wall motion abnormalities. There is mild left ventricular hypertrophy.  Left ventricular diastolic parameters  are indeterminate.  2. Right ventricular systolic function is normal. The right ventricular  size is normal. There is normal pulmonary artery systolic pressure. The  estimated right ventricular systolic pressure is 16.5 mmHg.  3. The mitral valve is grossly normal. Trivial mitral valve  regurgitation.  4. The aortic valve is tricuspid. Aortic valve regurgitation is not  visualized. Mild to moderate aortic valve sclerosis/calcification is  present, without any evidence of aortic stenosis. Aortic valve mean  gradient measures 4.0 mmHg.  5. The inferior vena cava is normal in size with greater than 50%  respiratory variability, suggesting right atrial pressure of 3 mmHg.  _____________   Nuclear stress test 10/04/20:  There was no ST segment deviation noted during stress.  The left ventricular ejection fraction is hyperdynamic (>65%).  Nuclear stress EF: 69%.  The study is normal.  This is a low risk study.   1. Normal perfusion 2. Normal systolic function, EF 69% 3. Low risk study   History of Present Illness     Marissa GrimesYvonne Warner is a 55 y.o. female with pmh sx for HTN, migraines, CKD, chronic neck/shoulder pain who is being seen 10/04/2020 for the  evaluation of possible ACS. She was doing fine until this afternoon when she felt sudden SOB while watching TV-this was accompanied by shaking and mild CP. She came to the ER because of these symptoms; however they resolved when she came here. Currently CP free. SOB resolved as well. However in the ED troponin elevated from 44 to 170s. No significant EKG changes. D dimer elevated. CT PE unremarkable. ED initiated treatment for NSTEMI with heparin.   Hospital Course     Consultants: none  NSTEMI Pt presented with somewhat atypical symptoms including chest discomfort. HS troponin 40 --> 177 --> 318 --> 271. EKG was nonischemic. D-dimer was positive at 2.9, CTA negative for PE. Echocardiogram with preserved EF 60-65%, mild LVH, and no significant valvular disease. Nuclear stress test showed normal perfusion. No further cardiac workup at this time. Discharge with plans for follow up in cardiology office. Will stop ASA, BB, and statin for now. No further chest pain. Will order 14 day zio monitor to be sent to daughter's house:  [address] [place], [place] [id number] Gwendalyn EgeQuenisha Browne - daughter, cell [phone]   TSH WNL BNP 102 A1c 5.4%   Hypertension Continue home lisinopril.   CKD Baseline creatinine 1.1-1.2. sCr today 1.55 - may be due to NPO status. Check BMP at follow up.    Anemia Hb 10.8 - has been stable over the past 12 mo. No active bleeding.    Follow up - she saw the overnight fellow and then Dr. Diona BrownerMcDowell. [place]. I have messaged our [facility] - DOD  will become her cardiologist.    Did the patient have an acute coronary syndrome (MI, NSTEMI, STEMI, etc) this admission?:  No.   The elevated Troponin was due to the acute medical illness (demand ischemia).      _____________  Discharge Vitals Blood pressure (!) 120/99, pulse 82, temperature 97.8 F (36.6 C), temperature source Oral, resp. rate 20, height 5\' 5"   (1.651 m), weight 88.5 kg, SpO2 99 %.  Filed Weights   10/04/20 0130 10/04/20 0629  Weight: 88.5 kg 88.5 kg    Labs & Radiologic Studies    CBC Recent Labs    10/03/20 2140  WBC 5.9  HGB 10.8*  HCT 35.6*  MCV 77.2*  PLT 009   Basic Metabolic Panel Recent Labs    10/03/20 2140  NA 138  K 3.5  CL 108  CO2 23  GLUCOSE 128*  BUN 15  CREATININE 1.55*  CALCIUM 8.9   Liver Function Tests No results for input(s): AST, ALT, ALKPHOS, BILITOT, PROT, ALBUMIN in the last 72 hours. No results for input(s): LIPASE, AMYLASE in the last 72 hours. High Sensitivity Troponin:   Recent Labs  Lab 10/03/20 2140 10/03/20 2336 10/04/20 0508 10/04/20 0735  TROPONINIHS 40* 177* 318* 271*    BNP Invalid input(s): POCBNP D-Dimer Recent Labs    10/04/20 0014  DDIMER 2.90*   Hemoglobin A1C Recent Labs    10/04/20 0508  HGBA1C 5.4   Fasting Lipid Panel No results for input(s): CHOL, HDL, LDLCALC, TRIG, CHOLHDL, LDLDIRECT in the last 72 hours. Thyroid Function Tests Recent Labs    10/04/20 0508  TSH 1.382   _____________  DG Chest 2 View  Result Date: 10/03/2020 CLINICAL DATA:  Shortness of breath EXAM: CHEST - 2 VIEW COMPARISON:  August 08, 2019 FINDINGS: Lungs are clear. Heart size and pulmonary vascularity are normal. No adenopathy. No pneumothorax. No bone lesions. IMPRESSION: Lungs clear.  Cardiac silhouette normal. Electronically Signed   By: Lowella Grip III M.D.   On: 10/03/2020 21:58   CT ANGIO CHEST PE W OR WO CONTRAST  Result Date: 10/04/2020 CLINICAL DATA:  Pain and shortness of breath concern for pulmonary embolus EXAM: CT ANGIOGRAPHY CHEST WITH CONTRAST TECHNIQUE: Multidetector CT imaging of the chest was performed using the standard protocol during bolus administration of intravenous contrast. Multiplanar CT image reconstructions and MIPs were obtained to evaluate the vascular anatomy. CONTRAST:  61mL OMNIPAQUE IOHEXOL 350 MG/ML SOLN COMPARISON:  Chest  radiograph Oct 08, 2019 FINDINGS: Cardiovascular: Satisfactory opacification of the pulmonary arteries to the segmental level. No evidence of pulmonary embolism. Normal heart size. No pericardial effusion. Mediastinum/Nodes: No enlarged mediastinal, hilar, or axillary lymph nodes. Thyroid gland, trachea, and esophagus demonstrate no significant findings. Lungs/Pleura: Lungs are clear. No pleural effusion or pneumothorax. Upper Abdomen: No acute abnormality. Musculoskeletal: No chest wall abnormality. No acute or significant osseous findings. Review of the MIP images confirms the above findings. IMPRESSION: Negative examination for pulmonary embolism or other acute intrathoracic process. Electronically Signed   By: Dahlia Bailiff MD   On: 10/04/2020 02:28   NM Myocar Multi W/Spect W/Wall Motion / EF  Result Date: 10/04/2020  There was no ST segment deviation noted during stress.  The left ventricular ejection fraction is hyperdynamic (>65%).  Nuclear stress EF: 69%.  The study is normal.  This is a low risk study.  1. Normal perfusion 2. Normal systolic function, EF 38% 3. Low risk study   ECHOCARDIOGRAM COMPLETE  Result Date: 10/04/2020  ECHOCARDIOGRAM REPORT   Patient Name:   Marissa Warner Date of Exam: 10/04/2020 Medical Rec #:  [id number]    Height:       65.0 in Accession #:    [id number]   Weight:       195.1 lb Date of Birth:  15-Jan-1966    BSA:          1.957 m Patient Age:    57 years     BP:           120/99 mmHg Patient Gender: F            HR:           77 bpm. Exam Location:  Inpatient Procedure: 2D Echo, Cardiac Doppler and Color Doppler Indications:    Abnormal ECG  History:        Patient has no prior history of Echocardiogram examinations.                 Risk Factors:Hypertension. Hx syncope. CKD.  Sonographer:    Clayton Lefort RDCS (AE) Referring Phys: [id number] [facility]  Sonographer Comments: Suboptimal subcostal window. IMPRESSIONS  1. Left ventricular ejection fraction, by  estimation, is 60 to 65%. The left ventricle has normal function. The left ventricle has no regional wall motion abnormalities. There is mild left ventricular hypertrophy. Left ventricular diastolic parameters are indeterminate.  2. Right ventricular systolic function is normal. The right ventricular size is normal. There is normal pulmonary artery systolic pressure. The estimated right ventricular systolic pressure is 84.6 mmHg.  3. The mitral valve is grossly normal. Trivial mitral valve regurgitation.  4. The aortic valve is tricuspid. Aortic valve regurgitation is not visualized. Mild to moderate aortic valve sclerosis/calcification is present, without any evidence of aortic stenosis. Aortic valve mean gradient measures 4.0 mmHg.  5. The inferior vena cava is normal in size with greater than 50% respiratory variability, suggesting right atrial pressure of 3 mmHg. FINDINGS  Left Ventricle: Left ventricular ejection fraction, by estimation, is 60 to 65%. The left ventricle has normal function. The left ventricle has no regional wall motion abnormalities. The left ventricular internal cavity size was normal in size. There is  mild left ventricular hypertrophy. Left ventricular diastolic parameters are indeterminate. Right Ventricle: The right ventricular size is normal. No increase in right ventricular wall thickness. Right ventricular systolic function is normal. There is normal pulmonary artery systolic pressure. The tricuspid regurgitant velocity is 1.84 m/s, and  with an assumed right atrial pressure of 3 mmHg, the estimated right ventricular systolic pressure is 65.9 mmHg. Left Atrium: Left atrial size was normal in size. Right Atrium: Right atrial size was normal in size. Pericardium: There is no evidence of pericardial effusion. Presence of pericardial fat pad. Mitral Valve: The mitral valve is grossly normal. Trivial mitral valve regurgitation. MV peak gradient, 3.7 mmHg. The mean mitral valve gradient is  2.0 mmHg. Tricuspid Valve: The tricuspid valve is grossly normal. Tricuspid valve regurgitation is trivial. Aortic Valve: The aortic valve is tricuspid. There is mild aortic valve annular calcification. Aortic valve regurgitation is not visualized. Mild to moderate aortic valve sclerosis/calcification is present, without any evidence of aortic stenosis. Aortic  valve mean gradient measures 4.0 mmHg. Aortic valve peak gradient measures 7.7 mmHg. Aortic valve area, by VTI measures 2.38 cm. Pulmonic Valve: The pulmonic valve was grossly normal. Pulmonic valve regurgitation is trivial. Aorta: The aortic root is normal in size and structure. Venous: The inferior vena cava is normal  in size with greater than 50% respiratory variability, suggesting right atrial pressure of 3 mmHg. IAS/Shunts: No atrial level shunt detected by color flow Doppler.  LEFT VENTRICLE PLAX 2D LVIDd:         3.80 cm  Diastology LVIDs:         2.00 cm  LV e' medial:    7.07 cm/s LV PW:         1.40 cm  LV E/e' medial:  11.6 LV IVS:        1.30 cm  LV e' lateral:   11.20 cm/s LVOT diam:     1.90 cm  LV E/e' lateral: 7.3 LV SV:         60 LV SV Index:   31 LVOT Area:     2.84 cm  RIGHT VENTRICLE             IVC RV Basal diam:  2.10 cm     IVC diam: 1.40 cm RV S prime:     10.80 cm/s TAPSE (M-mode): 2.2 cm LEFT ATRIUM             Index       RIGHT ATRIUM           Index LA diam:        2.80 cm 1.43 cm/m  RA Area:     11.40 cm LA Vol (A2C):   41.4 ml 21.15 ml/m RA Volume:   21.30 ml  10.88 ml/m LA Vol (A4C):   32.5 ml 16.60 ml/m LA Biplane Vol: 38.2 ml 19.52 ml/m  AORTIC VALVE AV Area (Vmax):    2.10 cm AV Area (Vmean):   2.31 cm AV Area (VTI):     2.38 cm AV Vmax:           139.00 cm/s AV Vmean:          92.400 cm/s AV VTI:            0.253 m AV Peak Grad:      7.7 mmHg AV Mean Grad:      4.0 mmHg LVOT Vmax:         103.00 cm/s LVOT Vmean:        75.400 cm/s LVOT VTI:          0.212 m LVOT/AV VTI ratio: 0.84  AORTA Ao Root diam: 2.70 cm Ao  Asc diam:  2.90 cm MITRAL VALVE               TRICUSPID VALVE MV Area (PHT): 2.60 cm    TR Peak grad:   13.5 mmHg MV Area VTI:   2.56 cm    TR Vmax:        184.00 cm/s MV Peak grad:  3.7 mmHg MV Mean grad:  2.0 mmHg    SHUNTS MV Vmax:       0.97 m/s    Systemic VTI:  0.21 m MV Vmean:      63.8 cm/s   Systemic Diam: 1.90 cm MV Decel Time: 292 msec MV E velocity: 82.30 cm/s MV A velocity: 96.00 cm/s MV E/A ratio:  0.86 Rozann Lesches MD Electronically signed by Rozann Lesches MD Signature Date/Time: 10/04/2020/2:59:22 PM    Final    Disposition   Pt is being discharged home today in good condition.  Follow-up Plans & Appointments     Follow-up Information    [facility].   Specialty: Cardiology Why: Office will call with an  app at either the [facility] or [place] location. Contact information: [address] [place] [phone]             Discharge Instructions    Diet - low sodium heart healthy   Complete by: As directed    Increase activity slowly   Complete by: As directed       Discharge Medications   Allergies as of 10/04/2020   No Known Allergies     Medication List    STOP taking these medications   cephALEXin 500 MG capsule Commonly known as: KEFLEX     TAKE these medications   acetaminophen 500 MG tablet Commonly known as: TYLENOL Take 500 mg by mouth every 6 (six) hours as needed for mild pain.   cetirizine 10 MG tablet Commonly known as: ZYRTEC Take 10 mg by mouth daily as needed for allergies.   cyclobenzaprine 10 MG tablet Commonly known as: FLEXERIL 1/2 to 1 tab by mouth every 8 hours as needed for shoulder and neck pain What changed:   how much to take  how to take this  when to take this  reasons to take this  additional instructions   diclofenac Sodium 1 % Gel Commonly known as: VOLTAREN Apply 4 g topically 4 (four) times daily.   Ferrous Gluconate 324  (37.5 Fe) MG Tabs Take 324 mg by mouth 2 (two) times daily.   guaiFENesin 600 MG 12 hr tablet Commonly known as: MUCINEX Take 600 mg by mouth 2 (two) times daily as needed for cough.   lisinopril 10 MG tablet Commonly known as: ZESTRIL Take 1 tablet (10 mg total) by mouth daily.   Metamucil Smooth Texture 58.6 % powder Generic drug: psyllium Take 1 packet by mouth daily. What changed:   when to take this  reasons to take this   omeprazole 40 MG capsule Commonly known as: PRILOSEC Take 1 capsule (40 mg total) by mouth daily.   pantoprazole 40 MG tablet Commonly known as: PROTONIX Take 40 mg by mouth daily.   REFRESH OP Place 1 drop into both eyes daily as needed (dry eyes).   SUMAtriptan 50 MG tablet Commonly known as: IMITREX 1 tab by mouth for migraine headache, may repeat once in 2 hours if not relieved What changed:   how much to take  how to take this  when to take this  reasons to take this  additional instructions   Vitamin D (Ergocalciferol) 1.25 MG (50000 UNIT) Caps capsule Commonly known as: DRISDOL Take 50,000 Units by mouth every Monday.          Outstanding Labs/Studies   Monitor and follow up  Duration of Discharge Encounter   Greater than 30 minutes including physician time.  Signed, [place], [place] 10/04/2020, 3:54 PM

## 2020-10-04 NOTE — H&P (Signed)
Cardiology Admission History and Physical:   Patient ID: Marissa Warner MRN: [id number]; DOB: [birth year]   Admission date: 10/03/2020  PCP:  Mack Hook, MD   [facility] Providers Cardiologist:  None        Chief Complaint:  SOB  Patient Profile and HPI:   Marissa Warner is a 55 y.o. female with with pmh sx for HTN, migraines, CKD, chronic neck/shoulder pain who is being seen 10/04/2020 for the evaluation of possible ACS. She was doing fine till this afternoon when she felt sudden SOB while watching TV-this was accompanied by shaking and mild CP. She came to the ER because of these symptoms; however they resolved when she came here. Currently CP free. SOB resolved as well. However in the ED troponin elevated from 44 to 170s. No significant EKG changes. D dimer elevated. CT PE unremarkable. ED initiated treatment for NSTEMI with heparin.   Past Medical History:  Diagnosis Date  . Allergy    environmental and seasonal  . Blood transfusion without reported diagnosis before 2004   due to heavy periods  . Hypertension   . Migraines 2007  . Renal disorder    Pt. states "Stage III Kidney disease"  Never had kidney biopsy.  She is unable to tell me what the cause of the kidney disease.  Reportedly diagnosed with [facility] in [place].    Past Surgical History:  Procedure Laterality Date  . ABDOMINAL HYSTERECTOMY  2004   With BSO, she thinks  . BIOPSY  09/09/2019   Procedure: BIOPSY;  Surgeon: Ronnette Juniper, MD;  Location: [facility];  Service: Gastroenterology;;  . CHOLECYSTECTOMY  uncertain date   Laparoscopic  . COLONOSCOPY WITH PROPOFOL N/A 09/09/2019   Procedure: COLONOSCOPY WITH PROPOFOL;  Surgeon: Ronnette Juniper, MD;  Location: [facility];  Service: Gastroenterology;  Laterality: N/A;  . ESOPHAGOGASTRODUODENOSCOPY (EGD) WITH PROPOFOL N/A 09/09/2019   Procedure: ESOPHAGOGASTRODUODENOSCOPY (EGD) WITH PROPOFOL;  Surgeon: Ronnette Juniper, MD;  Location: [facility];   Service: Gastroenterology;  Laterality: N/A;  . LAPAROSCOPIC INCISIONAL / UMBILICAL / VENTRAL HERNIA REPAIR  2000     Medications Prior to Admission: Prior to Admission medications   Medication Sig Start Date End Date Taking? Authorizing Provider  acetaminophen (TYLENOL) 500 MG tablet Take 500 mg by mouth every 6 (six) hours as needed for mild pain.   Yes [provider]  cetirizine (ZYRTEC) 10 MG tablet Take 10 mg by mouth daily as needed for allergies.   Yes [provider]  cyclobenzaprine (FLEXERIL) 10 MG tablet 1/2 to 1 tab by mouth every 8 hours as needed for shoulder and neck pain Patient taking differently: Take 10 mg by mouth every 8 (eight) hours as needed (shoulder and neck pain). 01/19/18  Yes Mack Hook, MD  diclofenac Sodium (VOLTAREN) 1 % GEL Apply 4 g topically 4 (four) times daily. 06/03/19  Yes Mack Hook, MD  Ferrous Gluconate 324 (37.5 Fe) MG TABS Take 324 mg by mouth 2 (two) times daily.    Yes [provider]  guaiFENesin (MUCINEX) 600 MG 12 hr tablet Take 600 mg by mouth 2 (two) times daily as needed for cough.   Yes [provider]  lisinopril (ZESTRIL) 10 MG tablet Take 1 tablet (10 mg total) by mouth daily. 12/16/19  Yes Mack Hook, MD  omeprazole (PRILOSEC) 40 MG capsule Take 1 capsule (40 mg total) by mouth daily. 03/05/19  Yes Mack Hook, MD  pantoprazole (PROTONIX) 40 MG tablet Take 40 mg by mouth daily.  09/12/19  Yes [provider]  Polyvinyl Alcohol-Povidone (REFRESH OP) Place 1 drop into both eyes daily as needed (dry eyes).   Yes [provider]  psyllium (METAMUCIL SMOOTH TEXTURE) 58.6 % powder Take 1 packet by mouth daily. Patient taking differently: Take 1 packet by mouth daily as needed (constipation). 03/05/19  Yes Mack Hook, MD  SUMAtriptan (IMITREX) 50 MG tablet 1 tab by mouth for migraine headache, may repeat once in 2 hours if not relieved Patient taking  differently: Take 50 mg by mouth every 2 (two) hours as needed for migraine. 01/19/18  Yes Mack Hook, MD  Vitamin D, Ergocalciferol, (DRISDOL) 1.25 MG (50000 UNIT) CAPS capsule Take 50,000 Units by mouth every Monday.    Yes [provider]  cephALEXin (KEFLEX) 500 MG capsule Take 1 capsule (500 mg total) by mouth 2 (two) times daily. Patient not taking: No sig reported 06/23/20   Larene Pickett, PA-C     Allergies:   No Known Allergies  Social History:   Social History   Socioeconomic History  . Marital status: Single    Spouse name: Not on file  . Number of children: 4  . Years of education: 50  . Highest education level: Not on file  Occupational History  . Occupation: unemployed    Comment: Previously in housekeeping  Tobacco Use  . Smoking status: Never Smoker  . Smokeless tobacco: Current User    Types: Chew  . Tobacco comment: down to once daily or every other day:  8/19  Substance and Sexual Activity  . Alcohol use: No  . Drug use: No  . Sexual activity: Never    Partners: Male  Other Topics Concern  . Not on file  Social History Narrative   Originally from [place], [place] far from [place], [place]   [place] to [place] in March 2017 to be near daughter.   Lives alone near [place].   Social Determinants of Health   Financial Resource Strain: Not on file  Food Insecurity: Not on file  Transportation Needs: Not on file  Physical Activity: Not on file  Stress: Not on file  Social Connections: Not on file  Intimate Partner Violence: Not on file    Family History: No hx of sudden cardiac death  ROS:  Please see the history of present illness.  All other ROS reviewed and negative.     Physical Exam/Data:   Vitals:   10/04/20 0230 10/04/20 0300 10/04/20 0330 10/04/20 0400  BP: (!) 143/90 130/90 128/72 134/81  Pulse: 91 91 85 84  Resp: 20 18 17 13   Temp:      TempSrc:      SpO2: 99% 99% 99% 99%  Weight:      Height:        No intake or output data in the 24 hours ending 10/04/20 0449 Last 3 Weights 10/04/2020 06/22/2020 01/07/2020  Weight (lbs) 195 lb 195 lb 195 lb  Weight (kg) 88.451 kg 88.451 kg 88.451 kg     Body mass index is 32.45 kg/m.  General:  Well nourished, well developed, in no acute distress HEENT: normal Lymph: no adenopathy Neck: no JVD Endocrine:  No thryomegaly Vascular: No carotid bruits; FA pulses 2+ bilaterally without bruits  Cardiac:  normal S1, S2; RRR; no murmur  Lungs:  clear to auscultation bilaterally, no wheezing, rhonchi or rales  Abd: soft, nontender, no hepatomegaly  Ext: no  edema Musculoskeletal:  No deformities, BUE and BLE strength  normal and equal Skin: warm and dry  Neuro:  CNs 2-12 intact, no focal abnormalities noted Psych:  Normal affect    EKG:  The ECG that was done  was personally reviewed and demonstrates sinus tachycardia  Relevant CV Studies: CTPE= shows no PE No echo in system  Laboratory Data:  High Sensitivity Troponin:   Recent Labs  Lab 10/03/20 2140 10/03/20 2336  TROPONINIHS 40* 177*      Chemistry Recent Labs  Lab 10/03/20 2140  NA 138  K 3.5  CL 108  CO2 23  GLUCOSE 128*  BUN 15  CREATININE 1.55*  CALCIUM 8.9  GFRNONAA 39*  ANIONGAP 7    No results for input(s): PROT, ALBUMIN, AST, ALT, ALKPHOS, BILITOT in the last 168 hours. Hematology Recent Labs  Lab 10/03/20 2140  WBC 5.9  RBC 4.61  HGB 10.8*  HCT 35.6*  MCV 77.2*  MCH 23.4*  MCHC 30.3  RDW 14.6  PLT 194   BNPNo results for input(s): BNP, PROBNP in the last 168 hours.  DDimer  Recent Labs  Lab 10/04/20 0014  DDIMER 2.90*     Radiology/Studies:  DG Chest 2 View  Result Date: 10/03/2020 CLINICAL DATA:  Shortness of breath EXAM: CHEST - 2 VIEW COMPARISON:  August 08, 2019 FINDINGS: Lungs are clear. Heart size and pulmonary vascularity are normal. No adenopathy. No pneumothorax. No bone lesions. IMPRESSION: Lungs clear.  Cardiac silhouette normal.  Electronically Signed   By: Lowella Grip III M.D.   On: 10/03/2020 21:58   CT ANGIO CHEST PE W OR WO CONTRAST  Result Date: 10/04/2020 CLINICAL DATA:  Pain and shortness of breath concern for pulmonary embolus EXAM: CT ANGIOGRAPHY CHEST WITH CONTRAST TECHNIQUE: Multidetector CT imaging of the chest was performed using the standard protocol during bolus administration of intravenous contrast. Multiplanar CT image reconstructions and MIPs were obtained to evaluate the vascular anatomy. CONTRAST:  30mL OMNIPAQUE IOHEXOL 350 MG/ML SOLN COMPARISON:  Chest radiograph Oct 08, 2019 FINDINGS: Cardiovascular: Satisfactory opacification of the pulmonary arteries to the segmental level. No evidence of pulmonary embolism. Normal heart size. No pericardial effusion. Mediastinum/Nodes: No enlarged mediastinal, hilar, or axillary lymph nodes. Thyroid gland, trachea, and esophagus demonstrate no significant findings. Lungs/Pleura: Lungs are clear. No pleural effusion or pneumothorax. Upper Abdomen: No acute abnormality. Musculoskeletal: No chest wall abnormality. No acute or significant osseous findings. Review of the MIP images confirms the above findings. IMPRESSION: Negative examination for pulmonary embolism or other acute intrathoracic process. Electronically Signed   By: Dahlia Bailiff MD   On: 10/04/2020 02:28     Assessment and Plan:   # Possible ACS/NSTEMI # Atypical chest pain and SOB # HTN # HLD # Hyperglycemia # CKD Stage 3  Atypical presentation but elevated troponin in setting of SOB and CP which has resolved Will give heparin Aspirin 81 mg from tomorrow Atorvastatin 40 mg Low dose beta blocker as tolerated (metoprolol 12.5 mg BID) Get Echo Stress testing on Monday for low risk NSTEMI No need for plavix for now-will hold off on that Will get LDL, A1c and BNP Continue to trend EKG and troponins   Risk Assessment/Risk Scores:   For questions or updates, please contact [facility] Please consult [email or web address] for contact info under     Signed, Jaci Lazier, MD  10/04/2020 4:49 AM

## 2020-10-04 NOTE — Progress Notes (Signed)
Back from  Nuclear med by bed awake and alert.

## 2020-10-04 NOTE — Progress Notes (Signed)
   Progress Note  Patient Name: Marissa Warner Date of Encounter: 10/04/2020  Primary Cardiologist:  New to [facility]  Admission history and physical reviewed, patient examined, and lab work reviewed.  Ms. Solivan presents describing a sudden episode of a "shaking" feeling in her chest to epigastric area that began suddenly while she was watching television yesterday.  She felt short of breath and lightheaded at the time.  Symptoms lasted for approximately 30 to 45 minutes and then spontaneously resolved.  She states that she has had more brief episodes similar to this over the years, a previous history of syncope that sounds orthostatic.  She does not describe any recurring exertional chest pain or tightness.  No obvious history of cardiac arrhythmia.  On examination this morning she appears comfortable, reports no active symptoms.  Afebrile.  Heart rate in the 70s to 80s in sinus rhythm with I personally reviewed.  Systolic blood pressure 858-850.  Lungs are clear.  Cardiac exam with RRR and 1/6 stock murmur.  No gallop.  Pertinent lab work includes potassium 3.5, BUN 15, creatinine 1.55, high-sensitivity troponin I of 40--177--318,, hemoglobin 10.8, platelets 194, D-dimer 2.9 with no pulmonary embolus by chest CTA, hemoglobin A1c 5.4%, TSH 1.38.  I personally reviewed her ECGs from May 14 in May 15 showing sinus rhythm with increased voltage, left anterior fascicular block, nonspecific ST changes.  Patient was kept n.p.o. today in anticipation of stress testing.  Her symptoms almost sound like a transient arrhythmia, however with increased high-sensitivity troponin I levels, further ischemic and structural cardiac testing is warranted.  Plan to obtain an echocardiogram and Lexiscan Myoview today with further plans to follow.  She has been started on aspirin, Lipitor, and low-dose Lopressor, lisinopril held with CKD stage IIIb and recent CT chest CTA.  Signed, Rozann Lesches, MD  10/04/2020, 7:58 AM

## 2020-10-04 NOTE — Progress Notes (Signed)
1 day lexiscan myoview completed without complication. Pending final report by [facility] reader.

## 2020-10-04 NOTE — Progress Notes (Signed)
Kept NPO for now for possible stress test. Pt. Aware.

## 2020-10-04 NOTE — ED Notes (Signed)
MD notified of critical trop

## 2020-10-04 NOTE — Progress Notes (Signed)
ANTICOAGULATION CONSULT NOTE - Follow-Up Consult  Pharmacy Consult for heparin Indication: chest pain/ACS  No Known Allergies  Patient Measurements: Height: 5\' 5"  (165.1 cm) Weight: 88.5 kg (195 lb 1.7 oz) IBW/kg (Calculated) : 57 Heparin Dosing Weight: 75kg  Vital Signs: Temp: 97.8 F (36.6 C) (05/15 0743) Temp Source: Oral (05/15 0743) BP: 142/101 (05/15 0746) Pulse Rate: 77 (05/15 0746)  Labs: Recent Labs    10/03/20 2140 10/03/20 2336 10/04/20 0508 10/04/20 0735  HGB 10.8*  --   --   --   HCT 35.6*  --   --   --   PLT 194  --   --   --   CREATININE 1.55*  --   --   --   TROPONINIHS 40* 177* 318* 271*    Estimated Creatinine Clearance: 44.5 mL/min (A) (by C-G formula based on SCr of 1.55 mg/dL (H)).   Medical History: Past Medical History:  Diagnosis Date  . Allergy    environmental and seasonal  . Blood transfusion without reported diagnosis before 2004   due to heavy periods  . Hypertension   . Migraines 2007  . Renal disorder    Pt. states "Stage III Kidney disease"  Never had kidney biopsy.  She is unable to tell me what the cause of the kidney disease.  Reportedly diagnosed with [facility] in [place].    Assessment: [phone] female c/o sudden weakness and SOB with mild CP, initial troponin elevated and rising, to begin heparin. Heparin level therapeutic at 0.51 on drip rate 1000 units/hr. CBC stable. No overt bleeding or infusion issues noted.  Goal of Therapy:  Heparin level 0.3-0.7 units/ml Monitor platelets by anticoagulation protocol: Yes   Plan:  Continue heparin 1000 units/hr Monitor daily HL, CBC, s/sx bleeding F/u plans for ischemic eval  Richardine Service, PharmD, BCPS PGY2 Cardiology Pharmacy Resident Phone: [phone] 10/04/2020  11:27 AM  Please check [email or web address] for unit-specific pharmacy phone numbers.

## 2020-10-04 NOTE — Progress Notes (Signed)
Pt arrived on unit- pt is a/o X 4, CHG wipes done. Pt is resting comfortably in bed.

## 2020-10-05 ENCOUNTER — Ambulatory Visit (INDEPENDENT_AMBULATORY_CARE_PROVIDER_SITE_OTHER): Payer: Self-pay

## 2020-10-05 ENCOUNTER — Other Ambulatory Visit: Payer: Self-pay | Admitting: Physician Assistant

## 2020-10-05 DIAGNOSIS — R42 Dizziness and giddiness: Secondary | ICD-10-CM

## 2020-10-05 DIAGNOSIS — I214 Non-ST elevation (NSTEMI) myocardial infarction: Secondary | ICD-10-CM

## 2020-10-05 NOTE — Progress Notes (Unsigned)
Patient enrolled for [employer] to ship a 14 day zio xt long term holter monitor to [address] [place], [place]  [id number].

## 2020-10-13 ENCOUNTER — Ambulatory Visit: Payer: Self-pay | Admitting: Internal Medicine

## 2020-10-15 ENCOUNTER — Encounter: Payer: Self-pay | Admitting: Family

## 2020-10-15 ENCOUNTER — Other Ambulatory Visit: Payer: Self-pay

## 2020-10-15 ENCOUNTER — Ambulatory Visit (INDEPENDENT_AMBULATORY_CARE_PROVIDER_SITE_OTHER): Payer: Self-pay | Admitting: Family

## 2020-10-15 VITALS — BP 134/80 | HR 80 | Resp 18 | Ht 65.0 in | Wt 196.8 lb

## 2020-10-15 DIAGNOSIS — I10 Essential (primary) hypertension: Secondary | ICD-10-CM

## 2020-10-15 DIAGNOSIS — I214 Non-ST elevation (NSTEMI) myocardial infarction: Secondary | ICD-10-CM

## 2020-10-15 DIAGNOSIS — R42 Dizziness and giddiness: Secondary | ICD-10-CM

## 2020-10-15 DIAGNOSIS — I252 Old myocardial infarction: Secondary | ICD-10-CM

## 2020-10-15 DIAGNOSIS — R002 Palpitations: Secondary | ICD-10-CM

## 2020-10-15 NOTE — Progress Notes (Signed)
Office Visit    Patient Name: Marissa Warner Date of Encounter: 10/15/2020  PCP:  Mack Hook, [facility]  Cardiologist:  Donato Heinz, MD  Advanced Practice Provider:  No care team member to display Electrophysiologist:  None   Chief Complaint    Marissa Warner is a 55 y.o. female with a hx of hypertension, anemia, migraine, CKD, chronic neck/shoulder pain presents today for hospital follow up.   Past Medical History    Past Medical History:  Diagnosis Date  . Allergy    environmental and seasonal  . Blood transfusion without reported diagnosis before 2004   due to heavy periods  . Hypertension   . Migraines 2007  . Renal disorder    Pt. states "Stage III Kidney disease"  Never had kidney biopsy.  She is unable to tell me what the cause of the kidney disease.  Reportedly diagnosed with [facility] in [place].   Past Surgical History:  Procedure Laterality Date  . ABDOMINAL HYSTERECTOMY  2004   With BSO, she thinks  . BIOPSY  09/09/2019   Procedure: BIOPSY;  Surgeon: Ronnette Juniper, MD;  [facility]: [facility];  Service: Gastroenterology;;  . CHOLECYSTECTOMY  uncertain date   Laparoscopic  . COLONOSCOPY WITH PROPOFOL N/A 09/09/2019   Procedure: COLONOSCOPY WITH PROPOFOL;  Surgeon: Ronnette Juniper, MD;  [facility];  Service: Gastroenterology;  Laterality: N/A;  . ESOPHAGOGASTRODUODENOSCOPY (EGD) WITH PROPOFOL N/A 09/09/2019   Procedure: ESOPHAGOGASTRODUODENOSCOPY (EGD) WITH PROPOFOL;  Surgeon: Ronnette Juniper, MD;  [facility];  Service: Gastroenterology;  Laterality: N/A;  . LAPAROSCOPIC INCISIONAL / UMBILICAL / VENTRAL HERNIA REPAIR  2000    Allergies  No Known Allergies  History of Present Illness    Marissa Warner is a 55 y.o. female with a hx of  hypertension, anemia, migraine, CKD, chronic neck/shoulder pain last seen while hospitalized.  She was hospitalized 10/03/2020 with elevated troponin in  the setting of acute medical illness (demand ischemia) treated with heparin.  HS troponin 40 ? 177 ? 318 ? 271.  EKG was nonischemic, CTA negative for PE, echocardiogram with LVEF 60 to 65%, mild LVH, no significant valvular disease.  Nuclear stress test with no evidence of ischemia.  14-day ZIO monitor was recommended as her symptoms of "shaking" feeling in her chest were concerning for arrhythmia.  She presents today for follow up.  Continues to report right shoulder pain which is at her baseline.  She was encouraged to reach out to orthopedics for appointment for evaluation as she has seen them previously.  She reports no recurrent palpitations or sensation of "shaking her chest "since being home. Tells me it is unpredictable when it happens. It lasts a couple minutes and self resolves. Reports no shortness of breath at rest and stable dyspnea on exertion. Reports no chest pain, pressure, or tightness. No edema, orthopnea, PND.  ZIO monitor was mailed to her home but she was hesitant to apply herself so she brought it to clinic today.  To be applied for ZIO monitor.  EKGs/Labs/Other Studies Reviewed:   The following studies were reviewed today: Echo 10/04/20: 1. Left ventricular ejection fraction, by estimation, is 60 to 65%. The  left ventricle has normal function. The left ventricle has no regional  wall motion abnormalities. There is mild left ventricular hypertrophy.  Left ventricular diastolic parameters  are indeterminate.   2. Right ventricular systolic function is normal. The right ventricular  size is normal. There is  normal pulmonary artery systolic pressure. The  estimated right ventricular systolic pressure is 96.2 mmHg.   3. The mitral valve is grossly normal. Trivial mitral valve  regurgitation.   4. The aortic valve is tricuspid. Aortic valve regurgitation is not  visualized. Mild to moderate aortic valve sclerosis/calcification is  present, without any evidence of aortic  stenosis. Aortic valve mean  gradient measures 4.0 mmHg.   5. The inferior vena cava is normal in size with greater than 50%  respiratory variability, suggesting right atrial pressure of 3 mmHg.  _____________   Nuclear stress test 10/04/20:  There was no ST segment deviation noted during stress.  The left ventricular ejection fraction is hyperdynamic (>65%).  Nuclear stress EF: 69%.  The study is normal.  This is a low risk study.   1. Normal perfusion 2. Normal systolic function, EF 95% 3. Low risk study   EKG:  No EKG is ordered today.   Recent Labs: 10/03/2020: BUN 15; Creatinine, Ser 1.55; Hemoglobin 10.8; Platelets 194; Potassium 3.5; Sodium 138 10/04/2020: B Natriuretic Peptide 101.6; TSH 1.382  Recent Lipid Panel    Component Value Date/Time   CHOL 187 01/25/2018 0859   TRIG 96 01/25/2018 0859   HDL 78 01/25/2018 0859   LDLCALC 90 01/25/2018 0859   Home Medications   Current Meds  Medication Sig  . acetaminophen (TYLENOL) 500 MG tablet Take 500 mg by mouth every 6 (six) hours as needed for mild pain.  . cetirizine (ZYRTEC) 10 MG tablet Take 10 mg by mouth daily as needed for allergies.  . cyclobenzaprine (FLEXERIL) 10 MG tablet 1/2 to 1 tab by mouth every 8 hours as needed for shoulder and neck pain (Patient taking differently: Take 10 mg by mouth every 8 (eight) hours as needed (shoulder and neck pain).)  . diclofenac Sodium (VOLTAREN) 1 % GEL Apply 4 g topically 4 (four) times daily.  . Ferrous Gluconate 324 (37.5 Fe) MG TABS Take 324 mg by mouth 2 (two) times daily.   Marland Kitchen guaiFENesin (MUCINEX) 600 MG 12 hr tablet Take 600 mg by mouth 2 (two) times daily as needed for cough.  Marland Kitchen lisinopril (ZESTRIL) 10 MG tablet Take 1 tablet (10 mg total) by mouth daily.  Marland Kitchen omeprazole (PRILOSEC) 40 MG capsule Take 1 capsule (40 mg total) by mouth daily.  . pantoprazole (PROTONIX) 40 MG tablet Take 40 mg by mouth daily.  . Polyvinyl Alcohol-Povidone (REFRESH OP) Place 1 drop into  both eyes daily as needed (dry eyes).  . psyllium (METAMUCIL SMOOTH TEXTURE) 58.6 % powder Take 1 packet by mouth daily. (Patient taking differently: Take 1 packet by mouth daily as needed (constipation).)  . SUMAtriptan (IMITREX) 50 MG tablet 1 tab by mouth for migraine headache, may repeat once in 2 hours if not relieved (Patient taking differently: Take 50 mg by mouth every 2 (two) hours as needed for migraine.)  . Vitamin D, Ergocalciferol, (DRISDOL) 1.25 MG (50000 UNIT) CAPS capsule Take 50,000 Units by mouth every Monday.      Review of Systems  All other systems reviewed and are otherwise negative except as noted above.  Physical Exam    VS:  BP 134/80 (BP Location: Left Arm, Patient Position: Sitting, Cuff Size: Normal)   Pulse 80   Resp 18   Ht 5\' 5"  (1.651 m)   Wt 196 lb 12.8 oz (89.3 kg)   SpO2 99%   BMI 32.75 kg/m  , BMI Body mass index is 32.75 kg/m.  Wt  Readings from Last 3 Encounters:  10/15/20 196 lb 12.8 oz (89.3 kg)  10/04/20 195 lb 1.7 oz (88.5 kg)  06/22/20 195 lb (88.5 kg)     GEN: Well nourished, well developed, in no acute distress. HEENT: normal. Neck: Supple, no JVD, carotid bruits, or masses. Cardiac: RRR, no murmurs, rubs, or gallops. No clubbing, cyanosis, edema.  Radials//PT 2+ and equal bilaterally.  Respiratory:  Respirations regular and unlabored, clear to auscultation bilaterally. GI: Soft, nontender, nondistended. MS: No deformity or atrophy. Skin: Warm and dry, no rash. Neuro:  Strength and sensation are intact. Psych: Normal affect.  Assessment & Plan    1. NSTEMI 09/2020 due to demand ischemia -nuclear stress test 09/2020 no evidence of ischemia.  No recurrent anginal symptoms and no indication for further ischemic evaluation at this time. Heart healthy diet and regular cardiovascular exercise encouraged.   2. HTN- BP well controlled. Continue current antihypertensive regimen.   3. CKD- Careful titration of diuretic and antihypertensive.    4. Anemia- Denies bleeding complications.Continue to follow with PCP.  5. Palpitations -no recurrence since recent hospital discharge.  We placed 14-day ZIO XT monitor today.  Encouraged to avoid excessive caffeine, stay well-hydrated. If unrevealing and still concern for arrhythmia referral to EP for consideration of loop recorder could be considered.   Disposition: Follow up in 2 month(s) with Dr. Gardiner Rhyme or APP   Signed, Loel Dubonnet, NP 10/15/2020, 11:11 AM [place]

## 2020-10-15 NOTE — Patient Instructions (Addendum)
Medication Instructions:  Continue your current medications.   *If you need a refill on your cardiac medications before your next appointment, please call your pharmacy*   Lab Work: No lab work ordered today.   Testing/Procedures:  Your provider has placed your Zio monitor today. Please wear until 10/29/2020. Then remove and place in the box we gave you today and place in your normal mail box.   This monitor is a medical device that records the heart's electrical activity. Doctors most often use these monitors to diagnose arrhythmias. Arrhythmias are problems with the speed or rhythm of the heartbeat. The monitor is a small device applied to your chest. You can wear one while you do your normal daily activities. While wearing this monitor if you have any symptoms to push the button and record what you felt. Once you have worn this monitor for the period of time provider prescribed (Usually 14 days), you will return the monitor device in the postage paid box. Once it is returned they will download the data collected and provide [place] with a report which the provider will then review and we will call you with those results. Important tips:  1. Avoid showering during the first 24 hours of wearing the monitor. 2. Avoid excessive sweating to help maximize wear time. 3. Do not submerge the device, no hot tubs, and no swimming pools. 4. Keep any lotions or oils away from the patch. 5. After 24 hours you may shower with the patch on. Take brief showers with your back facing the shower head.  6. Do not remove patch once it has been placed because that will interrupt data and decrease adhesive wear time. 7. Push the button when you have any symptoms and write down what you were feeling. 8. Once you have completed wearing your monitor, remove and place into box which has postage paid and place in your outgoing mailbox.  9. If for some reason you have misplaced your box then call our office and we can provide  another box and/or mail it off for you.   Follow-Up: At [facility], you and your health needs are our priority.  As part of our continuing mission to provide you with exceptional heart care, we have created designated Provider Care Teams.  These Care Teams include your primary Cardiologist (physician) and Advanced Practice Providers (APPs -  Physician Assistants and Nurse Practitioners) who all work together to provide you with the care you need, when you need it.     Your next appointment:   2 month(s)  The format for your next appointment:   In Person  Provider:   You may see Donato Heinz, MD or one of the following Advanced Practice Providers on your designated Care Team:    Rosaria Ferries, PA-C  Jory Sims, DNP, ANP  Other Instructions  Heart Healthy Diet Recommendations: A low-salt diet is recommended. Meats should be grilled, baked, or boiled. Avoid fried foods. Focus on lean protein sources like fish or chicken with vegetables and fruits. The American Heart Association is a Microbiologist!  American Heart Association Diet and Lifeystyle Recommendations   Exercise recommendations: The American Heart Association recommends 150 minutes of moderate intensity exercise weekly. Try 30 minutes of moderate intensity exercise 4-5 times per week. This could include walking, jogging, or swimming.  To prevent palpitations: Make sure you are adequately hydrated.  Avoid and/or limit caffeine containing beverages like soda or tea. Exercise regularly.  Manage stress well. Some over the  counter medications can cause palpitations such as Benadryl, AdvilPM, TylenolPM. Regular Advil or Tylenol do not cause palpitations.

## 2020-10-20 ENCOUNTER — Ambulatory Visit: Payer: Self-pay | Admitting: Internal Medicine

## 2020-11-03 ENCOUNTER — Ambulatory Visit: Payer: Self-pay | Admitting: General Practice

## 2020-11-09 NOTE — Progress Notes (Signed)
See Marissa Warner Duke's recommendation regarding low dose metoprolol if she is still symptomatic

## 2020-11-11 ENCOUNTER — Other Ambulatory Visit: Payer: Self-pay | Admitting: *Deleted

## 2020-11-11 MED ORDER — METOPROLOL TARTRATE 25 MG PO TABS: 12.5000 mg | ORAL_TABLET | Freq: Two times a day (BID) | ORAL | 3 refills | Status: DC

## 2020-12-16 ENCOUNTER — Ambulatory Visit: Payer: Self-pay | Admitting: Internal Medicine

## 2020-12-25 ENCOUNTER — Encounter: Payer: Self-pay | Admitting: Physician Assistant

## 2020-12-25 ENCOUNTER — Ambulatory Visit (INDEPENDENT_AMBULATORY_CARE_PROVIDER_SITE_OTHER): Payer: Medicaid Other | Admitting: Physician Assistant

## 2020-12-25 ENCOUNTER — Other Ambulatory Visit: Payer: Self-pay | Admitting: Internal Medicine

## 2020-12-25 ENCOUNTER — Other Ambulatory Visit: Payer: Self-pay

## 2020-12-25 VITALS — BP 123/82 | HR 82 | Ht 65.0 in | Wt 189.6 lb

## 2020-12-25 DIAGNOSIS — R002 Palpitations: Secondary | ICD-10-CM | POA: Diagnosis not present

## 2020-12-25 DIAGNOSIS — I10 Essential (primary) hypertension: Secondary | ICD-10-CM

## 2020-12-25 DIAGNOSIS — E2839 Other primary ovarian failure: Secondary | ICD-10-CM

## 2020-12-25 NOTE — Progress Notes (Signed)
Cardiology Office Note:    Date:  12/27/2020   ID:  Porfiria Almer, DOB 30-Nov-1965, MRN [id number]  PCP:  Nolene Ebbs, MD   [place] Providers Cardiologist:  Donato Heinz, MD     Referring MD: Mack Hook, MD   Chief Complaint  Patient presents with   Follow-up    Seen for Dr. Gardiner Rhyme    History of Present Illness:    Marissa Warner is a 55 y.o. female with a hx of hypertension, CKD, anemia and migraine.  Patient was recently hospitalized in May 2022 with a shaky feeling in her chest to the epigastric area.  Troponin was elevated up to 300.  D-dimer was 2.9, CTA negative for PE.  TSH normal.  Hemoglobin A1c 5.4.  Echocardiogram showed EF 60 to 65%, mild LVH, no significant valve disease.  Myoview showed no ischemia.  14-day heart monitor was recommended.  On follow-up, she was seen by Laurann Montana, NP on 10/15/2020 at which time she continued to report right shoulder pain which was her baseline.  Her monitor demonstrated occasional PACs and PVCs, predominantly sinus rhythm with episodes of nonsustained atrial tachycardia.  She was placed on 12.5 mg twice a day of metoprolol.  Since starting on the metoprolol, her palpitation has significantly improved.  She denies any significant dizziness, chest discomfort or shortness of breath.  Heart rate is well controlled at 82 bpm.  Patient can follow-up with Dr. Gardiner Rhyme in 5 to 6 months.  Overall she is doing very well.  Past Medical History:  Diagnosis Date   Allergy    environmental and seasonal   Blood transfusion without reported diagnosis before 2004   due to heavy periods   Hypertension    Migraines 2007   Renal disorder    Pt. states "Stage III Kidney disease"  Never had kidney biopsy.  She is unable to tell me what the cause of the kidney disease.  Reportedly diagnosed with [facility] in [place].    Past Surgical History:  Procedure Laterality Date   ABDOMINAL HYSTERECTOMY  2004   With BSO,  she thinks   BIOPSY  09/09/2019   Procedure: BIOPSY;  Surgeon: Ronnette Juniper, MD;  Location: [facility];  Service: Gastroenterology;;   CHOLECYSTECTOMY  uncertain date   Laparoscopic   COLONOSCOPY WITH PROPOFOL N/A 09/09/2019   Procedure: COLONOSCOPY WITH PROPOFOL;  Surgeon: Ronnette Juniper, MD;  Location: [facility];  Service: Gastroenterology;  Laterality: N/A;   ESOPHAGOGASTRODUODENOSCOPY (EGD) WITH PROPOFOL N/A 09/09/2019   Procedure: ESOPHAGOGASTRODUODENOSCOPY (EGD) WITH PROPOFOL;  Surgeon: Ronnette Juniper, MD;  Location: [facility];  Service: Gastroenterology;  Laterality: N/A;   LAPAROSCOPIC INCISIONAL / UMBILICAL / VENTRAL HERNIA REPAIR  2000    Current Medications: Current Meds  Medication Sig   acetaminophen (TYLENOL) 500 MG tablet Take 500 mg by mouth every 6 (six) hours as needed for mild pain.   cetirizine (ZYRTEC) 10 MG tablet Take 10 mg by mouth daily as needed for allergies.   cyclobenzaprine (FLEXERIL) 10 MG tablet 1/2 to 1 tab by mouth every 8 hours as needed for shoulder and neck pain (Patient taking differently: Take 10 mg by mouth every 8 (eight) hours as needed (shoulder and neck pain).)   diclofenac Sodium (VOLTAREN) 1 % GEL Apply 4 g topically 4 (four) times daily.   Ferrous Gluconate 324 (37.5 Fe) MG TABS Take 324 mg by mouth 2 (two) times daily.    guaiFENesin (MUCINEX) 600 MG 12 hr tablet Take 600 mg by  mouth 2 (two) times daily as needed for cough.   lisinopril (ZESTRIL) 10 MG tablet Take 1 tablet (10 mg total) by mouth daily.   metoprolol tartrate (LOPRESSOR) 25 MG tablet Take 0.5 tablets (12.5 mg total) by mouth 2 (two) times daily.   omeprazole (PRILOSEC) 40 MG capsule Take 1 capsule (40 mg total) by mouth daily.   pantoprazole (PROTONIX) 40 MG tablet Take 40 mg by mouth daily.   Polyvinyl Alcohol-Povidone (REFRESH OP) Place 1 drop into both eyes daily as needed (dry eyes).   psyllium (METAMUCIL SMOOTH TEXTURE) 58.6 % powder Take 1 packet by mouth daily. (Patient  taking differently: Take 1 packet by mouth daily as needed (constipation).)   SUMAtriptan (IMITREX) 50 MG tablet 1 tab by mouth for migraine headache, may repeat once in 2 hours if not relieved (Patient taking differently: Take 50 mg by mouth every 2 (two) hours as needed for migraine.)   tiZANidine (ZANAFLEX) 4 MG tablet Take 4 mg by mouth 2 (two) times daily.   Vitamin D, Ergocalciferol, (DRISDOL) 1.25 MG (50000 UNIT) CAPS capsule Take 50,000 Units by mouth every Monday.      Allergies:   Patient has no known allergies.   Social History   Socioeconomic History   Marital status: Single    Spouse name: Not on file   Number of children: 4   Years of education: 12   Highest education level: Not on file  Occupational History   Occupation: unemployed    Comment: Previously in housekeeping  Tobacco Use   Smoking status: Never   Smokeless tobacco: Current    Types: Chew   Tobacco comments:    down to once daily or every other day:  8/19  Substance and Sexual Activity   Alcohol use: No   Drug use: No   Sexual activity: Never    Partners: Male  Other Topics Concern   Not on file  Social History Narrative   Originally from [place], [place] far from [place], ([place])   Moved to [place] in March 2017 to be near daughter.   Lives alone near [place].   Social Determinants of Health   Financial Resource Strain: Not on file  Food Insecurity: Not on file  Transportation Needs: Not on file  Physical Activity: Not on file  Stress: Not on file  Social Connections: Not on file     Family History: The patient's family history is not on file.  ROS:   Please see the history of present illness.     All other systems reviewed and are negative.  EKGs/Labs/Other Studies Reviewed:    The following studies were reviewed today:  Echo 10/04/2020  1. Left ventricular ejection fraction, by estimation, is 60 to 65%. The  left ventricle has normal function. The left ventricle  has no regional  wall motion abnormalities. There is mild left ventricular hypertrophy.  Left ventricular diastolic parameters  are indeterminate.   2. Right ventricular systolic function is normal. The right ventricular  size is normal. There is normal pulmonary artery systolic pressure. The  estimated right ventricular systolic pressure is 0000000 mmHg.   3. The mitral valve is grossly normal. Trivial mitral valve  regurgitation.   4. The aortic valve is tricuspid. Aortic valve regurgitation is not  visualized. Mild to moderate aortic valve sclerosis/calcification is  present, without any evidence of aortic stenosis. Aortic valve mean  gradient measures 4.0 mmHg.   5. The inferior vena cava is normal in size  with greater than 50%  respiratory variability, suggesting right atrial pressure of 3 mmHg.   EKG:  EKG is not ordered today.    Recent Labs: 10/03/2020: BUN 15; Creatinine, Ser 1.55; Hemoglobin 10.8; Platelets 194; Potassium 3.5; Sodium 138 10/04/2020: B Natriuretic Peptide 101.6; TSH 1.382  Recent Lipid Panel    Component Value Date/Time   CHOL 187 01/25/2018 0859   TRIG 96 01/25/2018 0859   HDL 78 01/25/2018 0859   LDLCALC 90 01/25/2018 0859     Risk Assessment/Calculations:           Physical Exam:    VS:  BP 123/82   Pulse 82   Ht '5\' 5"'$  (1.651 m)   Wt 189 lb 9.6 oz (86 kg)   SpO2 96%   BMI 31.55 kg/m     Wt Readings from Last 3 Encounters:  12/25/20 189 lb 9.6 oz (86 kg)  10/15/20 196 lb 12.8 oz (89.3 kg)  10/04/20 195 lb 1.7 oz (88.5 kg)     GEN:  Well nourished, well developed in no acute distress HEENT: Normal NECK: No JVD; No carotid bruits LYMPHATICS: No lymphadenopathy CARDIAC: RRR, no murmurs, rubs, gallops RESPIRATORY:  Clear to auscultation without rales, wheezing or rhonchi  ABDOMEN: Soft, non-tender, non-distended MUSCULOSKELETAL:  No edema; No deformity  SKIN: Warm and dry NEUROLOGIC:  Alert and oriented x 3 PSYCHIATRIC:  Normal affect    ASSESSMENT:    1. Palpitations   2. Essential hypertension    PLAN:    In order of problems listed above:  Palpitation: Symptom has significantly improved after addition of low-dose beta-blocker.  Continue on current therapy  Hypertension: Blood pressure stable on current therapy.        Medication Adjustments/Labs and Tests Ordered: Current medicines are reviewed at length with the patient today.  Concerns regarding medicines are outlined above.  No orders of the defined types were placed in this encounter.  No orders of the defined types were placed in this encounter.   Patient Instructions  Medication Instructions:  Your physician recommends that you continue on your current medications as directed. Please refer to the Current Medication list given to you today.  *If you need a refill on your cardiac medications before your next appointment, please call your pharmacy*  Lab Work: NONE ordered at this time of appointment   If you have labs (blood work) drawn today and your tests are completely normal, you will receive your results only by: [place] (if you have MyChart) OR A paper copy in the mail If you have any lab test that is abnormal or we need to change your treatment, we will call you to review the results.  Testing/Procedures: NONE ordered at this time of appointment   Follow-Up: At [facility], you and your health needs are our priority.  As part of our continuing mission to provide you with exceptional heart care, we have created designated Provider Care Teams.  These Care Teams include your primary Cardiologist (physician) and Advanced Practice Providers (APPs -  Physician Assistants and Nurse Practitioners) who all work together to provide you with the care you need, when you need it.   Your next appointment:   6 month(s)  The format for your next appointment:   In Person  Provider:   Oswaldo Milian, MD  Other Instructions     Signed, Marissa Warner, [place]  12/27/2020 11:26 PM    [place]

## 2020-12-25 NOTE — Patient Instructions (Signed)
Medication Instructions:  Your physician recommends that you continue on your current medications as directed. Please refer to the Current Medication list given to you today.  *If you need a refill on your cardiac medications before your next appointment, please call your pharmacy*  Lab Work: NONE ordered at this time of appointment   If you have labs (blood work) drawn today and your tests are completely normal, you will receive your results only by: [place] (if you have MyChart) OR A paper copy in the mail If you have any lab test that is abnormal or we need to change your treatment, we will call you to review the results.  Testing/Procedures: NONE ordered at this time of appointment   Follow-Up: At [facility], you and your health needs are our priority.  As part of our continuing mission to provide you with exceptional heart care, we have created designated Provider Care Teams.  These Care Teams include your primary Cardiologist (physician) and Advanced Practice Providers (APPs -  Physician Assistants and Nurse Practitioners) who all work together to provide you with the care you need, when you need it.   Your next appointment:   6 month(s)  The format for your next appointment:   In Person  Provider:   Oswaldo Milian, MD  Other Instructions

## 2020-12-27 ENCOUNTER — Encounter: Payer: Self-pay | Admitting: Physician Assistant

## 2020-12-31 ENCOUNTER — Ambulatory Visit
Admission: RE | Admit: 2020-12-31 | Discharge: 2020-12-31 | Disposition: A | Discharge status: Home / Self Care | Payer: Medicaid Other | Source: Ambulatory Visit | Attending: Internal Medicine | Admitting: Internal Medicine

## 2020-12-31 ENCOUNTER — Other Ambulatory Visit: Payer: Self-pay

## 2020-12-31 DIAGNOSIS — E2839 Other primary ovarian failure: Secondary | ICD-10-CM

## 2021-06-27 NOTE — Progress Notes (Signed)
Cardiology Office Note:    Date:  07/02/2021   ID:  Marissa Warner, DOB 11/26/1965, MRN [id number]  PCP:  Nolene Ebbs, MD  Cardiologist:  Donato Heinz, MD  Electrophysiologist:  None   Referring MD: Nolene Ebbs, MD   Chief Complaint  Patient presents with   Palpitations    History of Present Illness:    Marissa Warner is a 56 y.o. female with a hx of hypertension, CKD, anemia, migraines who presents for follow-up.  She was hospitalized in May 2022 with atypical chest pain.  Troponin 300.  D-dimer 2.9, CTPA negative for PE.  Echocardiogram showed EF 60 to 65%, no significant valvular disease.  Myoview showed no ischemia.  Was having palpitations and 14-day Zio patch was ordered which showed occasional PACs/PVCs and atrial tachycardia up to 9 beats.  She was started on low-dose metoprolol.  Since last clinic visit, she reports that she has been doing okay.  Denies any chest pain, dyspnea,  lower extremity edema.  Rare lightheadedness but no syncope.  Palpitations have improved with metoprolol.  She exercises at [facility] twice per week for about 4 hours.  Denies any exertional symptoms.   Past Medical History:  Diagnosis Date   Allergy    environmental and seasonal   Blood transfusion without reported diagnosis before 2004   due to heavy periods   Hypertension    Migraines 2007   Renal disorder    Pt. states "Stage III Kidney disease"  Never had kidney biopsy.  She is unable to tell me what the cause of the kidney disease.  Reportedly diagnosed with [facility] in [place].    Past Surgical History:  Procedure Laterality Date   ABDOMINAL HYSTERECTOMY  2004   With BSO, she thinks   BIOPSY  09/09/2019   Procedure: BIOPSY;  Surgeon: Ronnette Juniper, MD;  Location: [facility];  Service: Gastroenterology;;   CHOLECYSTECTOMY  uncertain date   Laparoscopic   COLONOSCOPY WITH PROPOFOL N/A 09/09/2019   Procedure: COLONOSCOPY WITH PROPOFOL;  Surgeon: Ronnette Juniper, MD;   Location: [facility];  Service: Gastroenterology;  Laterality: N/A;   ESOPHAGOGASTRODUODENOSCOPY (EGD) WITH PROPOFOL N/A 09/09/2019   Procedure: ESOPHAGOGASTRODUODENOSCOPY (EGD) WITH PROPOFOL;  Surgeon: Ronnette Juniper, MD;  [facility]: [facility];  Service: Gastroenterology;  Laterality: N/A;   LAPAROSCOPIC INCISIONAL / UMBILICAL / VENTRAL HERNIA REPAIR  2000    Current Medications: Current Meds  Medication Sig   acetaminophen (TYLENOL) 500 MG tablet Take 500 mg by mouth every 6 (six) hours as needed for mild pain.   cetirizine (ZYRTEC) 10 MG tablet Take 10 mg by mouth daily as needed for allergies.   cyclobenzaprine (FLEXERIL) 10 MG tablet 1/2 to 1 tab by mouth every 8 hours as needed for shoulder and neck pain (Patient taking differently: Take 10 mg by mouth every 8 (eight) hours as needed (shoulder and neck pain).)   diclofenac Sodium (VOLTAREN) 1 % GEL Apply 4 g topically 4 (four) times daily.   Ferrous Gluconate 324 (37.5 Fe) MG TABS Take 324 mg by mouth 2 (two) times daily.    fluticasone (FLONASE) 50 MCG/ACT nasal spray Place into both nostrils daily.   guaiFENesin (MUCINEX) 600 MG 12 hr tablet Take 600 mg by mouth 2 (two) times daily as needed for cough.   lisinopril (ZESTRIL) 10 MG tablet Take 1 tablet (10 mg total) by mouth daily.   metoprolol tartrate (LOPRESSOR) 25 MG tablet Take 0.5 tablets (12.5 mg total) by mouth 2 (two) times daily.  omeprazole (PRILOSEC) 40 MG capsule Take 1 capsule (40 mg total) by mouth daily.   pantoprazole (PROTONIX) 40 MG tablet Take 40 mg by mouth daily.   Polyvinyl Alcohol-Povidone (REFRESH OP) Place 1 drop into both eyes daily as needed (dry eyes).   psyllium (METAMUCIL SMOOTH TEXTURE) 58.6 % powder Take 1 packet by mouth daily. (Patient taking differently: Take 1 packet by mouth daily as needed (constipation).)   SUMAtriptan (IMITREX) 50 MG tablet 1 tab by mouth for migraine headache, may repeat once in 2 hours if not relieved (Patient taking  differently: Take 50 mg by mouth every 2 (two) hours as needed for migraine.)   tiZANidine (ZANAFLEX) 4 MG tablet Take 4 mg by mouth 2 (two) times daily.   topiramate (TOPAMAX) 25 MG capsule Take 25 mg by mouth daily.   Vitamin D, Ergocalciferol, (DRISDOL) 1.25 MG (50000 UNIT) CAPS capsule Take 50,000 Units by mouth every Monday.      Allergies:   Patient has no known allergies.   Social History   Socioeconomic History   Marital status: Single    Spouse name: Not on file   Number of children: 4   Years of education: 12   Highest education level: Not on file  Occupational History   Occupation: unemployed    Comment: Previously in housekeeping  Tobacco Use   Smoking status: Never   Smokeless tobacco: Current    Types: Chew   Tobacco comments:    down to once daily or every other day:  8/19  Substance and Sexual Activity   Alcohol use: No   Drug use: No   Sexual activity: Never    Partners: Male  Other Topics Concern   Not on file  Social History Narrative   Originally from [place], not far from [place], ([place])   Moved to [place] in March 2017 to be near daughter.   Lives alone near [place].   Social Determinants of Health   Financial Resource Strain: Not on file  Food Insecurity: Not on file  Transportation Needs: Not on file  Physical Activity: Not on file  Stress: Not on file  Social Connections: Not on file     Family History: The patient's family history is not on file.  ROS:   Please see the history of present illness.     All other systems reviewed and are negative.  EKGs/Labs/Other Studies Reviewed:    The following studies were reviewed today:   EKG:   07/02/21: NSR, LAD, rate 75  Recent Labs: 10/03/2020: BUN 15; Creatinine, Ser 1.55; Hemoglobin 10.8; Platelets 194; Potassium 3.5; Sodium 138 10/04/2020: B Natriuretic Peptide 101.6; TSH 1.382  Recent Lipid Panel    Component Value Date/Time   CHOL 187 01/25/2018 0859   TRIG 96  01/25/2018 0859   HDL 78 01/25/2018 0859   LDLCALC 90 01/25/2018 0859    Physical Exam:    VS:  BP (!) 142/100    Pulse 75    Ht 5\' 5"  (1.651 m)    Wt 193 lb 6.4 oz (87.7 kg)    SpO2 100%    BMI 32.18 kg/m     Wt Readings from Last 3 Encounters:  07/02/21 193 lb 6.4 oz (87.7 kg)  12/25/20 189 lb 9.6 oz (86 kg)  10/15/20 196 lb 12.8 oz (89.3 kg)     GEN:  Well nourished, well developed in no acute distress HEENT: Normal NECK: No JVD; No carotid bruits LYMPHATICS: No lymphadenopathy CARDIAC: RRR,  no murmurs, rubs, gallops RESPIRATORY:  Clear to auscultation without rales, wheezing or rhonchi  ABDOMEN: Soft, non-tender, non-distended MUSCULOSKELETAL:  No edema; No deformity  SKIN: Warm and dry NEUROLOGIC:  Alert and oriented x 3 PSYCHIATRIC:  Normal affect   ASSESSMENT:    1. Palpitations   2. Chest pain of uncertain etiology   3. Essential hypertension   4. Stage 3b chronic kidney disease (CKD) (HCC)    PLAN:    Palpitations:  14-day Zio patch was ordered which showed occasional PACs/PVCs and atrial tachycardia up to 9 beats.  She was started on low-dose metoprolol, with improvement in her symptoms.  Will switch from metoprolol to carvedilol given elevated BP.  Atypical chest pain: Admission in May 2022.  with atypical chest pain.  Troponin 300.  D-dimer 2.9, CTPA negative for PE.  Echocardiogram showed EF 60 to 65%, no significant valvular disease.  Myoview showed no ischemia. -Reports no recent chest pain  Hypertension: On lisinopril 10 mg daily, metoprolol 12.5 mg twice daily.   -BP elevated, will switch from metoprolol to carvedilol 6.25 mg twice daily.  Will provide patient with BP monitor and asked to check BP daily for next 2 weeks, will schedule in pharmacy hypertension clinic in 2 weeks  CKD stage IIIb: Most recent creatinine 1.55 09/2020  RTC in 6 months    Medication Adjustments/Labs and Tests Ordered: Current medicines are reviewed at length with the  patient today.  Concerns regarding medicines are outlined above.  No orders of the defined types were placed in this encounter.  No orders of the defined types were placed in this encounter.   There are no Patient Instructions on file for this visit.   Signed, Donato Heinz, MD  07/02/2021 10:03 AM    [place]

## 2021-07-02 ENCOUNTER — Encounter: Payer: Self-pay | Admitting: Cardiology

## 2021-07-02 ENCOUNTER — Other Ambulatory Visit: Payer: Self-pay

## 2021-07-02 ENCOUNTER — Ambulatory Visit (INDEPENDENT_AMBULATORY_CARE_PROVIDER_SITE_OTHER): Payer: Medicaid Other | Admitting: Cardiology

## 2021-07-02 VITALS — BP 142/100 | HR 75 | Ht 65.0 in | Wt 193.4 lb

## 2021-07-02 DIAGNOSIS — I10 Essential (primary) hypertension: Secondary | ICD-10-CM | POA: Diagnosis not present

## 2021-07-02 DIAGNOSIS — N1832 Chronic kidney disease, stage 3b: Secondary | ICD-10-CM

## 2021-07-02 DIAGNOSIS — R002 Palpitations: Secondary | ICD-10-CM

## 2021-07-02 DIAGNOSIS — R079 Chest pain, unspecified: Secondary | ICD-10-CM | POA: Diagnosis not present

## 2021-07-02 MED ORDER — CARVEDILOL 6.25 MG PO TABS: 6.2500 mg | ORAL_TABLET | Freq: Two times a day (BID) | ORAL | 3 refills | Status: AC

## 2021-07-02 NOTE — Patient Instructions (Signed)
Medication Instructions:  Stop Metoprolol   Start Carvedilol 12.5 mg twice a day   *If you need a refill on your cardiac medications before your next appointment, please call your pharmacy*   Lab Work: None ordered   Testing/Procedures: None ordered   Follow-Up: At [facility], you and your health needs are our priority.  As part of our continuing mission to provide you with exceptional heart care, we have created designated Provider Care Teams.  These Care Teams include your primary Cardiologist (physician) and Advanced Practice Providers (APPs -  Physician Assistants and Nurse Practitioners) who all work together to provide you with the care you need, when you need it.  We recommend signing up for the patient portal called "MyChart".  Sign up information is provided on this After Visit Summary.  MyChart is used to connect with patients for Virtual Visits (Telemedicine).  Patients are able to view lab/test results, encounter notes, upcoming appointments, etc.  Non-urgent messages can be sent to your provider as well.   To learn more about what you can do with MyChart, go to [email or web address].      Schedule appointment with Pharmacist in Hypertension Clinic  2 weeks   Your next appointment:  6 months    The format for your next appointment:  Office   Provider:  Dr.Schumann  Check blood pressure daily Keep a log and bring to next appointment

## 2021-07-28 ENCOUNTER — Ambulatory Visit: Payer: Medicaid Other

## 2021-07-28 DIAGNOSIS — D509 Iron deficiency anemia, unspecified: Secondary | ICD-10-CM | POA: Insufficient documentation

## 2021-07-28 NOTE — Progress Notes (Deleted)
Patient ID: Marissa Warner                 DOB: 06-Jul-1965                      MRN: [id number] ? ? ? ? ?HPI: ?Marissa Warner is a 56 y.o. female referred by Dr. Gardiner Warner to HTN clinic. PMH is significant for palpitations, chest pain, HTN, and CKD ? ?Current HTN meds:  ?Carvedilol 6.'25mg'$  BID ?Lisinopril '10mg'$  daily ? ?Previously tried:  ?BP goal: <130/80 ? ?Family History:  ? ?Social History:  ? ?Diet:  ? ?Exercise:  ? ?Home BP readings:  ? ?Wt Readings from Last 3 Encounters:  ?07/02/21 193 lb 6.4 oz (87.7 kg)  ?12/25/20 189 lb 9.6 oz (86 kg)  ?10/15/20 196 lb 12.8 oz (89.3 kg)  ? ?BP Readings from Last 3 Encounters:  ?07/02/21 (!) 142/100  ?12/25/20 123/82  ?10/15/20 134/80  ? ?Pulse Readings from Last 3 Encounters:  ?07/02/21 75  ?12/25/20 82  ?10/15/20 80  ? ? ?Renal function: ?CrCl cannot be calculated (Patient's most recent lab result is older than the maximum 21 days allowed.). ? ?Past Medical History:  ?Diagnosis Date  ? Allergy   ? environmental and seasonal  ? Blood transfusion without reported diagnosis before 2004  ? due to heavy periods  ? Hypertension   ? Migraines 2007  ? Renal disorder   ? Pt. states "Stage III Kidney disease"  Never had kidney biopsy.  She is unable to tell me what the cause of the kidney disease.  Reportedly diagnosed with [facility] in [place].  ? ? ?Current Outpatient Medications on File Prior to Visit  ?Medication Sig Dispense Refill  ? acetaminophen (TYLENOL) 500 MG tablet Take 500 mg by mouth every 6 (six) hours as needed for mild pain.    ? carvedilol (COREG) 6.25 MG tablet Take 1 tablet (6.25 mg total) by mouth 2 (two) times daily. 180 tablet 3  ? cetirizine (ZYRTEC) 10 MG tablet Take 10 mg by mouth daily as needed for allergies.    ? cyclobenzaprine (FLEXERIL) 10 MG tablet 1/2 to 1 tab by mouth every 8 hours as needed for shoulder and neck pain (Patient taking differently: Take 10 mg by mouth every 8 (eight) hours as needed (shoulder and neck pain).) 30 tablet 0  ?  diclofenac Sodium (VOLTAREN) 1 % GEL Apply 4 g topically 4 (four) times daily. 100 g 11  ? Ferrous Gluconate 324 (37.5 Fe) MG TABS Take 324 mg by mouth 2 (two) times daily.     ? fluticasone (FLONASE) 50 MCG/ACT nasal spray Place into both nostrils daily.    ? guaiFENesin (MUCINEX) 600 MG 12 hr tablet Take 600 mg by mouth 2 (two) times daily as needed for cough.    ? lisinopril (ZESTRIL) 10 MG tablet Take 1 tablet (10 mg total) by mouth daily. 30 tablet 11  ? omeprazole (PRILOSEC) 40 MG capsule Take 1 capsule (40 mg total) by mouth daily. 30 capsule 6  ? pantoprazole (PROTONIX) 40 MG tablet Take 40 mg by mouth daily.    ? Polyvinyl Alcohol-Povidone (REFRESH OP) Place 1 drop into both eyes daily as needed (dry eyes).    ? psyllium (METAMUCIL SMOOTH TEXTURE) 58.6 % powder Take 1 packet by mouth daily. (Patient taking differently: Take 1 packet by mouth daily as needed (constipation).) 283 g 12  ? SUMAtriptan (IMITREX) 50 MG tablet 1 tab by mouth for migraine headache, may  repeat once in 2 hours if not relieved (Patient taking differently: Take 50 mg by mouth every 2 (two) hours as needed for migraine.) 9 tablet 11  ? tiZANidine (ZANAFLEX) 4 MG tablet Take 4 mg by mouth 2 (two) times daily.    ? topiramate (TOPAMAX) 25 MG capsule Take 25 mg by mouth daily.    ? Vitamin D, Ergocalciferol, (DRISDOL) 1.25 MG (50000 UNIT) CAPS capsule Take 50,000 Units by mouth every Monday.     ? ?No current facility-administered medications on file prior to visit.  ? ? ?No Known Allergies ? ? ?Assessment/Plan: ? ?1. Hypertension -   ?

## 2021-11-01 ENCOUNTER — Other Ambulatory Visit: Payer: Self-pay | Admitting: Internal Medicine

## 2021-11-01 DIAGNOSIS — Z1231 Encounter for screening mammogram for malignant neoplasm of breast: Secondary | ICD-10-CM

## 2021-11-10 ENCOUNTER — Ambulatory Visit
Admission: RE | Admit: 2021-11-10 | Discharge: 2021-11-10 | Disposition: A | Discharge status: Home / Self Care | Payer: Medicaid Other | Source: Ambulatory Visit | Attending: Internal Medicine | Admitting: Internal Medicine

## 2021-11-10 DIAGNOSIS — Z1231 Encounter for screening mammogram for malignant neoplasm of breast: Secondary | ICD-10-CM

## 2021-11-12 ENCOUNTER — Other Ambulatory Visit: Payer: Self-pay | Admitting: Internal Medicine

## 2021-11-12 DIAGNOSIS — R928 Other abnormal and inconclusive findings on diagnostic imaging of breast: Secondary | ICD-10-CM

## 2021-12-03 ENCOUNTER — Ambulatory Visit: Payer: Medicaid Other

## 2021-12-03 ENCOUNTER — Ambulatory Visit
Admission: RE | Admit: 2021-12-03 | Discharge: 2021-12-03 | Disposition: A | Discharge status: Home / Self Care | Payer: Medicaid Other | Source: Ambulatory Visit | Attending: Internal Medicine | Admitting: Internal Medicine

## 2021-12-03 DIAGNOSIS — R928 Other abnormal and inconclusive findings on diagnostic imaging of breast: Secondary | ICD-10-CM

## 2022-01-25 ENCOUNTER — Encounter (HOSPITAL_COMMUNITY): Payer: Self-pay | Admitting: Emergency Medicine

## 2022-01-25 ENCOUNTER — Emergency Department (HOSPITAL_COMMUNITY)
Admission: EM | Admit: 2022-01-25 | Discharge: 2022-01-26 | Disposition: A | Discharge status: Home / Self Care | Payer: Medicaid Other | Attending: Emergency Medicine | Admitting: Emergency Medicine

## 2022-01-25 ENCOUNTER — Other Ambulatory Visit: Payer: Self-pay

## 2022-01-25 DIAGNOSIS — R1013 Epigastric pain: Secondary | ICD-10-CM | POA: Diagnosis not present

## 2022-01-25 DIAGNOSIS — R7401 Elevation of levels of liver transaminase levels: Secondary | ICD-10-CM | POA: Diagnosis not present

## 2022-01-25 DIAGNOSIS — R748 Abnormal levels of other serum enzymes: Secondary | ICD-10-CM

## 2022-01-25 DIAGNOSIS — R112 Nausea with vomiting, unspecified: Secondary | ICD-10-CM | POA: Insufficient documentation

## 2022-01-25 DIAGNOSIS — N189 Chronic kidney disease, unspecified: Secondary | ICD-10-CM | POA: Diagnosis not present

## 2022-01-25 DIAGNOSIS — Z79899 Other long term (current) drug therapy: Secondary | ICD-10-CM | POA: Insufficient documentation

## 2022-01-25 LAB — COMPREHENSIVE METABOLIC PANEL
ALT: 313 U/L — ABNORMAL HIGH (ref 0–44)
AST: 1000 U/L — ABNORMAL HIGH (ref 15–41)
Albumin: 2.6 g/dL — ABNORMAL LOW (ref 3.5–5.0)
Alkaline Phosphatase: 142 U/L — ABNORMAL HIGH (ref 38–126)
Anion gap: 6 (ref 5–15)
BUN: 16 mg/dL (ref 6–20)
CO2: 21 mmol/L — ABNORMAL LOW (ref 22–32)
Calcium: 9.1 mg/dL (ref 8.9–10.3)
Chloride: 109 mmol/L (ref 98–111)
Creatinine, Ser: 1.72 mg/dL — ABNORMAL HIGH (ref 0.44–1.00)
GFR, Estimated: 34 mL/min — ABNORMAL LOW (ref >60)
Glucose, Bld: 114 mg/dL — ABNORMAL HIGH (ref 70–99)
Potassium: 4.1 mmol/L (ref 3.5–5.1)
Sodium: 136 mmol/L (ref 135–145)
Total Bilirubin: 3.6 mg/dL — ABNORMAL HIGH (ref 0.3–1.2)
Total Protein: 7.9 g/dL (ref 6.5–8.1)

## 2022-01-25 LAB — RAPID URINE DRUG SCREEN, HOSP PERFORMED
Amphetamines: NOT DETECTED
Barbiturates: NOT DETECTED
Benzodiazepines: NOT DETECTED
Cocaine: NOT DETECTED
Opiates: NOT DETECTED
Tetrahydrocannabinol: NOT DETECTED

## 2022-01-25 LAB — CBC WITH DIFFERENTIAL/PLATELET
Abs Immature Granulocytes: 0.02 10*3/uL (ref 0.00–0.07)
Basophils Absolute: 0.1 10*3/uL (ref 0.0–0.1)
Basophils Relative: 1 %
Eosinophils Absolute: 0.1 10*3/uL (ref 0.0–0.5)
Eosinophils Relative: 2 %
HCT: 28 % — ABNORMAL LOW (ref 36.0–46.0)
Hemoglobin: 9.5 g/dL — ABNORMAL LOW (ref 12.0–15.0)
Immature Granulocytes: 0 %
Lymphocytes Relative: 35 %
Lymphs Abs: 1.8 10*3/uL (ref 0.7–4.0)
MCH: 24.4 pg — ABNORMAL LOW (ref 26.0–34.0)
MCHC: 33.9 g/dL (ref 30.0–36.0)
MCV: 72 fL — ABNORMAL LOW (ref 80.0–100.0)
Monocytes Absolute: 0.6 10*3/uL (ref 0.1–1.0)
Monocytes Relative: 11 %
Neutro Abs: 2.6 10*3/uL (ref 1.7–7.7)
Neutrophils Relative %: 51 %
Platelets: 136 10*3/uL — ABNORMAL LOW (ref 150–400)
RBC: 3.89 MIL/uL (ref 3.87–5.11)
RDW: 19.8 % — ABNORMAL HIGH (ref 11.5–15.5)
WBC: 5 10*3/uL (ref 4.0–10.5)
nRBC: 0 % (ref 0.0–0.2)

## 2022-01-25 LAB — URINALYSIS, ROUTINE W REFLEX MICROSCOPIC
Bacteria, UA: NONE SEEN
Bilirubin Urine: NEGATIVE
Glucose, UA: NEGATIVE mg/dL
Hgb urine dipstick: NEGATIVE
Ketones, ur: NEGATIVE mg/dL
Nitrite: NEGATIVE
Protein, ur: 30 mg/dL — AB
Specific Gravity, Urine: 1.02 (ref 1.005–1.030)
pH: 5 (ref 5.0–8.0)

## 2022-01-25 LAB — CBG MONITORING, ED: Glucose-Capillary: 110 mg/dL — ABNORMAL HIGH (ref 70–99)

## 2022-01-25 LAB — LIPASE, BLOOD: Lipase: 72 U/L — ABNORMAL HIGH (ref 11–51)

## 2022-01-25 MED ORDER — ONDANSETRON 4 MG PO TBDP
8.0000 mg | ORAL_TABLET | Freq: Once | ORAL | Status: AC
  Administered 2022-01-25: 8 mg via ORAL
  Filled 2022-01-25: qty 2

## 2022-01-25 NOTE — ED Provider Triage Note (Signed)
Emergency Medicine Provider Triage Evaluation Note  Labrina Lines , a 56 y.o. female  was evaluated in triage.  Pt complains nausea and vomiting over the last couple weeks, she is having no stomach pains, she denies hematemesis or coffee-ground emesis she states her last bowel movement was yesterday denies melena or hematochezia, no urinary symptoms, no systemic infection no cough congestion, she states that she has been unable to tolerate p.o., she is not having other complaints..  Review of Systems  Positive: Nausea vomiting Negative: Abdominal pain fever chills  Physical Exam  BP (!) 130/55   Pulse 86   Temp 99.2 F (37.3 C) (Oral)   Resp 16   SpO2 99%  Gen:   Awake, no distress   Resp:  Normal effort  MSK:   Moves extremities without difficulty  Other:  Patient has a nonsurgical abdomen.  Medical Decision Making  Medically screening exam initiated at 10:38 PM.  Appropriate orders placed.  Verleen Stuckey was informed that the remainder of the evaluation will be completed by another provider, this initial triage assessment does not replace that evaluation, and the importance of remaining in the ED until their evaluation is complete.  Lab work has been ordered will need further work-up.   Marcello Fennel, PA-C 01/25/22 2239

## 2022-01-25 NOTE — ED Triage Notes (Signed)
Patient reports persistent emesis for several weeks unrelieved by prescription medications , no diarrhea or fever .

## 2022-01-26 ENCOUNTER — Emergency Department (HOSPITAL_COMMUNITY): Payer: Medicaid Other

## 2022-01-26 LAB — HEPATITIS PANEL, ACUTE
HCV Ab: NONREACTIVE
Hep A IgM: NONREACTIVE
Hep B C IgM: REACTIVE — AB
Hepatitis B Surface Ag: REACTIVE — AB

## 2022-01-26 LAB — PROTIME-INR
INR: 1.5 — ABNORMAL HIGH (ref 0.8–1.2)
Prothrombin Time: 18 seconds — ABNORMAL HIGH (ref 11.4–15.2)

## 2022-01-26 LAB — ACETAMINOPHEN LEVEL: Acetaminophen (Tylenol), Serum: <10 ug/mL — ABNORMAL LOW (ref 10–30)

## 2022-01-26 MED ORDER — IOHEXOL 300 MG/ML  SOLN
100.0000 mL | Freq: Once | INTRAMUSCULAR | Status: AC | PRN
  Administered 2022-01-26: 80 mL via INTRAVENOUS

## 2022-01-26 MED ORDER — LACTATED RINGERS IV BOLUS
1000.0000 mL | Freq: Once | INTRAVENOUS | Status: AC
  Administered 2022-01-26: 1000 mL via INTRAVENOUS

## 2022-01-26 MED ORDER — PROCHLORPERAZINE 25 MG RE SUPP: 25.0000 mg | Freq: Two times a day (BID) | RECTAL | 0 refills | Status: DC | PRN

## 2022-01-26 MED ORDER — PROCHLORPERAZINE MALEATE 10 MG PO TABS: 10.0000 mg | ORAL_TABLET | Freq: Two times a day (BID) | ORAL | 0 refills | Status: DC | PRN

## 2022-01-26 MED ORDER — PROCHLORPERAZINE EDISYLATE 10 MG/2ML IJ SOLN
10.0000 mg | Freq: Once | INTRAMUSCULAR | Status: AC
  Administered 2022-01-26: 10 mg via INTRAVENOUS
  Filled 2022-01-26: qty 2

## 2022-01-26 NOTE — ED Provider Notes (Signed)
[facility] EMERGENCY DEPARTMENT Provider Note   CSN: [id number] Arrival date & time: 01/25/22  2144     History  Chief Complaint  Patient presents with   Emesis    Marissa Warner is a 56 y.o. female.  56 year old female with history of chronic kidney disease, anemia and cholecystectomy in the distant past that presents the ER today with emesis.  Patient states is been going on for a couple weeks.  She states that she has not really been able to keep much down.  Has some fullness in her epigastric region.  She tried a medicine that starts with a P that her doctor prescribed her which does not seem to help over the last couple days.  No fevers.  No urinary symptoms.  Her last bowel movement was on Monday and was normal.  No constipation but she does have a history of what sounds like an obstruction secondary to an hernia but she describes it as her bowels going inside of a hole so it could also be a intussusception theoretically.  Had surgery for that in the past as well.  Denies any alcohol, drugs, cigarettes or history of hepatitis.   Emesis      Home Medications Prior to Admission medications   Medication Sig Start Date End Date Taking? Authorizing Provider  prochlorperazine (COMPAZINE) 10 MG tablet Take 1 tablet (10 mg total) by mouth 2 (two) times daily as needed for nausea or vomiting. 01/26/22  Yes Cullin Dishman, Corene Cornea, MD  prochlorperazine (COMPAZINE) 25 MG suppository Place 1 suppository (25 mg total) rectally every 12 (twelve) hours as needed for nausea or vomiting. 01/26/22  Yes Nashonda Limberg, Corene Cornea, MD  acetaminophen (TYLENOL) 500 MG tablet Take 500 mg by mouth every 6 (six) hours as needed for mild pain.    [provider]  carvedilol (COREG) 6.25 MG tablet Take 1 tablet (6.25 mg total) by mouth 2 (two) times daily. 07/02/21   Donato Heinz, MD  cetirizine (ZYRTEC) 10 MG tablet Take 10 mg by mouth daily as needed for allergies.    [provider]   cyclobenzaprine (FLEXERIL) 10 MG tablet 1/2 to 1 tab by mouth every 8 hours as needed for shoulder and neck pain Patient taking differently: Take 10 mg by mouth every 8 (eight) hours as needed (shoulder and neck pain). 01/19/18   Mack Hook, MD  diclofenac Sodium (VOLTAREN) 1 % GEL Apply 4 g topically 4 (four) times daily. 06/03/19   Mack Hook, MD  Ferrous Gluconate 324 (37.5 Fe) MG TABS Take 324 mg by mouth 2 (two) times daily.     [provider]  fluticasone (FLONASE) 50 MCG/ACT nasal spray Place into both nostrils daily.    [provider]  guaiFENesin (MUCINEX) 600 MG 12 hr tablet Take 600 mg by mouth 2 (two) times daily as needed for cough.    [provider]  lisinopril (ZESTRIL) 10 MG tablet Take 1 tablet (10 mg total) by mouth daily. 12/16/19   Mack Hook, MD  omeprazole (PRILOSEC) 40 MG capsule Take 1 capsule (40 mg total) by mouth daily. 03/05/19   Mack Hook, MD  pantoprazole (PROTONIX) 40 MG tablet Take 40 mg by mouth daily. 09/12/19   [provider]  Polyvinyl Alcohol-Povidone (REFRESH OP) Place 1 drop into both eyes daily as needed (dry eyes).    [provider]  psyllium (METAMUCIL SMOOTH TEXTURE) 58.6 % powder Take 1 packet by mouth daily. Patient taking differently: Take 1 packet by  mouth daily as needed (constipation). 03/05/19   Mack Hook, MD  SUMAtriptan (IMITREX) 50 MG tablet 1 tab by mouth for migraine headache, may repeat once in 2 hours if not relieved Patient taking differently: Take 50 mg by mouth every 2 (two) hours as needed for migraine. 01/19/18   Mack Hook, MD  tiZANidine (ZANAFLEX) 4 MG tablet Take 4 mg by mouth 2 (two) times daily. 12/22/20   [provider]  topiramate (TOPAMAX) 25 MG capsule Take 25 mg by mouth daily.    [provider]  Vitamin D, Ergocalciferol, (DRISDOL) 1.25 MG (50000 UNIT) CAPS capsule Take 50,000 Units by mouth every Monday.      [provider]      Allergies    Patient has no known allergies.    Review of Systems   Review of Systems  Gastrointestinal:  Positive for vomiting.    Physical Exam Updated Vital Signs BP (!) 101/59 (BP Location: Right Arm)   Pulse 76   Temp 98.6 F (37 C) (Oral)   Resp 17   SpO2 100%  Physical Exam Vitals and nursing note reviewed.  Constitutional:      Appearance: She is well-developed.  HENT:     Head: Normocephalic and atraumatic.     Mouth/Throat:     Mouth: Mucous membranes are moist.     Pharynx: Oropharynx is clear.  Eyes:     General: Scleral icterus present.  Cardiovascular:     Rate and Rhythm: Normal rate and regular rhythm.  Pulmonary:     Effort: No respiratory distress.     Breath sounds: No stridor.  Abdominal:     General: There is no distension.     Tenderness: There is no abdominal tenderness. There is no rebound.     Hernia: No hernia is present.  Musculoskeletal:        General: No swelling or tenderness. Normal range of motion.     Cervical back: Normal range of motion.  Skin:    General: Skin is warm and dry.  Neurological:     General: No focal deficit present.     Mental Status: She is alert.     ED Results / Procedures / Treatments   Labs (all labs ordered are listed, but only abnormal results are displayed) Labs Reviewed  COMPREHENSIVE METABOLIC PANEL - Abnormal; Notable for the following components:      Result Value   CO2 21 (*)    Glucose, Bld 114 (*)    Creatinine, Ser 1.72 (*)    Albumin 2.6 (*)    AST 1,000 (*)    ALT 313 (*)    Alkaline Phosphatase 142 (*)    Total Bilirubin 3.6 (*)    GFR, Estimated 34 (*)    All other components within normal limits  CBC WITH DIFFERENTIAL/PLATELET - Abnormal; Notable for the following components:   Hemoglobin 9.5 (*)    HCT 28.0 (*)    MCV 72.0 (*)    MCH 24.4 (*)    RDW 19.8 (*)    Platelets 136 (*)    All other components within normal limits  LIPASE, BLOOD -  Abnormal; Notable for the following components:   Lipase 72 (*)    All other components within normal limits  URINALYSIS, ROUTINE W REFLEX MICROSCOPIC - Abnormal; Notable for the following components:   Color, Urine AMBER (*)    APPearance HAZY (*)    Protein, ur 30 (*)    Leukocytes,Ua SMALL (*)  All other components within normal limits  PROTIME-INR - Abnormal; Notable for the following components:   Prothrombin Time 18.0 (*)    INR 1.5 (*)    All other components within normal limits  CBG MONITORING, ED - Abnormal; Notable for the following components:   Glucose-Capillary 110 (*)    All other components within normal limits  RAPID URINE DRUG SCREEN, HOSP PERFORMED  ACETAMINOPHEN LEVEL  HEPATITIS PANEL, ACUTE  EPSTEIN-BARR VIRUS VCA, IGM  CMV IGM  MISC [facility] TEST (SEND OUT)    EKG None  Radiology US Abdomen Limited RUQ (LIVER/GB)  Result Date: 01/26/2022 CLINICAL DATA:  Elevated liver enzymes EXAM: ULTRASOUND ABDOMEN LIMITED RIGHT UPPER QUADRANT COMPARISON:  No prior ultrasound, correlation is made with 01/26/2022 CT abdomen pelvis FINDINGS: Gallbladder: Surgically absent. No sonographic Murphy's sign detected by sonographer. Common bile duct: Diameter: 4 mm.  No intrahepatic biliary ductal dilatation. Liver: No focal lesion identified. Within normal limits in parenchymal echogenicity. Portal vein is patent on color Doppler imaging with normal direction of blood flow towards the liver. Other: None. IMPRESSION: No acute process in the right upper quadrant. Electronically Signed   By: Merilyn Baba M.D.   On: 01/26/2022 02:40   CT ABDOMEN PELVIS W CONTRAST  Result Date: 01/26/2022 CLINICAL DATA:  Abdominal pain, acute, nonlocalized. Pt complains nausea and vomiting over the last couple weeks, she is having no stomach pains, she denies hematemesis or coffee-ground emesis she states her last bowel movement was yesterday denies melena or hematochez EXAM: CT ABDOMEN AND PELVIS WITH  CONTRAST TECHNIQUE: Multidetector CT imaging of the abdomen and pelvis was performed using the standard protocol following bolus administration of intravenous contrast. RADIATION DOSE REDUCTION: This exam was performed according to the departmental dose-optimization program which includes automated exposure control, adjustment of the mA and/or kV according to patient size and/or use of iterative reconstruction technique. CONTRAST:  67m OMNIPAQUE IOHEXOL 300 MG/ML  SOLN COMPARISON:  CT angio chest 10/04/2020 FINDINGS: Lower chest: No acute abnormality.  Tiny hiatal hernia. Hepatobiliary: No focal liver abnormality. Status post cholecystectomy. No biliary dilatation. Pancreas: No focal lesion. Normal pancreatic contour. No surrounding inflammatory changes. No main pancreatic ductal dilatation. Spleen: Normal in size without focal abnormality. Adrenals/Urinary Tract: No adrenal nodule bilaterally. Bilateral kidneys enhance symmetrically. Partially duplicated left collecting system. No hydronephrosis. No hydroureter. The urinary bladder is unremarkable. Stomach/Bowel: Stomach is within normal limits. No evidence of bowel wall thickening or dilatation. Colonic diverticulosis. Appendix appears normal. Vascular/Lymphatic: The main portal, splenic, superior mesenteric veins are patent. Left upper quadrant venous collaterals (3:14). No abdominal aorta or iliac aneurysm. Mild atherosclerotic plaque of the aorta and its branches. No abdominal, pelvic, or inguinal lymphadenopathy. Reproductive: Status post hysterectomy. No adnexal masses. Other: No intraperitoneal free fluid. No intraperitoneal free gas. No organized fluid collection. Musculoskeletal: No abdominal wall hernia or abnormality. No suspicious lytic or blastic osseous lesions. No acute displaced fracture. IMPRESSION: 1. Tiny hiatal hernia. 2. Colonic diverticulosis with no acute diverticulitis. Electronically Signed   By: MIven FinnM.D.   On: 01/26/2022 01:47     Procedures Procedures    Medications Ordered in ED Medications  ondansetron (ZOFRAN-ODT) disintegrating tablet 8 mg (8 mg Oral Given 01/25/22 2243)  lactated ringers bolus 1,000 mL (1,000 mLs Intravenous New Bag/Given 01/26/22 0049)  prochlorperazine (COMPAZINE) injection 10 mg (10 mg Intravenous Given 01/26/22 0048)  iohexol (OMNIPAQUE) 300 MG/ML solution 100 mL (80 mLs Intravenous Contrast Given 01/26/22 0127)    ED Course/ Medical Decision Making/  A&P                           Medical Decision Making Amount and/or Complexity of Data Reviewed Labs: ordered. Radiology: ordered.  Risk Prescription drug management.  Initially concern for biliary disease however she does not have a gallbladder and with her other symptoms also concern for possible pancreatic head mass we will get a CT of her abdomen pelvis. Supportive care otherwise.  Patient without any emesis since being here.  Vital signs stable.  Discussed her labs and imaging with Dr. Therisa Doyne, her gastroenterologist who asked to check a couple other viral labs but was okay following up as an outpatient.  Nausea meds per scribed.  Patient aware of findings and reasons to return to the ER.  Final Clinical Impression(s) / ED Diagnoses Final diagnoses:  Nausea and vomiting, unspecified vomiting type  Elevated liver enzymes    Rx / DC Orders ED Discharge Orders          Ordered    prochlorperazine (COMPAZINE) 25 MG suppository  Every 12 hours PRN        01/26/22 0632    prochlorperazine (COMPAZINE) 10 MG tablet  2 times daily PRN        01/26/22 0632              Bulah Lurie, Corene Cornea, MD 01/26/22 [phone]

## 2022-01-27 LAB — CMV IGM: CMV IgM: <30 AU/mL (ref 0.0–29.9)

## 2022-01-27 LAB — EPSTEIN-BARR VIRUS VCA, IGM: EBV VCA IgM: <36 U/mL (ref 0.0–35.9)

## 2022-02-02 LAB — MISC LABCORP TEST (SEND OUT): Labcorp test code: 9985

## 2022-07-14 ENCOUNTER — Encounter (HOSPITAL_COMMUNITY): Payer: Self-pay

## 2022-07-14 ENCOUNTER — Emergency Department (HOSPITAL_COMMUNITY): Payer: Medicaid Other

## 2022-07-14 ENCOUNTER — Inpatient Hospital Stay (HOSPITAL_COMMUNITY)
Admission: EM | Admit: 2022-07-14 | Discharge: 2022-07-18 | DRG: 378 | Disposition: A | Discharge status: Home / Self Care | Payer: Medicaid Other | Attending: Internal Medicine | Admitting: Internal Medicine

## 2022-07-14 ENCOUNTER — Other Ambulatory Visit: Payer: Self-pay

## 2022-07-14 DIAGNOSIS — K922 Gastrointestinal hemorrhage, unspecified: Secondary | ICD-10-CM | POA: Diagnosis not present

## 2022-07-14 DIAGNOSIS — K625 Hemorrhage of anus and rectum: Principal | ICD-10-CM

## 2022-07-14 DIAGNOSIS — K7460 Unspecified cirrhosis of liver: Secondary | ICD-10-CM | POA: Diagnosis present

## 2022-07-14 DIAGNOSIS — K648 Other hemorrhoids: Secondary | ICD-10-CM | POA: Diagnosis present

## 2022-07-14 DIAGNOSIS — I10 Essential (primary) hypertension: Secondary | ICD-10-CM | POA: Diagnosis present

## 2022-07-14 DIAGNOSIS — Z79899 Other long term (current) drug therapy: Secondary | ICD-10-CM

## 2022-07-14 DIAGNOSIS — K635 Polyp of colon: Secondary | ICD-10-CM | POA: Diagnosis present

## 2022-07-14 DIAGNOSIS — J301 Allergic rhinitis due to pollen: Secondary | ICD-10-CM | POA: Diagnosis not present

## 2022-07-14 DIAGNOSIS — D62 Acute posthemorrhagic anemia: Secondary | ICD-10-CM | POA: Diagnosis present

## 2022-07-14 DIAGNOSIS — Z720 Tobacco use: Secondary | ICD-10-CM

## 2022-07-14 DIAGNOSIS — K219 Gastro-esophageal reflux disease without esophagitis: Secondary | ICD-10-CM

## 2022-07-14 DIAGNOSIS — G43809 Other migraine, not intractable, without status migrainosus: Secondary | ICD-10-CM

## 2022-07-14 DIAGNOSIS — J309 Allergic rhinitis, unspecified: Secondary | ICD-10-CM | POA: Diagnosis present

## 2022-07-14 DIAGNOSIS — K644 Residual hemorrhoidal skin tags: Secondary | ICD-10-CM | POA: Diagnosis present

## 2022-07-14 DIAGNOSIS — K920 Hematemesis: Secondary | ICD-10-CM | POA: Diagnosis present

## 2022-07-14 DIAGNOSIS — K449 Diaphragmatic hernia without obstruction or gangrene: Secondary | ICD-10-CM | POA: Diagnosis present

## 2022-07-14 DIAGNOSIS — R040 Epistaxis: Secondary | ICD-10-CM | POA: Diagnosis present

## 2022-07-14 DIAGNOSIS — K5731 Diverticulosis of large intestine without perforation or abscess with bleeding: Principal | ICD-10-CM | POA: Diagnosis present

## 2022-07-14 DIAGNOSIS — B181 Chronic viral hepatitis B without delta-agent: Secondary | ICD-10-CM | POA: Diagnosis present

## 2022-07-14 DIAGNOSIS — G43909 Migraine, unspecified, not intractable, without status migrainosus: Secondary | ICD-10-CM | POA: Diagnosis present

## 2022-07-14 DIAGNOSIS — K766 Portal hypertension: Secondary | ICD-10-CM | POA: Diagnosis present

## 2022-07-14 DIAGNOSIS — C220 Liver cell carcinoma: Secondary | ICD-10-CM | POA: Diagnosis present

## 2022-07-14 HISTORY — DX: Unspecified viral hepatitis B without hepatic coma: B19.10

## 2022-07-14 LAB — CBC WITH DIFFERENTIAL/PLATELET
Abs Immature Granulocytes: 0.01 10*3/uL (ref 0.00–0.07)
Basophils Absolute: 0 10*3/uL (ref 0.0–0.1)
Basophils Relative: 1 %
Eosinophils Absolute: 0.1 10*3/uL (ref 0.0–0.5)
Eosinophils Relative: 2 %
HCT: 22.7 % — ABNORMAL LOW (ref 36.0–46.0)
Hemoglobin: 7.5 g/dL — ABNORMAL LOW (ref 12.0–15.0)
Immature Granulocytes: 0 %
Lymphocytes Relative: 25 %
Lymphs Abs: 1 10*3/uL (ref 0.7–4.0)
MCH: 26.4 pg (ref 26.0–34.0)
MCHC: 33 g/dL (ref 30.0–36.0)
MCV: 79.9 fL — ABNORMAL LOW (ref 80.0–100.0)
Monocytes Absolute: 0.4 10*3/uL (ref 0.1–1.0)
Monocytes Relative: 9 %
Neutro Abs: 2.3 10*3/uL (ref 1.7–7.7)
Neutrophils Relative %: 63 %
Platelets: 90 10*3/uL — ABNORMAL LOW (ref 150–400)
RBC: 2.84 MIL/uL — ABNORMAL LOW (ref 3.87–5.11)
RDW: 20.4 % — ABNORMAL HIGH (ref 11.5–15.5)
WBC: 3.7 10*3/uL — ABNORMAL LOW (ref 4.0–10.5)
nRBC: 0 % (ref 0.0–0.2)

## 2022-07-14 LAB — COMPREHENSIVE METABOLIC PANEL
ALT: 25 U/L (ref 0–44)
AST: 58 U/L — ABNORMAL HIGH (ref 15–41)
Albumin: 2 g/dL — ABNORMAL LOW (ref 3.5–5.0)
Alkaline Phosphatase: 80 U/L (ref 38–126)
Anion gap: 5 (ref 5–15)
BUN: 8 mg/dL (ref 6–20)
CO2: 24 mmol/L (ref 22–32)
Calcium: 8.5 mg/dL — ABNORMAL LOW (ref 8.9–10.3)
Chloride: 106 mmol/L (ref 98–111)
Creatinine, Ser: 1.27 mg/dL — ABNORMAL HIGH (ref 0.44–1.00)
GFR, Estimated: 49 mL/min — ABNORMAL LOW (ref >60)
Glucose, Bld: 134 mg/dL — ABNORMAL HIGH (ref 70–99)
Potassium: 3.7 mmol/L (ref 3.5–5.1)
Sodium: 135 mmol/L (ref 135–145)
Total Bilirubin: 2 mg/dL — ABNORMAL HIGH (ref 0.3–1.2)
Total Protein: 7.5 g/dL (ref 6.5–8.1)

## 2022-07-14 LAB — POC OCCULT BLOOD, ED: Fecal Occult Bld: POSITIVE — AB

## 2022-07-14 LAB — PROTIME-INR
INR: 1.6 — ABNORMAL HIGH (ref 0.8–1.2)
Prothrombin Time: 18.7 seconds — ABNORMAL HIGH (ref 11.4–15.2)

## 2022-07-14 LAB — ABO/RH: ABO/RH(D): A POS

## 2022-07-14 MED ORDER — ACETAMINOPHEN 325 MG PO TABS: 650.0000 mg | ORAL_TABLET | Freq: Four times a day (QID) | ORAL | Status: DC | PRN

## 2022-07-14 MED ORDER — IOHEXOL 350 MG/ML SOLN
100.0000 mL | Freq: Once | INTRAVENOUS | Status: AC | PRN
  Administered 2022-07-14: 100 mL via INTRAVENOUS

## 2022-07-14 MED ORDER — PANTOPRAZOLE SODIUM 40 MG IV SOLR
40.0000 mg | Freq: Once | INTRAVENOUS | Status: AC
  Administered 2022-07-14: 40 mg via INTRAVENOUS
  Filled 2022-07-14: qty 10

## 2022-07-14 MED ORDER — ACETAMINOPHEN 650 MG RE SUPP: 650.0000 mg | Freq: Four times a day (QID) | RECTAL | Status: DC | PRN

## 2022-07-14 MED ORDER — MELATONIN 3 MG PO TABS: 3.0000 mg | ORAL_TABLET | Freq: Every evening | ORAL | Status: DC | PRN

## 2022-07-14 NOTE — ED Provider Notes (Signed)
[facility] Provider Note   CSN: [id number] Arrival date & time: 07/14/22  1239     History  Chief Complaint  Patient presents with   Vaginal Bleeding    Letticia Tuzzolino is a 57 y.o. female with a past medical history of hypertension and anemia.  She reports that this morning she woke up and her "underwear was full of blood."  She denies any associated cramping.  Endorses a history of total hysterectomy.  Also says that her stool is sometimes dark and sometimes has blood mixed in as well.  Denies any blood thinners.  No NSAIDs or alcohol.  She believes that she had a blood transfusion "a long time ago when I was young."  Vaginal Bleeding Associated symptoms: no abdominal pain, no dizziness and no nausea        Home Medications Prior to Admission medications   Medication Sig Start Date End Date Taking? Authorizing Provider  acetaminophen (TYLENOL) 500 MG tablet Take 500 mg by mouth every 6 (six) hours as needed for mild pain.    [provider]  carvedilol (COREG) 6.25 MG tablet Take 1 tablet (6.25 mg total) by mouth 2 (two) times daily. 07/02/21   Donato Heinz, MD  cetirizine (ZYRTEC) 10 MG tablet Take 10 mg by mouth daily as needed for allergies.    [provider]  cyclobenzaprine (FLEXERIL) 10 MG tablet 1/2 to 1 tab by mouth every 8 hours as needed for shoulder and neck pain Patient taking differently: Take 10 mg by mouth every 8 (eight) hours as needed (shoulder and neck pain). 01/19/18   Mack Hook, MD  diclofenac Sodium (VOLTAREN) 1 % GEL Apply 4 g topically 4 (four) times daily. 06/03/19   Mack Hook, MD  Ferrous Gluconate 324 (37.5 Fe) MG TABS Take 324 mg by mouth 2 (two) times daily.     [provider]  fluticasone (FLONASE) 50 MCG/ACT nasal spray Place into both nostrils daily.    [provider]  guaiFENesin (MUCINEX) 600 MG 12 hr tablet Take 600 mg by mouth 2 (two)  times daily as needed for cough.    [provider]  lisinopril (ZESTRIL) 10 MG tablet Take 1 tablet (10 mg total) by mouth daily. 12/16/19   Mack Hook, MD  omeprazole (PRILOSEC) 40 MG capsule Take 1 capsule (40 mg total) by mouth daily. 03/05/19   Mack Hook, MD  pantoprazole (PROTONIX) 40 MG tablet Take 40 mg by mouth daily. 09/12/19   [provider]  Polyvinyl Alcohol-Povidone (REFRESH OP) Place 1 drop into both eyes daily as needed (dry eyes).    [provider]  prochlorperazine (COMPAZINE) 10 MG tablet Take 1 tablet (10 mg total) by mouth 2 (two) times daily as needed for nausea or vomiting. 01/26/22   Mesner, Corene Cornea, MD  prochlorperazine (COMPAZINE) 25 MG suppository Place 1 suppository (25 mg total) rectally every 12 (twelve) hours as needed for nausea or vomiting. 01/26/22   Mesner, Corene Cornea, MD  psyllium (METAMUCIL SMOOTH TEXTURE) 58.6 % powder Take 1 packet by mouth daily. Patient taking differently: Take 1 packet by mouth daily as needed (constipation). 03/05/19   Mack Hook, MD  SUMAtriptan (IMITREX) 50 MG tablet 1 tab by mouth for migraine headache, may repeat once in 2 hours if not relieved Patient taking differently: Take 50 mg by mouth every 2 (two) hours as needed for migraine. 01/19/18   Mack Hook, MD  tiZANidine (ZANAFLEX) 4 MG tablet Take  4 mg by mouth 2 (two) times daily. 12/22/20   [provider]  topiramate (TOPAMAX) 25 MG capsule Take 25 mg by mouth daily.    [provider]  Vitamin D, Ergocalciferol, (DRISDOL) 1.25 MG (50000 UNIT) CAPS capsule Take 50,000 Units by mouth every Monday.     [provider]      Allergies    Patient has no known allergies.    Review of Systems   Review of Systems  Respiratory:  Negative for shortness of breath.   Cardiovascular:  Negative for palpitations.  Gastrointestinal:  Positive for anal bleeding and blood in stool. Negative for abdominal pain,  constipation, diarrhea, nausea, rectal pain and vomiting.  Genitourinary:  Negative for vaginal bleeding.  Skin:  Negative for pallor.  Neurological:  Negative for dizziness and light-headedness.    Physical Exam Updated Vital Signs BP 130/62   Pulse 92   Temp 98.4 F (36.9 C) (Oral)   Resp 16   Ht '5\' 5"'$  (1.651 m)   Wt 87.7 kg   SpO2 100%   BMI 32.17 kg/m  Physical Exam Vitals and nursing note reviewed.  Constitutional:      Appearance: Normal appearance.  HENT:     Head: Normocephalic and atraumatic.  Eyes:     General: No scleral icterus.    Conjunctiva/sclera: Conjunctivae normal.  Pulmonary:     Effort: Pulmonary effort is normal. No respiratory distress.  Abdominal:     General: Abdomen is flat.     Palpations: Abdomen is soft.     Tenderness: There is no abdominal tenderness.  Genitourinary:    Comments: GU exam performed in presence of RN.  Patient had bright red blood from her rectum.  1 external, nonthrombosed and nonbleeding hemorrhoid Skin:    Findings: No rash.  Neurological:     Mental Status: She is alert.  Psychiatric:        Mood and Affect: Mood normal.     ED Results / Procedures / Treatments   Labs (all labs ordered are listed, but only abnormal results are displayed) Labs Reviewed  CBC WITH DIFFERENTIAL/PLATELET - Abnormal; Notable for the following components:      Result Value   WBC 3.7 (*)    RBC 2.84 (*)    Hemoglobin 7.5 (*)    HCT 22.7 (*)    MCV 79.9 (*)    RDW 20.4 (*)    Platelets 90 (*)    All other components within normal limits  COMPREHENSIVE METABOLIC PANEL - Abnormal; Notable for the following components:   Glucose, Bld 134 (*)    Creatinine, Ser 1.27 (*)    Calcium 8.5 (*)    Albumin 2.0 (*)    AST 58 (*)    Total Bilirubin 2.0 (*)    GFR, Estimated 49 (*)    All other components within normal limits  PROTIME-INR - Abnormal; Notable for the following components:   Prothrombin Time 18.7 (*)    INR 1.6 (*)    All  other components within normal limits  POC OCCULT BLOOD, ED - Abnormal; Notable for the following components:   Fecal Occult Bld POSITIVE (*)    All other components within normal limits  TYPE AND SCREEN  ABO/RH    EKG None  Radiology No results found.  Procedures Procedures   Medications Ordered in ED Medications - No data to display  ED Course/ Medical Decision Making/ A&P Clinical Course as of 07/14/22 1612  Thu Jul 14, 2022  [id number] Basic metabolic panel [AA]    Clinical Course User Index [AA] Evlyn Courier, PA-C                             Medical Decision Making Amount and/or Complexity of Data Reviewed Labs: ordered. Radiology: ordered.   57 year old female presenting today with concern of vaginal bleeding.  Upon further history taking it appears to be rectal bleeding.  Differential includes but is not limited to diverticular bleed, hemorrhoids, gastric ulcer, IBS/IBD and CRC.  This is not an exhaustive differential.    Past Medical History / Co-morbidities / Social History: [place] anemia, GERD, constipation, migraine, hypertension   Additional history:  Per chart review patient had a colonoscopy at the end of 2021 that showed diverticulosis.  No other findings  No blood thinners on her medication list   Physical Exam: Pertinent physical exam findings include Bright red blood per rectum, stool is tan.  Appears to be actively bleeding. Nontender abdomen Hemodynamically stable, not tachycardic  Lab Tests: I ordered, and personally interpreted labs.  The pertinent results include: Hemoglobin 7.5, down from 9.5 five months ago Baseline kidney function    Imaging Studies: I ordered and independently visualized and interpreted CT angiogram.  This imaging is pending at this time.  Patient is signed out to my attending physician, Dr. Matilde Sprang.  He will follow-up on imaging and dispo accordingly.  We have agreed to hold off on blood transfusion since patient is not  symptomatic of her anemia at this time.  Final Clinical Impression(s) / ED Diagnoses Final diagnoses:  Rectal bleeding      Rhae Hammock, PA-C 07/14/22 Arnetha Gula, MD 07/15/22 [phone]

## 2022-07-14 NOTE — ED Provider Triage Note (Signed)
Emergency Medicine Provider Triage Evaluation Note  Shakeara Font , a 57 y.o. female  was evaluated in triage.  Pt complains of vaginal bleeding since this morning.  She states she had to change 2 pads since this morning.  Denies any lightheadedness.  Denies abdominal pain.  Denies previous history of the same.  She does endorse history of epistaxis intermittently over the past month or 2.  Denies any known history of platelet issues.  Mild scleral icterus noted.  Patient states this is baseline.  Review of Systems  Positive: As above Negative: As above  Physical Exam  BP 130/62   Pulse 92   Temp 98.4 F (36.9 C) (Oral)   Resp 16   Ht 5' 5"$  (1.651 m)   Wt 87.7 kg   SpO2 100%   BMI 32.17 kg/m  Gen:   Awake, no distress   Resp:  Normal effort  MSK:   Moves extremities without difficulty  Other:    Medical Decision Making  Medically screening exam initiated at 1:08 PM.  Appropriate orders placed.  Tnia Szostek was informed that the remainder of the evaluation will be completed by another provider, this initial triage assessment does not replace that evaluation, and the importance of remaining in the ED until their evaluation is complete.     Evlyn Courier, PA-C 07/14/22 1309

## 2022-07-14 NOTE — H&P (Signed)
History and Physical      Marissa Warner [id number] DOB: 01-Jan-1966 DOA: 07/14/2022  PCP: Nolene Ebbs, MD  Patient coming from: home   I have personally briefly reviewed patient's old medical records in [place]  Chief Complaint: episode of bright red blood per rectum  HPI: Marissa Warner is a 57 y.o. female with medical history significant for essential hypertension, chronic iron deficiency anemia associated baseline hemoglobin range 9.5-11, allergic rhinitis, GERD, migraines, who is admitted to [facility] on 07/14/2022 with acute lower gastrointestinal bleed after presenting from home to [facility] ED complaining of episode of bright red blood per rectum..   The patient reports 1 episode of bright red blood per rectum on 07/14/2022.  Denies any associated abdominal pain or melena.  No recent trauma.  No associated any nausea, vomiting, or hematemesis.  She also denies any recent chest pain, shortness of breath, palpitations, diaphoresis, dizziness, presyncope, or syncope.  Not a blood thinners as an outpatient, including no aspirin.  She had a similar presentation in April thousand 21, which diagnosed with acute lower gastrointestinal bleed, believed to be diverticular in nature.  At that time, gastroenterology was consulted, and pursued endoscopic evaluation.  She has history of chronic iron deficiency anemia, with his hemoglobin range 9.5-11, most recent prior hemoglobin noted to be 9.5 on 01/25/2022.  Denies any regular or recent consumption of alcohol.    ED Course:  Vital signs in the ED were notable for the following: Afebrile; heart rate [id number]; systolic pressures in the 1 teens to 120s; respiratory rate 16-22, oxygen saturation 98% on room air.  Labs were notable for the following: CMP notable for sodium 135, Test 3.5, BUN 8.  CBC notable for white count 3700, hemoglobin 7.5 associated with microcytic finding as well as normochromic finding, platelet count 98.  INR 1.6.   Fecal occult blood test positive.  Imaging and additional notable ED work-up: CT GI bleed study showed no evidence of active contrast extravasation to suggest a focal site of GI bleed  EDP contacted on-call [facility] gastroenterology, Dr. Watt Climes, Requesting formal consultation.  While in the ED, the following were administered: Protonix 40 mg IV x 1 dose.  Subsequently, the patient was admitted for further evaluation management of suspected acute lower gastrointestinal bleed, associated with acute on chronic anemia.     Review of Systems: As per HPI otherwise 10 point review of systems negative.   Past Medical History:  Diagnosis Date   Allergy    environmental and seasonal   Blood transfusion without reported diagnosis before 2004   due to heavy periods   Hepatitis B    Hypertension    Migraines 2007   Renal disorder    Pt. states "Stage III Kidney disease"  Never had kidney biopsy.  She is unable to tell me what the cause of the kidney disease.  Reportedly diagnosed with [facility] in [place].    Past Surgical History:  Procedure Laterality Date   ABDOMINAL HYSTERECTOMY  2004   With BSO, she thinks   BIOPSY  09/09/2019   Procedure: BIOPSY;  Surgeon: Ronnette Juniper, MD;  Location: [facility] ENDOSCOPY;  Service: Gastroenterology;;   CHOLECYSTECTOMY  uncertain date   Laparoscopic   COLONOSCOPY WITH PROPOFOL N/A 09/09/2019   Procedure: COLONOSCOPY WITH PROPOFOL;  Surgeon: Ronnette Juniper, MD;  Location: [facility] ENDOSCOPY;  Service: Gastroenterology;  Laterality: N/A;   ESOPHAGOGASTRODUODENOSCOPY (EGD) WITH PROPOFOL N/A 09/09/2019   Procedure: ESOPHAGOGASTRODUODENOSCOPY (EGD) WITH PROPOFOL;  Surgeon: Ronnette Juniper,  MD;  Location: [facility] ENDOSCOPY;  Service: Gastroenterology;  Laterality: N/A;   LAPAROSCOPIC INCISIONAL / UMBILICAL / VENTRAL HERNIA REPAIR  2000    Social History:  reports that she has never smoked. Her smokeless tobacco use includes chew. She reports that she does not drink alcohol  and does not use drugs.   No Known Allergies  History reviewed. No pertinent family history.  Family history reviewed and not pertinent    Prior to Admission medications   Medication Sig Start Date End Date Taking? Authorizing Provider  acetaminophen (TYLENOL) 500 MG tablet Take 500 mg by mouth every 6 (six) hours as needed for mild pain.    [provider]  carvedilol (COREG) 6.25 MG tablet Take 1 tablet (6.25 mg total) by mouth 2 (two) times daily. 07/02/21   Donato Heinz, MD  cetirizine (ZYRTEC) 10 MG tablet Take 10 mg by mouth daily as needed for allergies.    [provider]  cyclobenzaprine (FLEXERIL) 10 MG tablet 1/2 to 1 tab by mouth every 8 hours as needed for shoulder and neck pain Patient taking differently: Take 10 mg by mouth every 8 (eight) hours as needed (shoulder and neck pain). 01/19/18   Mack Hook, MD  diclofenac Sodium (VOLTAREN) 1 % GEL Apply 4 g topically 4 (four) times daily. 06/03/19   Mack Hook, MD  Ferrous Gluconate 324 (37.5 Fe) MG TABS Take 324 mg by mouth 2 (two) times daily.     [provider]  fluticasone (FLONASE) 50 MCG/ACT nasal spray Place into both nostrils daily.    [provider]  guaiFENesin (MUCINEX) 600 MG 12 hr tablet Take 600 mg by mouth 2 (two) times daily as needed for cough.    [provider]  lisinopril (ZESTRIL) 10 MG tablet Take 1 tablet (10 mg total) by mouth daily. 12/16/19   Mack Hook, MD  omeprazole (PRILOSEC) 40 MG capsule Take 1 capsule (40 mg total) by mouth daily. 03/05/19   Mack Hook, MD  pantoprazole (PROTONIX) 40 MG tablet Take 40 mg by mouth daily. 09/12/19   [provider]  Polyvinyl Alcohol-Povidone (REFRESH OP) Place 1 drop into both eyes daily as needed (dry eyes).    [provider]  prochlorperazine (COMPAZINE) 10 MG tablet Take 1 tablet (10 mg total) by mouth 2 (two) times daily as needed for nausea or vomiting.  01/26/22   Mesner, Corene Cornea, MD  prochlorperazine (COMPAZINE) 25 MG suppository Place 1 suppository (25 mg total) rectally every 12 (twelve) hours as needed for nausea or vomiting. 01/26/22   Mesner, Corene Cornea, MD  psyllium (METAMUCIL SMOOTH TEXTURE) 58.6 % powder Take 1 packet by mouth daily. Patient taking differently: Take 1 packet by mouth daily as needed (constipation). 03/05/19   Mack Hook, MD  SUMAtriptan (IMITREX) 50 MG tablet 1 tab by mouth for migraine headache, may repeat once in 2 hours if not relieved Patient taking differently: Take 50 mg by mouth every 2 (two) hours as needed for migraine. 01/19/18   Mack Hook, MD  tiZANidine (ZANAFLEX) 4 MG tablet Take 4 mg by mouth 2 (two) times daily. 12/22/20   [provider]  topiramate (TOPAMAX) 25 MG capsule Take 25 mg by mouth daily.    [provider]  Vitamin D, Ergocalciferol, (DRISDOL) 1.25 MG (50000 UNIT) CAPS capsule Take 50,000 Units by mouth every Monday.     [provider]     Objective    Physical Exam: Vitals:   07/14/22 1845 07/14/22 2000  07/14/22 2015 07/14/22 2148  BP: (!) 121/90 109/62 116/64   Pulse: 83 84 80   Resp:  (!) 22 16   Temp:    98.3 F (36.8 C)  TempSrc:      SpO2: 100% 100% 98%   Weight:      Height:        General: appears to be stated age; alert, oriented Skin: warm, dry, no rash Head:  AT/[place] Mouth:  Oral mucosa membranes appear moist, normal dentition Neck: supple; trachea midline Heart:  RRR; did not appreciate any M/R/G Lungs: CTAB, did not appreciate any wheezes, rales, or rhonchi Abdomen: + BS; soft, ND, NT Vascular: 2+ pedal pulses b/l; 2+ radial pulses b/l Extremities: no peripheral edema, no muscle wasting Neuro: strength and sensation intact in upper and lower extremities b/l    Labs on Admission: I have personally reviewed following labs and imaging studies  CBC: Recent Labs  Lab 07/14/22 1310  WBC 3.7*  NEUTROABS 2.3  HGB 7.5*  HCT  22.7*  MCV 79.9*  PLT 90*   Basic Metabolic Panel: Recent Labs  Lab 07/14/22 1309  NA 135  K 3.7  CL 106  CO2 24  GLUCOSE 134*  BUN 8  CREATININE 1.27*  CALCIUM 8.5*   GFR: Estimated Creatinine Clearance: 52.8 mL/min (A) (by C-G formula based on SCr of 1.27 mg/dL (H)). Liver Function Tests: Recent Labs  Lab 07/14/22 1309  AST 58*  ALT 25  ALKPHOS 80  BILITOT 2.0*  PROT 7.5  ALBUMIN 2.0*   No results for input(s): "LIPASE", "AMYLASE" in the last 168 hours. No results for input(s): "AMMONIA" in the last 168 hours. Coagulation Profile: Recent Labs  Lab 07/14/22 1714  INR 1.6*   Cardiac Enzymes: No results for input(s): "CKTOTAL", "CKMB", "CKMBINDEX", "TROPONINI" in the last 168 hours. BNP (last 3 results) No results for input(s): "PROBNP" in the last 8760 hours. HbA1C: No results for input(s): "HGBA1C" in the last 72 hours. CBG: No results for input(s): "GLUCAP" in the last 168 hours. Lipid Profile: No results for input(s): "CHOL", "HDL", "LDLCALC", "TRIG", "CHOLHDL", "LDLDIRECT" in the last 72 hours. Thyroid Function Tests: No results for input(s): "TSH", "T4TOTAL", "FREET4", "T3FREE", "THYROIDAB" in the last 72 hours. Anemia Panel: No results for input(s): "VITAMINB12", "FOLATE", "FERRITIN", "TIBC", "IRON", "RETICCTPCT" in the last 72 hours. Urine analysis:    Component Value Date/Time   COLORURINE AMBER (A) 01/25/2022 2249   APPEARANCEUR HAZY (A) 01/25/2022 2249   LABSPEC 1.020 01/25/2022 2249   PHURINE 5.0 01/25/2022 2249   GLUCOSEU NEGATIVE 01/25/2022 2249   HGBUR NEGATIVE 01/25/2022 2249   BILIRUBINUR NEGATIVE 01/25/2022 2249   BILIRUBINUR small (1+) 12/31/2018 1343   KETONESUR NEGATIVE 01/25/2022 2249   PROTEINUR 30 (A) 01/25/2022 2249   UROBILINOGEN 0.2 12/31/2018 1343   NITRITE NEGATIVE 01/25/2022 2249   LEUKOCYTESUR SMALL (A) 01/25/2022 2249    Radiological Exams on Admission: CT ANGIO GI BLEED  Result Date: 07/14/2022 CLINICAL DATA:   Follow-up abdominal aortic aneurysm. Vaginal bleeding. EXAM: CTA ABDOMEN AND PELVIS WITHOUT AND WITH CONTRAST TECHNIQUE: Multidetector CT imaging of the abdomen and pelvis was performed using the standard protocol during bolus administration of intravenous contrast. Multiplanar reconstructed images and MIPs were obtained and reviewed to evaluate the vascular anatomy. RADIATION DOSE REDUCTION: This exam was performed according to the departmental dose-optimization program which includes automated exposure control, adjustment of the mA and/or kV according to patient size and/or use of iterative reconstruction technique. CONTRAST:  126m OMNIPAQUE IOHEXOL  350 MG/ML SOLN COMPARISON:  01/26/2022 FINDINGS: VASCULAR Aorta: Unenhanced images demonstrate no significant vascular calcifications. Surgical clips in the right upper quadrant consistent with cholecystectomy. Surgical clips in the pelvis consistent with tubal ligations. Normal caliber abdominal aorta. No aneurysm or dissection. No significant stenosis. Celiac: Patent without evidence of aneurysm, dissection, vasculitis or significant stenosis. SMA: Patent without evidence of aneurysm, dissection, vasculitis or significant stenosis. Renals: Both renal arteries are patent without evidence of aneurysm, dissection, vasculitis, fibromuscular dysplasia or significant stenosis. IMA: Patent without evidence of aneurysm, dissection, vasculitis or significant stenosis. Inflow: Patent without evidence of aneurysm, dissection, vasculitis or significant stenosis. Proximal Outflow: Bilateral common femoral and visualized portions of the superficial and profunda femoral arteries are patent without evidence of aneurysm, dissection, vasculitis or significant stenosis. Veins: No obvious venous abnormality within the limitations of this arterial phase study. Review of the MIP images confirms the above findings. NON-VASCULAR Lower chest: Trace left pleural effusion with basilar  atelectasis. Small esophageal hiatal hernia. Hepatobiliary: No focal liver abnormality is seen. Status post cholecystectomy. No biliary dilatation. Pancreas: Unremarkable. No pancreatic ductal dilatation or surrounding inflammatory changes. Spleen: Normal in size without focal abnormality. Adrenals/Urinary Tract: Adrenal glands are unremarkable. Kidneys are normal, without renal calculi, focal lesion, or hydronephrosis. Bladder is unremarkable. Stomach/Bowel: Stomach, small bowel, and colon are not abnormally distended. There is evidence of some wall thickening in the colon with areas including the cecum, sigmoid colon, and rectum involved. This may represent inflammatory process such as Crohn disease or other colitis. Diverticulosis of the sigmoid colon without evidence of acute diverticulitis. Appendix is normal. No evidence of any focal area of intraluminal contrast opacification, suggesting no identified areas of active extravasation/gastrointestinal bleeding. Lymphatic: No significant lymphadenopathy. Reproductive: Surgical absence of the uterus. No abnormal adnexal masses. Other: No free air or free fluid in the abdomen. Abdominal wall musculature appears intact. Musculoskeletal: Degenerative changes in the spine. IMPRESSION: VASCULAR Normal abdominal aorta. No evidence of aneurysm or dissection. No large vessel occlusion or acute abnormality. NON-VASCULAR 1. No evidence of active contrast extravasation to suggest a focal site of gastrointestinal bleeding. 2. Colonic wall thickening involving the rectosigmoid and cecal regions suggesting colitis, possibly Crohn disease. 3. Trace left pleural effusion with basilar atelectasis. Electronically Signed   By: Lucienne Capers M.D.   On: 07/14/2022 21:38   US PELVIC COMPLETE WITH TRANSVAGINAL  Result Date: 07/14/2022 CLINICAL DATA:  [id number] Vaginal bleeding [id number] EXAM: ULTRASOUND OF PELVIS TECHNIQUE: Transabdominal and transvaginalultrasound examination of the  pelvis was performed including evaluation of the uterus, ovaries, adnexal regions, and pelvic cul-de-sac. COMPARISON:  None Available. FINDINGS: Patient is status post hysterectomy. Ovaries are not identified. No masses or fluid collections are seen. IMPRESSION: Status post hysterectomy. No adnexal pathology. Ovaries not identified. Electronically Signed   By: Sammie Bench M.D.   On: 07/14/2022 17:02      Assessment/Plan   Principal Problem:   Acute lower GI bleeding Active Problems:   Essential hypertension   Acute on chronic blood loss anemia   GERD (gastroesophageal reflux disease)   Allergic rhinitis   Migraines      #) Acute lower GI bleed: diagnosis on the basis of 1 episode of bright red blood per rectum, and with DRE revealing Hemoccult positive stool.  A non-elevated presenting BUN further substantiates suspicion of a lower GI source. Also a/w evidence of acute blood loss anemia, with presenting Hgb noted to be 7.5 relative to baseline range of 9.5-11.   Not on any blood  thinners as an outpatient, including no aspirin. No reported history of alcohol abuse. INR 1.6, potentially in setting of documented history of hepatitis B. Differential includes diverticular bleed, particularly given a known history of prior diverticular bleed with very similar presentation in April 2021.     Patient appears hemodynamically stable with normotensive/non-tachycardic vital signs thus far, and is currently asymptomatic.   Given suspected lower GI source, there does not appear to be an indication for initiation of Protonix drip. Of note, patient was typed and screened in the ED today.  EDP consulted on-call gastroenterologist, with additional recs pending at this time. .      Plan: NPO. Refraining from pharmacologic DVT prophylaxis. Monitor on telemetry. Monitor continuous pulse-ox. Maintain at least 2 large bore IV's. Check INR in the AM. Q4H H&H's have been ordered through 9 AM on 07/15/2022. Will  closely monitor these ensuing Hgb levels and correlate these data points with the patient's overall clinical picture including vital signs to determine need for subsequent transfusion.        #) Acute on chronic blood loss anemia: in the setting of suspected acute lower GI bleed, as above, presenting Hgb noted to be 7.5 compared to baseline ranges of 9.5-11.  In the setting of her document history of chronic loss anemia, she was noted to be on chronic daily oral iron supplementation. At this time, patient appears hemodynamically stable and asymptomatic, as further described above.  Type and screen initiated in the ED today.   Plan: work-up and management for presenting suspected acute lower GI bleed, as above, including close monitoring of Q4H H&H's, with clinical evaluation for determination of need for blood transfusion, as further described above. Monitor on telemetry. Monitor continuous pulse-ox. NPO. Refraining from pharmacologic DVT prophylaxis. Check INR in the morning. Add on the following to initial lab specimen collected in the ED today: total iron, TIBC, ferritin, reticulocyte count.  GI consulted, as above.              #) Essential Hypertension: documented h/o such, with outpatient antihypertensive regimen including lisinopril, Coreg.  SBP's in the ED today: Normotensive, as quantified above.    Plan: Close monitoring of subsequent BP via routine VS. in setting of presenting acute gastrointestinal bleed, complicated by acute on chronic blood loss anemia, will hold home lisinopril and Coreg for now.               #) Allergic Rhinitis: documented h/o such, on scheduled intranasal Flonase as outpatient.    Plan: cont home Flonase.                  #) GERD: documented h/o such; on omeprazole as outpatient.   Plan: In the setting of current n.p.o. status, transiently transition to daily IV Protonix.            #) History of migraines:  On prophylactic Topamax and outpatient.  Plan: The setting of current n.p.o. status, will hold next dose of prophylactic Topamax.      DVT prophylaxis: SCD's   Code Status: Full code Family Communication: none Disposition Plan: Per Rounding Team Consults called:  EDP contacted on-call [facility] gastroenterology, Dr. Watt Climes, Requesting formal consultation, as further detailed above;  Admission status: Observation     I SPENT GREATER THAN 75  MINUTES IN CLINICAL CARE TIME/MEDICAL DECISION-MAKING IN COMPLETING THIS ADMISSION.      [place] DO [facility]  From [place]   07/14/2022, 11:13 PM

## 2022-07-14 NOTE — ED Notes (Signed)
Patient transported to CT 

## 2022-07-14 NOTE — ED Triage Notes (Signed)
Pt states she woke up and had a lot of blood in underwear and nose bleed. Pt denies blood thinners. Pt states she is soaking more than one pad an hour

## 2022-07-15 ENCOUNTER — Inpatient Hospital Stay (HOSPITAL_COMMUNITY): Payer: Medicaid Other

## 2022-07-15 ENCOUNTER — Encounter (HOSPITAL_COMMUNITY): Payer: Self-pay | Admitting: Internal Medicine

## 2022-07-15 DIAGNOSIS — J309 Allergic rhinitis, unspecified: Secondary | ICD-10-CM | POA: Diagnosis present

## 2022-07-15 DIAGNOSIS — N189 Chronic kidney disease, unspecified: Secondary | ICD-10-CM | POA: Diagnosis not present

## 2022-07-15 DIAGNOSIS — I10 Essential (primary) hypertension: Secondary | ICD-10-CM | POA: Diagnosis present

## 2022-07-15 DIAGNOSIS — K644 Residual hemorrhoidal skin tags: Secondary | ICD-10-CM | POA: Diagnosis present

## 2022-07-15 DIAGNOSIS — R040 Epistaxis: Secondary | ICD-10-CM | POA: Diagnosis present

## 2022-07-15 DIAGNOSIS — K219 Gastro-esophageal reflux disease without esophagitis: Secondary | ICD-10-CM | POA: Diagnosis present

## 2022-07-15 DIAGNOSIS — Z720 Tobacco use: Secondary | ICD-10-CM | POA: Diagnosis not present

## 2022-07-15 DIAGNOSIS — K5730 Diverticulosis of large intestine without perforation or abscess without bleeding: Secondary | ICD-10-CM | POA: Diagnosis not present

## 2022-07-15 DIAGNOSIS — K7460 Unspecified cirrhosis of liver: Secondary | ICD-10-CM | POA: Diagnosis present

## 2022-07-15 DIAGNOSIS — D62 Acute posthemorrhagic anemia: Secondary | ICD-10-CM | POA: Diagnosis present

## 2022-07-15 DIAGNOSIS — K922 Gastrointestinal hemorrhage, unspecified: Secondary | ICD-10-CM | POA: Diagnosis not present

## 2022-07-15 DIAGNOSIS — Z79899 Other long term (current) drug therapy: Secondary | ICD-10-CM | POA: Diagnosis not present

## 2022-07-15 DIAGNOSIS — K5731 Diverticulosis of large intestine without perforation or abscess with bleeding: Secondary | ICD-10-CM | POA: Diagnosis present

## 2022-07-15 DIAGNOSIS — D123 Benign neoplasm of transverse colon: Secondary | ICD-10-CM | POA: Diagnosis not present

## 2022-07-15 DIAGNOSIS — K625 Hemorrhage of anus and rectum: Secondary | ICD-10-CM | POA: Diagnosis present

## 2022-07-15 DIAGNOSIS — K635 Polyp of colon: Secondary | ICD-10-CM | POA: Diagnosis present

## 2022-07-15 DIAGNOSIS — K449 Diaphragmatic hernia without obstruction or gangrene: Secondary | ICD-10-CM | POA: Diagnosis present

## 2022-07-15 DIAGNOSIS — G43909 Migraine, unspecified, not intractable, without status migrainosus: Secondary | ICD-10-CM | POA: Diagnosis present

## 2022-07-15 DIAGNOSIS — K920 Hematemesis: Secondary | ICD-10-CM | POA: Diagnosis present

## 2022-07-15 DIAGNOSIS — C220 Liver cell carcinoma: Secondary | ICD-10-CM | POA: Diagnosis present

## 2022-07-15 DIAGNOSIS — B181 Chronic viral hepatitis B without delta-agent: Secondary | ICD-10-CM | POA: Diagnosis present

## 2022-07-15 DIAGNOSIS — I129 Hypertensive chronic kidney disease with stage 1 through stage 4 chronic kidney disease, or unspecified chronic kidney disease: Secondary | ICD-10-CM | POA: Diagnosis not present

## 2022-07-15 DIAGNOSIS — K766 Portal hypertension: Secondary | ICD-10-CM | POA: Diagnosis present

## 2022-07-15 DIAGNOSIS — D631 Anemia in chronic kidney disease: Secondary | ICD-10-CM | POA: Diagnosis not present

## 2022-07-15 DIAGNOSIS — K648 Other hemorrhoids: Secondary | ICD-10-CM | POA: Diagnosis present

## 2022-07-15 LAB — COMPREHENSIVE METABOLIC PANEL
ALT: 21 U/L (ref 0–44)
AST: 54 U/L — ABNORMAL HIGH (ref 15–41)
Albumin: 1.9 g/dL — ABNORMAL LOW (ref 3.5–5.0)
Alkaline Phosphatase: 85 U/L (ref 38–126)
Anion gap: 5 (ref 5–15)
BUN: 7 mg/dL (ref 6–20)
CO2: 21 mmol/L — ABNORMAL LOW (ref 22–32)
Calcium: 8.1 mg/dL — ABNORMAL LOW (ref 8.9–10.3)
Chloride: 107 mmol/L (ref 98–111)
Creatinine, Ser: 1.16 mg/dL — ABNORMAL HIGH (ref 0.44–1.00)
GFR, Estimated: 55 mL/min — ABNORMAL LOW (ref >60)
Glucose, Bld: 121 mg/dL — ABNORMAL HIGH (ref 70–99)
Potassium: 3.8 mmol/L (ref 3.5–5.1)
Sodium: 133 mmol/L — ABNORMAL LOW (ref 135–145)
Total Bilirubin: 1.8 mg/dL — ABNORMAL HIGH (ref 0.3–1.2)
Total Protein: 7.4 g/dL (ref 6.5–8.1)

## 2022-07-15 LAB — CBC WITH DIFFERENTIAL/PLATELET
Abs Immature Granulocytes: 0.01 10*3/uL (ref 0.00–0.07)
Basophils Absolute: 0 10*3/uL (ref 0.0–0.1)
Basophils Relative: 1 %
Eosinophils Absolute: 0.1 10*3/uL (ref 0.0–0.5)
Eosinophils Relative: 3 %
HCT: 22.4 % — ABNORMAL LOW (ref 36.0–46.0)
Hemoglobin: 7.6 g/dL — ABNORMAL LOW (ref 12.0–15.0)
Immature Granulocytes: 0 %
Lymphocytes Relative: 40 %
Lymphs Abs: 1.6 10*3/uL (ref 0.7–4.0)
MCH: 26.7 pg (ref 26.0–34.0)
MCHC: 33.9 g/dL (ref 30.0–36.0)
MCV: 78.6 fL — ABNORMAL LOW (ref 80.0–100.0)
Monocytes Absolute: 0.4 10*3/uL (ref 0.1–1.0)
Monocytes Relative: 9 %
Neutro Abs: 2 10*3/uL (ref 1.7–7.7)
Neutrophils Relative %: 47 %
Platelets: 81 10*3/uL — ABNORMAL LOW (ref 150–400)
RBC: 2.85 MIL/uL — ABNORMAL LOW (ref 3.87–5.11)
RDW: 20.7 % — ABNORMAL HIGH (ref 11.5–15.5)
WBC: 4.1 10*3/uL (ref 4.0–10.5)
nRBC: 0 % (ref 0.0–0.2)

## 2022-07-15 LAB — MAGNESIUM
Magnesium: 1.8 mg/dL (ref 1.7–2.4)
Magnesium: 1.7 mg/dL (ref 1.7–2.4)

## 2022-07-15 LAB — HEMOGLOBIN AND HEMATOCRIT, BLOOD
HCT: 22.5 % — ABNORMAL LOW (ref 36.0–46.0)
Hemoglobin: 7.6 g/dL — ABNORMAL LOW (ref 12.0–15.0)

## 2022-07-15 LAB — IRON AND TIBC
Iron: 140 ug/dL (ref 28–170)
Saturation Ratios: 81 % — ABNORMAL HIGH (ref 10.4–31.8)
TIBC: 172 ug/dL — ABNORMAL LOW (ref 250–450)
UIBC: 32 ug/dL

## 2022-07-15 LAB — RETICULOCYTES
Immature Retic Fract: 24.2 % — ABNORMAL HIGH (ref 2.3–15.9)
RBC.: 2.79 MIL/uL — ABNORMAL LOW (ref 3.87–5.11)
Retic Count, Absolute: 103.8 10*3/uL (ref 19.0–186.0)
Retic Ct Pct: 3.7 % — ABNORMAL HIGH (ref 0.4–3.1)

## 2022-07-15 LAB — FERRITIN: Ferritin: 555 ng/mL — ABNORMAL HIGH (ref 11–307)

## 2022-07-15 LAB — PROTIME-INR
INR: 1.7 — ABNORMAL HIGH (ref 0.8–1.2)
Prothrombin Time: 19.7 seconds — ABNORMAL HIGH (ref 11.4–15.2)

## 2022-07-15 MED ORDER — PANTOPRAZOLE SODIUM 40 MG IV SOLR
40.0000 mg | INTRAVENOUS | Status: DC
  Administered 2022-07-15 – 2022-07-17 (×3): 40 mg via INTRAVENOUS
  Filled 2022-07-15 (×3): qty 10

## 2022-07-15 MED ORDER — SODIUM CHLORIDE 0.9 % IV SOLN: INTRAVENOUS | Status: DC

## 2022-07-15 MED ORDER — CARVEDILOL 6.25 MG PO TABS
6.2500 mg | ORAL_TABLET | Freq: Two times a day (BID) | ORAL | Status: DC
  Administered 2022-07-15 – 2022-07-17 (×5): 6.25 mg via ORAL
  Filled 2022-07-15 (×6): qty 1

## 2022-07-15 MED ORDER — FLUTICASONE PROPIONATE 50 MCG/ACT NA SUSP
1.0000 | Freq: Every day | NASAL | Status: DC
  Administered 2022-07-15 – 2022-07-18 (×4): 1 via NASAL
  Filled 2022-07-15: qty 16

## 2022-07-15 NOTE — ED Notes (Signed)
RN checked pt BP on both arms. Left BP 99/54 and Right 109/62 Pt is Aox4

## 2022-07-15 NOTE — ED Notes (Signed)
ED TO INPATIENT HANDOFF REPORT  ED Nurse Name and Phone #: Juel Burrow [phone]  S Name/Age/Gender Marissa Warner 57 y.o. female Room/Bed: 001C/001C  Code Status   Code Status: Full Code  Home/SNF/Other Home Patient oriented to: self, place, time, and situation Is this baseline? Yes   Triage Complete: Triage complete  Chief Complaint Acute lower GI bleeding [K92.2]  Triage Note Pt states she woke up and had a lot of blood in underwear and nose bleed. Pt denies blood thinners. Pt states she is soaking more than one pad an hour    Allergies No Known Allergies  Level of Care/Admitting Diagnosis ED Disposition     ED Disposition  Admit   Condition  --   Comment  Hospital Area: [place] [[id number]]  Level of Care: Telemetry Medical [104]  May place patient in observation at [facility] or [place] if equivalent level of care is available:: No  Covid Evaluation: Asymptomatic - no recent exposure (last 10 days) testing not required  Diagnosis: Acute lower GI bleeding [id number]  Admitting Physician: Rhetta Mura [id number]  Attending Physician: Rhetta Mura [id number]          B Medical/Surgery History Past Medical History:  Diagnosis Date   Allergy    environmental and seasonal   Blood transfusion without reported diagnosis before 2004   due to heavy periods   Hepatitis B    Hypertension    Migraines 2007   Renal disorder    Pt. states "Stage III Kidney disease"  Never had kidney biopsy.  She is unable to tell me what the cause of the kidney disease.  Reportedly diagnosed with [facility] in [place].   Past Surgical History:  Procedure Laterality Date   ABDOMINAL HYSTERECTOMY  2004   With BSO, she thinks   BIOPSY  09/09/2019   Procedure: BIOPSY;  Surgeon: Ronnette Juniper, MD;  Location: [facility];  Service: Gastroenterology;;   CHOLECYSTECTOMY  uncertain date   Laparoscopic   COLONOSCOPY WITH PROPOFOL N/A 09/09/2019    Procedure: COLONOSCOPY WITH PROPOFOL;  Surgeon: Ronnette Juniper, MD;  Location: [facility];  Service: Gastroenterology;  Laterality: N/A;   ESOPHAGOGASTRODUODENOSCOPY (EGD) WITH PROPOFOL N/A 09/09/2019   Procedure: ESOPHAGOGASTRODUODENOSCOPY (EGD) WITH PROPOFOL;  Surgeon: Ronnette Juniper, MD;  Location: [facility];  Service: Gastroenterology;  Laterality: N/A;   LAPAROSCOPIC INCISIONAL / UMBILICAL / VENTRAL HERNIA REPAIR  2000     A IV Location/Drains/Wounds Patient Lines/Drains/Airways Status     Active Line/Drains/Airways     Name Placement date Placement time Site Days   Peripheral IV 07/14/22 18 G Anterior;Proximal;Right Antecubital 07/14/22  --  Antecubital  1            Intake/Output Last 24 hours No intake or output data in the 24 hours ending 07/15/22 1008  Labs/Imaging Results for orders placed or performed during the hospital encounter of 07/14/22 (from the past 48 hour(s))  Comprehensive metabolic panel     Status: Abnormal   Collection Time: 07/14/22  1:09 PM  Result Value Ref Range   Sodium 135 135 - 145 mmol/L   Potassium 3.7 3.5 - 5.1 mmol/L   Chloride 106 98 - 111 mmol/L   CO2 24 22 - 32 mmol/L   Glucose, Bld 134 (H) 70 - 99 mg/dL    Comment: Glucose reference range applies only to samples taken after fasting for at least 8 hours.   BUN 8 6 - 20 mg/dL   Creatinine, Ser  1.27 (H) 0.44 - 1.00 mg/dL   Calcium 8.5 (L) 8.9 - 10.3 mg/dL   Total Protein 7.5 6.5 - 8.1 g/dL   Albumin 2.0 (L) 3.5 - 5.0 g/dL   AST 58 (H) 15 - 41 U/L   ALT 25 0 - 44 U/L   Alkaline Phosphatase 80 38 - 126 U/L   Total Bilirubin 2.0 (H) 0.3 - 1.2 mg/dL   GFR, Estimated 49 (L) >60 mL/min    Comment: (NOTE) Calculated using the CKD-EPI Creatinine Equation (2021)    Anion gap 5 5 - 15    Comment: Performed at [facility], [place] [address], [place], [place] [address]  Iron and TIBC     Status: Abnormal   Collection Time: 07/14/22  1:09 PM  Result Value Ref Range   Iron 140 28 - 170  ug/dL   TIBC 172 (L) 250 - 450 ug/dL   Saturation Ratios 81 (H) 10.4 - 31.8 %   UIBC 32 ug/dL    Comment: Performed at [facility] [address] [place], [place] [address]  Ferritin     Status: Abnormal   Collection Time: 07/14/22  1:09 PM  Result Value Ref Range   Ferritin 555 (H) 11 - 307 ng/mL    Comment: Performed at [facility], [place] [address], [place], [place] [address]  Magnesium     Status: None   Collection Time: 07/14/22  1:09 PM  Result Value Ref Range   Magnesium 1.8 1.7 - 2.4 mg/dL    Comment: Performed at [facility], [place] [address], [place], [place] [address]  CBC with Differential     Status: Abnormal   Collection Time: 07/14/22  1:10 PM  Result Value Ref Range   WBC 3.7 (L) 4.0 - 10.5 K/uL   RBC 2.84 (L) 3.87 - 5.11 MIL/uL   Hemoglobin 7.5 (L) 12.0 - 15.0 g/dL   HCT 22.7 (L) 36.0 - 46.0 %   MCV 79.9 (L) 80.0 - 100.0 fL   MCH 26.4 26.0 - 34.0 pg   MCHC 33.0 30.0 - 36.0 g/dL   RDW 20.4 (H) 11.5 - 15.5 %   Platelets 90 (L) 150 - 400 K/uL    Comment: Immature Platelet Fraction may be clinically indicated, consider ordering this additional test [id number] REPEATED TO VERIFY    nRBC 0.0 0.0 - 0.2 %   Neutrophils Relative % 63 %   Neutro Abs 2.3 1.7 - 7.7 K/uL   Lymphocytes Relative 25 %   Lymphs Abs 1.0 0.7 - 4.0 K/uL   Monocytes Relative 9 %   Monocytes Absolute 0.4 0.1 - 1.0 K/uL   Eosinophils Relative 2 %   Eosinophils Absolute 0.1 0.0 - 0.5 K/uL   Basophils Relative 1 %   Basophils Absolute 0.0 0.0 - 0.1 K/uL   Immature Granulocytes 0 %   Abs Immature Granulocytes 0.01 0.00 - 0.07 K/uL    Comment: Performed at [facility], [place] [address] [place], [place] [address]  ABO/Rh     Status: None   Collection Time: 07/14/22  1:10 PM  Result Value Ref Range   ABO/RH(D)      A POS Performed at [facility] [address] [place], [place] [address]   Type and screen [place]     Status: None    Collection Time: 07/14/22  5:14 PM  Result Value Ref Range   ABO/RH(D) A POS  Antibody Screen NEG    Sample Expiration      07/17/2022,2359 Performed at [facility], [place] [address], [place], [place] [address]   Protime-INR     Status: Abnormal   Collection Time: 07/14/22  5:14 PM  Result Value Ref Range   Prothrombin Time 18.7 (H) 11.4 - 15.2 seconds   INR 1.6 (H) 0.8 - 1.2    Comment: (NOTE) INR goal varies based on device and disease states. Performed at [facility], [place] [address], [place], [place] [address]   POC occult blood, ED     Status: Abnormal   Collection Time: 07/14/22  5:24 PM  Result Value Ref Range   Fecal Occult Bld POSITIVE (A) NEGATIVE  CBC with Differential/Platelet     Status: Abnormal   Collection Time: 07/15/22 12:20 AM  Result Value Ref Range   WBC 4.1 4.0 - 10.5 K/uL   RBC 2.85 (L) 3.87 - 5.11 MIL/uL   Hemoglobin 7.6 (L) 12.0 - 15.0 g/dL   HCT 22.4 (L) 36.0 - 46.0 %   MCV 78.6 (L) 80.0 - 100.0 fL   MCH 26.7 26.0 - 34.0 pg   MCHC 33.9 30.0 - 36.0 g/dL   RDW 20.7 (H) 11.5 - 15.5 %   Platelets 81 (L) 150 - 400 K/uL    Comment: Immature Platelet Fraction may be clinically indicated, consider ordering this additional test [id number] REPEATED TO VERIFY    nRBC 0.0 0.0 - 0.2 %   Neutrophils Relative % 47 %   Neutro Abs 2.0 1.7 - 7.7 K/uL   Lymphocytes Relative 40 %   Lymphs Abs 1.6 0.7 - 4.0 K/uL   Monocytes Relative 9 %   Monocytes Absolute 0.4 0.1 - 1.0 K/uL   Eosinophils Relative 3 %   Eosinophils Absolute 0.1 0.0 - 0.5 K/uL   Basophils Relative 1 %   Basophils Absolute 0.0 0.0 - 0.1 K/uL   Immature Granulocytes 0 %   Abs Immature Granulocytes 0.01 0.00 - 0.07 K/uL    Comment: Performed at [facility], [place] [address], [place], [place] [address]  Comprehensive metabolic panel     Status: Abnormal   Collection Time: 07/15/22 12:20 AM  Result Value Ref Range   Sodium 133 (L) 135 - 145 mmol/L   Potassium 3.8 3.5 - 5.1  mmol/L   Chloride 107 98 - 111 mmol/L   CO2 21 (L) 22 - 32 mmol/L   Glucose, Bld 121 (H) 70 - 99 mg/dL    Comment: Glucose reference range applies only to samples taken after fasting for at least 8 hours.   BUN 7 6 - 20 mg/dL   Creatinine, Ser 1.16 (H) 0.44 - 1.00 mg/dL   Calcium 8.1 (L) 8.9 - 10.3 mg/dL   Total Protein 7.4 6.5 - 8.1 g/dL   Albumin 1.9 (L) 3.5 - 5.0 g/dL   AST 54 (H) 15 - 41 U/L   ALT 21 0 - 44 U/L   Alkaline Phosphatase 85 38 - 126 U/L   Total Bilirubin 1.8 (H) 0.3 - 1.2 mg/dL   GFR, Estimated 55 (L) >60 mL/min    Comment: (NOTE) Calculated using the CKD-EPI Creatinine Equation (2021)    Anion gap 5 5 - 15    Comment: Performed at [facility], [place] [address], [place], [place] [address]  Magnesium     Status: None   Collection Time: 07/15/22 12:20 AM  Result Value Ref Range  Magnesium 1.7 1.7 - 2.4 mg/dL    Comment: Performed at [facility], [place] [address], [place], [place] [address]  Protime-INR     Status: Abnormal   Collection Time: 07/15/22 12:20 AM  Result Value Ref Range   Prothrombin Time 19.7 (H) 11.4 - 15.2 seconds   INR 1.7 (H) 0.8 - 1.2    Comment: (NOTE) INR goal varies based on device and disease states. Performed at [facility], [place] [address] [place], [place] [address]   Reticulocytes     Status: Abnormal   Collection Time: 07/15/22 12:20 AM  Result Value Ref Range   Retic Ct Pct 3.7 (H) 0.4 - 3.1 %   RBC. 2.79 (L) 3.87 - 5.11 MIL/uL   Retic Count, Absolute 103.8 19.0 - 186.0 K/uL   Immature Retic Fract 24.2 (H) 2.3 - 15.9 %    Comment: Performed at [facility] [address], [place], [place] [address]   CT ANGIO GI BLEED  Result Date: 07/14/2022 CLINICAL DATA:  Follow-up abdominal aortic aneurysm. Vaginal bleeding. EXAM: CTA ABDOMEN AND PELVIS WITHOUT AND WITH CONTRAST TECHNIQUE: Multidetector CT imaging of the abdomen and pelvis was performed using the standard protocol during bolus administration of  intravenous contrast. Multiplanar reconstructed images and MIPs were obtained and reviewed to evaluate the vascular anatomy. RADIATION DOSE REDUCTION: This exam was performed according to the departmental dose-optimization program which includes automated exposure control, adjustment of the mA and/or kV according to patient size and/or use of iterative reconstruction technique. CONTRAST:  195m OMNIPAQUE IOHEXOL 350 MG/ML SOLN COMPARISON:  01/26/2022 FINDINGS: VASCULAR Aorta: Unenhanced images demonstrate no significant vascular calcifications. Surgical clips in the right upper quadrant consistent with cholecystectomy. Surgical clips in the pelvis consistent with tubal ligations. Normal caliber abdominal aorta. No aneurysm or dissection. No significant stenosis. Celiac: Patent without evidence of aneurysm, dissection, vasculitis or significant stenosis. SMA: Patent without evidence of aneurysm, dissection, vasculitis or significant stenosis. Renals: Both renal arteries are patent without evidence of aneurysm, dissection, vasculitis, fibromuscular dysplasia or significant stenosis. IMA: Patent without evidence of aneurysm, dissection, vasculitis or significant stenosis. Inflow: Patent without evidence of aneurysm, dissection, vasculitis or significant stenosis. Proximal Outflow: Bilateral common femoral and visualized portions of the superficial and profunda femoral arteries are patent without evidence of aneurysm, dissection, vasculitis or significant stenosis. Veins: No obvious venous abnormality within the limitations of this arterial phase study. Review of the MIP images confirms the above findings. NON-VASCULAR Lower chest: Trace left pleural effusion with basilar atelectasis. Small esophageal hiatal hernia. Hepatobiliary: No focal liver abnormality is seen. Status post cholecystectomy. No biliary dilatation. Pancreas: Unremarkable. No pancreatic ductal dilatation or surrounding inflammatory changes. Spleen:  Normal in size without focal abnormality. Adrenals/Urinary Tract: Adrenal glands are unremarkable. Kidneys are normal, without renal calculi, focal lesion, or hydronephrosis. Bladder is unremarkable. Stomach/Bowel: Stomach, small bowel, and colon are not abnormally distended. There is evidence of some wall thickening in the colon with areas including the cecum, sigmoid colon, and rectum involved. This may represent inflammatory process such as Crohn disease or other colitis. Diverticulosis of the sigmoid colon without evidence of acute diverticulitis. Appendix is normal. No evidence of any focal area of intraluminal contrast opacification, suggesting no identified areas of active extravasation/gastrointestinal bleeding. Lymphatic: No significant lymphadenopathy. Reproductive: Surgical absence of the uterus. No abnormal adnexal masses. Other: No free air or free fluid in the abdomen. Abdominal wall musculature appears intact. Musculoskeletal: Degenerative changes in the spine. IMPRESSION: VASCULAR Normal  abdominal aorta. No evidence of aneurysm or dissection. No large vessel occlusion or acute abnormality. NON-VASCULAR 1. No evidence of active contrast extravasation to suggest a focal site of gastrointestinal bleeding. 2. Colonic wall thickening involving the rectosigmoid and cecal regions suggesting colitis, possibly Crohn disease. 3. Trace left pleural effusion with basilar atelectasis. Electronically Signed   By: Lucienne Capers M.D.   On: 07/14/2022 21:38   US PELVIC COMPLETE WITH TRANSVAGINAL  Result Date: 07/14/2022 CLINICAL DATA:  [id number] Vaginal bleeding [id number] EXAM: ULTRASOUND OF PELVIS TECHNIQUE: Transabdominal and transvaginalultrasound examination of the pelvis was performed including evaluation of the uterus, ovaries, adnexal regions, and pelvic cul-de-sac. COMPARISON:  None Available. FINDINGS: Patient is status post hysterectomy. Ovaries are not identified. No masses or fluid collections are seen.  IMPRESSION: Status post hysterectomy. No adnexal pathology. Ovaries not identified. Electronically Signed   By: Sammie Bench M.D.   On: 07/14/2022 17:02    Pending Labs Unresulted Labs (From admission, onward)     Start     Ordered   07/15/22 1700  Hemoglobin and hematocrit, blood  5A & 5P,   R (with TIMED occurrences)      07/15/22 1006   07/15/22 0900  Hemoglobin and hematocrit, blood  Once-Timed,   TIMED        07/14/22 2312            Vitals/Pain Today's Vitals   07/15/22 0651 07/15/22 0724 07/15/22 0725 07/15/22 0726  BP:  (!) 99/54 109/62   Pulse:  85 85   Resp:  (!) 21 (!) 23   Temp: 98.4 F (36.9 C)     TempSrc: Oral     SpO2:  100% 100%   Weight:      Height:      PainSc:    0-No pain    Isolation Precautions No active isolations  Medications Medications  acetaminophen (TYLENOL) tablet 650 mg (has no administration in time range)    Or  acetaminophen (TYLENOL) suppository 650 mg (has no administration in time range)  melatonin tablet 3 mg (has no administration in time range)  fluticasone (FLONASE) 50 MCG/ACT nasal spray 1 spray (1 spray Each Nare Given 07/15/22 0912)  pantoprazole (PROTONIX) injection 40 mg (40 mg Intravenous Given 07/15/22 0918)  0.9 %  sodium chloride infusion (has no administration in time range)  iohexol (OMNIPAQUE) 350 MG/ML injection 100 mL (100 mLs Intravenous Contrast Given 07/14/22 2126)  pantoprazole (PROTONIX) injection 40 mg (40 mg Intravenous Given 07/14/22 2250)    Mobility walks     Focused Assessments    R Recommendations: See Admitting Provider Note  Report given to:   Additional Notes:

## 2022-07-15 NOTE — ED Notes (Signed)
RN verified pt is good to be transported to IP care.

## 2022-07-15 NOTE — Plan of Care (Signed)
  Problem: Education: Goal: Knowledge of General Education information will improve Description Including pain rating scale, medication(s)/side effects and non-pharmacologic comfort measures Outcome: Progressing   Problem: Health Behavior/Discharge Planning: Goal: Ability to manage health-related needs will improve Outcome: Progressing   

## 2022-07-15 NOTE — Consult Note (Signed)
Referring Provider: [facility] Primary Care Physician:  Nolene Ebbs, MD Primary Gastroenterologist:  Dr. Therisa Doyne  Reason for Consultation: Rectal bleeding, abnormal CT scan concerning for colitis  HPI: Marissa Warner is a 56 y.o. female with past medical history of hypertension, migraine, chronic kidney disease, chronic anemia, palpitation with previous cardiac workup in 2022 presented to the hospital with nosebleed as well as possible rectal bleeding has history of blood in her underwear.  Rectal exam done by ED physician yesterday showed bright red blood from rectum as well as hemorrhoids.  Blood work showed drop in hemoglobin to 7.5.  Baseline hemoglobin is around 9-10.  Also had low platelet count around 81.  And elevated INR at 1.7.  CT angio GI bleed protocol yesterday showed no evidence of active bleeding but showed wall thickening of the cecum and rectosigmoid colon concerning for Crohn's disease.  Patient seen and examined at bedside.  Patient seeing blood in the underpants she denies any GI symptoms.  She denies any nausea or vomiting.  Denies diarrhea or constipation.  Denies any trouble swallowing or pain while swallowing.  Denies NSAID use.  S/p cholecystectomy as well as total hysterectomy.   Colonoscopy in April 2021 by Dr. Therisa Doyne showed normal TI and normal colon except for diverticulosis.  EGD in 2021 showed gastritis and 2 cm hiatal hernia.  Also had normal colonoscopy in 2016. Past Medical History:  Diagnosis Date   Allergy    environmental and seasonal   Blood transfusion without reported diagnosis before 2004   due to heavy periods   Hepatitis B    Hypertension    Migraines 2007   Renal disorder    Pt. states "Stage III Kidney disease"  Never had kidney biopsy.  She is unable to tell me what the cause of the kidney disease.  Reportedly diagnosed with [facility] in [place].    Past Surgical History:  Procedure Laterality Date   ABDOMINAL HYSTERECTOMY  2004    With BSO, she thinks   BIOPSY  09/09/2019   Procedure: BIOPSY;  Surgeon: Ronnette Juniper, MD;  Location: [facility];  Service: Gastroenterology;;   CHOLECYSTECTOMY  uncertain date   Laparoscopic   COLONOSCOPY WITH PROPOFOL N/A 09/09/2019   Procedure: COLONOSCOPY WITH PROPOFOL;  Surgeon: Ronnette Juniper, MD;  Location: [facility];  Service: Gastroenterology;  Laterality: N/A;   ESOPHAGOGASTRODUODENOSCOPY (EGD) WITH PROPOFOL N/A 09/09/2019   Procedure: ESOPHAGOGASTRODUODENOSCOPY (EGD) WITH PROPOFOL;  Surgeon: Ronnette Juniper, MD;  Location: [facility];  Service: Gastroenterology;  Laterality: N/A;   LAPAROSCOPIC INCISIONAL / UMBILICAL / VENTRAL HERNIA REPAIR  2000    Prior to Admission medications   Medication Sig Start Date End Date Taking? Authorizing Provider  acetaminophen (TYLENOL) 500 MG tablet Take 500 mg by mouth every 6 (six) hours as needed for mild pain.   Yes [provider]  carvedilol (COREG) 6.25 MG tablet Take 1 tablet (6.25 mg total) by mouth 2 (two) times daily. 07/02/21  Yes Donato Heinz, MD  diclofenac Sodium (VOLTAREN) 1 % GEL Apply 4 g topically 4 (four) times daily. Patient taking differently: Apply 4 g topically 4 (four) times daily as needed (Pain). 06/03/19  Yes Mack Hook, MD  fluticasone (FLONASE) 50 MCG/ACT nasal spray Place 1 spray into both nostrils daily.   Yes [provider]  losartan (COZAAR) 25 MG tablet Take 25 mg by mouth daily. 07/11/22  Yes [provider]  Vitamin D, Ergocalciferol, (DRISDOL) 1.25 MG (50000 UNIT) CAPS capsule Take 50,000 Units by  mouth every 7 (seven) days.   Yes [provider]  prochlorperazine (COMPAZINE) 10 MG tablet Take 1 tablet (10 mg total) by mouth 2 (two) times daily as needed for nausea or vomiting. Patient not taking: Reported on 07/15/2022 01/26/22   Mesner, Corene Cornea, MD  prochlorperazine (COMPAZINE) 25 MG suppository Place 1 suppository (25 mg total) rectally every 12 (twelve) hours as  needed for nausea or vomiting. Patient not taking: Reported on 07/15/2022 01/26/22   Mesner, Corene Cornea, MD    Scheduled Meds:  fluticasone  1 spray Each Nare Daily   pantoprazole (PROTONIX) IV  40 mg Intravenous Q24H   Continuous Infusions:  sodium chloride     PRN Meds:.acetaminophen **OR** acetaminophen, melatonin  Allergies as of 07/14/2022   (No Known Allergies)    History reviewed. No pertinent family history.  Social History   Socioeconomic History   Marital status: Single    Spouse name: Not on file   Number of children: 4   Years of education: 12   Highest education level: Not on file  Occupational History   Occupation: unemployed    Comment: Previously in housekeeping  Tobacco Use   Smoking status: Never   Smokeless tobacco: Current    Types: Chew   Tobacco comments:    down to once daily or every other day:  8/19  Substance and Sexual Activity   Alcohol use: No   Drug use: No   Sexual activity: Never    Partners: Male  Other Topics Concern   Not on file  Social History Narrative   Originally from [place], [place] far from [place], ([place])   Moved to [place] in March 2017 to be near daughter.   Lives alone near [place].   Social Determinants of Health   Financial Resource Strain: Not on file  Food Insecurity: Not on file  Transportation Needs: Not on file  Physical Activity: Not on file  Stress: Not on file  Social Connections: Not on file  Intimate Partner Violence: Not on file    Review of Systems: All negative except as stated above in HPI.  Physical Exam: Vital signs: Vitals:   07/15/22 0724 07/15/22 0725  BP: (!) 99/54 109/62  Pulse: 85 85  Resp: (!) 21 (!) 23  Temp:    SpO2: 100% 100%     General:   Alert,  Well-developed, well-nourished, pleasant and cooperative in NAD H EENT: Normocephalic, extraocular movement intact, Lungs: No visible respiratory distress Heart:  Regular rate and rhythm; no murmurs, clicks, rubs,   or gallops. Abdomen: Soft, nontender, nondistended, bowel sounds present, no peritoneal signs Alert and oriented x 3 Mood and affect normal Rectal:  Deferred  GI:  Lab Results: Recent Labs    07/14/22 1310 07/15/22 0020  WBC 3.7* 4.1  HGB 7.5* 7.6*  HCT 22.7* 22.4*  PLT 90* 81*   BMET Recent Labs    07/14/22 1309 07/15/22 0020  NA 135 133*  K 3.7 3.8  CL 106 107  CO2 24 21*  GLUCOSE 134* 121*  BUN 8 7  CREATININE 1.27* 1.16*  CALCIUM 8.5* 8.1*   LFT Recent Labs    07/15/22 0020  PROT 7.4  ALBUMIN 1.9*  AST 54*  ALT 21  ALKPHOS 85  BILITOT 1.8*   PT/INR Recent Labs    07/14/22 1714 07/15/22 0020  LABPROT 18.7* 19.7*  INR 1.6* 1.7*     Studies/Results: CT ANGIO GI BLEED  Result Date: 07/14/2022 CLINICAL DATA:  Follow-up  abdominal aortic aneurysm. Vaginal bleeding. EXAM: CTA ABDOMEN AND PELVIS WITHOUT AND WITH CONTRAST TECHNIQUE: Multidetector CT imaging of the abdomen and pelvis was performed using the standard protocol during bolus administration of intravenous contrast. Multiplanar reconstructed images and MIPs were obtained and reviewed to evaluate the vascular anatomy. RADIATION DOSE REDUCTION: This exam was performed according to the departmental dose-optimization program which includes automated exposure control, adjustment of the mA and/or kV according to patient size and/or use of iterative reconstruction technique. CONTRAST:  158m OMNIPAQUE IOHEXOL 350 MG/ML SOLN COMPARISON:  01/26/2022 FINDINGS: VASCULAR Aorta: Unenhanced images demonstrate no significant vascular calcifications. Surgical clips in the right upper quadrant consistent with cholecystectomy. Surgical clips in the pelvis consistent with tubal ligations. Normal caliber abdominal aorta. No aneurysm or dissection. No significant stenosis. Celiac: Patent without evidence of aneurysm, dissection, vasculitis or significant stenosis. SMA: Patent without evidence of aneurysm, dissection, vasculitis  or significant stenosis. Renals: Both renal arteries are patent without evidence of aneurysm, dissection, vasculitis, fibromuscular dysplasia or significant stenosis. IMA: Patent without evidence of aneurysm, dissection, vasculitis or significant stenosis. Inflow: Patent without evidence of aneurysm, dissection, vasculitis or significant stenosis. Proximal Outflow: Bilateral common femoral and visualized portions of the superficial and profunda femoral arteries are patent without evidence of aneurysm, dissection, vasculitis or significant stenosis. Veins: No obvious venous abnormality within the limitations of this arterial phase study. Review of the MIP images confirms the above findings. NON-VASCULAR Lower chest: Trace left pleural effusion with basilar atelectasis. Small esophageal hiatal hernia. Hepatobiliary: No focal liver abnormality is seen. Status post cholecystectomy. No biliary dilatation. Pancreas: Unremarkable. No pancreatic ductal dilatation or surrounding inflammatory changes. Spleen: Normal in size without focal abnormality. Adrenals/Urinary Tract: Adrenal glands are unremarkable. Kidneys are normal, without renal calculi, focal lesion, or hydronephrosis. Bladder is unremarkable. Stomach/Bowel: Stomach, small bowel, and colon are not abnormally distended. There is evidence of some wall thickening in the colon with areas including the cecum, sigmoid colon, and rectum involved. This may represent inflammatory process such as Crohn disease or other colitis. Diverticulosis of the sigmoid colon without evidence of acute diverticulitis. Appendix is normal. No evidence of any focal area of intraluminal contrast opacification, suggesting no identified areas of active extravasation/gastrointestinal bleeding. Lymphatic: No significant lymphadenopathy. Reproductive: Surgical absence of the uterus. No abnormal adnexal masses. Other: No free air or free fluid in the abdomen. Abdominal wall musculature appears  intact. Musculoskeletal: Degenerative changes in the spine. IMPRESSION: VASCULAR Normal abdominal aorta. No evidence of aneurysm or dissection. No large vessel occlusion or acute abnormality. NON-VASCULAR 1. No evidence of active contrast extravasation to suggest a focal site of gastrointestinal bleeding. 2. Colonic wall thickening involving the rectosigmoid and cecal regions suggesting colitis, possibly Crohn disease. 3. Trace left pleural effusion with basilar atelectasis. Electronically Signed   By: WLucienne CapersM.D.   On: 07/14/2022 21:38   UKoreaPELVIC COMPLETE WITH TRANSVAGINAL  Result Date: 07/14/2022 CLINICAL DATA:  [id number] bleeding [id number]: ULTRASOUND OF PELVIS TECHNIQUE: Transabdominal and transvaginalultrasound examination of the pelvis was performed including evaluation of the uterus, ovaries, adnexal regions, and pelvic cul-de-sac. COMPARISON:  None Available. FINDINGS: Patient is status post hysterectomy. Ovaries are not identified. No masses or fluid collections are seen. IMPRESSION: Status post hysterectomy. No adnexal pathology. Ovaries not identified. Electronically Signed   By: JSammie BenchM.D.   On: 07/14/2022 17:02    Impression/Plan: -Rectal bleeding with a CT scan showing questionable wall thickening of the cecum as well as rectosigmoid area.  Patient had  a negative colonoscopy and 2021 and in 2016.  No abdominal pain or diarrhea.  Less likely to have Crohn's disease. -Abnormal LFTs with low platelet count and elevated INR.   history of hepatitis B.  Need to rule out cirrhosis.  Patient CT did not showed any cirrhosis.  Her CT finding of mild thickening of the cecum and rectosigmoid could be from trace ascites -Chronic hepatitis B.  Blood work in September 2023 showed positive hepatitis B surface antigen as well as positive hepatitis B DNA.  Not sure if she is on any treatment.  Recommendations -------------------------- -Ultrasound abdomen complete to rule out  cirrhosis as well as ascites. -Monitor H&H.  Transfuse if hemoglobin less than 7.  No further bleeding episodes. -May need outpatient treatment for chronic hepatitis B. -EGD in 2021 not showed any evidence of varices. -Okay to have clear liquid diet today.  Dr. Therisa Doyne will follow-up on the weekends.     LOS: 0 days   Otis Brace  MD, FACP 07/15/2022, 9:59 AM  Contact #  [phone]

## 2022-07-15 NOTE — ED Notes (Signed)
Attending at bedside.

## 2022-07-15 NOTE — Progress Notes (Signed)
PROGRESS NOTE    Marissa Warner  [id number] DOB: [birth year] DOA: 07/14/2022 PCP: Nolene Ebbs, MD    Brief Narrative:  57 year old with history of essential hypertension, chronic Andersons anemia with baseline hemoglobin 9.5-10, GERD.  Came to the emergency room with 2 large painless rectal bleeding from home.  Hemodynamically stable in the ER.  Hemoglobin 7.4.  Admitted with GI consultation.   Assessment & Plan:   Painless rectal bleeding, likely diverticular bleeding. EGD 08/2019, normal findings Colonoscopy 08/2019, diverticulosis. Likely diverticular bleed.  Bleeding scan was negative.  Currently hemodynamically stable. Maintenance IV fluids, start clear liquid diet.  Patient will likely need repeat colonoscopy due to large amount of fresh bleeding and drop in hemoglobin by 2 points. [facility] to follow. Patient is every 12 hours.  Transfuse for less than 7 or for any ongoing bleeding.  Patient's hypertension, blood pressure stable.  Holding antihypertensives including lisinopril.  Resume Coreg to avoid rebound tachycardia.  GERD: On PPI.  On IV PPI today.   DVT prophylaxis: SCDs Start: 07/14/22 2310   Code Status: Full code Family Communication: None at the bedside Disposition Plan: Status is: Observation The patient will require care spanning > 2 midnights and should be moved to inpatient because: Will likely need inpatient procedures     Consultants:  Gastroenterology  Procedures:  None  Antimicrobials:  None   Subjective: Patient seen and examined.  Remains in the emergency room.  Currently without any symptoms.  Last bowel movement was before coming to the hospital that was bright red blood.  Denies any nausea vomiting or abdominal pain or cramping.  Eager to eat something. Last hemoglobin 7.6.  Repeat pending.  Objective: Vitals:   07/15/22 0600 07/15/22 0651 07/15/22 0724 07/15/22 0725  BP: (!) 101/55  (!) 99/54 109/62  Pulse: 80  85 85  Resp: (!) 22   (!) 21 (!) 23  Temp:  98.4 F (36.9 C)    TempSrc:  Oral    SpO2: 99%  100% 100%  Weight:      Height:       No intake or output data in the 24 hours ending 07/15/22 1006 Filed Weights   07/14/22 1253  Weight: 87.7 kg    Examination:  General exam: Appears calm and comfortable  Respiratory system: Clear to auscultation. Respiratory effort normal. Cardiovascular system: S1 & S2 heard, RRR. No JVD, murmurs, rubs, gallops or clicks. No pedal edema. Gastrointestinal system: Abdomen is nondistended, soft and nontender. No organomegaly or masses felt. Normal bowel sounds heard. Central nervous system: Alert and oriented. No focal neurological deficits. Extremities: Symmetric 5 x 5 power. Skin: No rashes, lesions or ulcers Psychiatry: Judgement and insight appear normal. Mood & affect appropriate.     Data Reviewed: I have personally reviewed following labs and imaging studies  CBC: Recent Labs  Lab 07/14/22 1310 07/15/22 0020  WBC 3.7* 4.1  NEUTROABS 2.3 2.0  HGB 7.5* 7.6*  HCT 22.7* 22.4*  MCV 79.9* 78.6*  PLT 90* 81*   Basic Metabolic Panel: Recent Labs  Lab 07/14/22 1309 07/15/22 0020  NA 135 133*  K 3.7 3.8  CL 106 107  CO2 24 21*  GLUCOSE 134* 121*  BUN 8 7  CREATININE 1.27* 1.16*  CALCIUM 8.5* 8.1*  MG 1.8 1.7   GFR: Estimated Creatinine Clearance: 57.8 mL/min (A) (by C-G formula based on SCr of 1.16 mg/dL (H)). Liver Function Tests: Recent Labs  Lab 07/14/22 1309 07/15/22 0020  AST 58* 54*  ALT 25 21  ALKPHOS 80 85  BILITOT 2.0* 1.8*  PROT 7.5 7.4  ALBUMIN 2.0* 1.9*   No results for input(s): "LIPASE", "AMYLASE" in the last 168 hours. No results for input(s): "AMMONIA" in the last 168 hours. Coagulation Profile: Recent Labs  Lab 07/14/22 1714 07/15/22 0020  INR 1.6* 1.7*   Cardiac Enzymes: No results for input(s): "CKTOTAL", "CKMB", "CKMBINDEX", "TROPONINI" in the last 168 hours. BNP (last 3 results) No results for input(s): "PROBNP"  in the last 8760 hours. HbA1C: No results for input(s): "HGBA1C" in the last 72 hours. CBG: No results for input(s): "GLUCAP" in the last 168 hours. Lipid Profile: No results for input(s): "CHOL", "HDL", "LDLCALC", "TRIG", "CHOLHDL", "LDLDIRECT" in the last 72 hours. Thyroid Function Tests: No results for input(s): "TSH", "T4TOTAL", "FREET4", "T3FREE", "THYROIDAB" in the last 72 hours. Anemia Panel: Recent Labs    07/14/22 1309 07/15/22 0020  FERRITIN 555*  --   TIBC 172*  --   IRON 140  --   RETICCTPCT  --  3.7*   Sepsis Labs: No results for input(s): "PROCALCITON", "LATICACIDVEN" in the last 168 hours.  No results found for this or any previous visit (from the past 240 hour(s)).       Radiology Studies: CT ANGIO GI BLEED  Result Date: 07/14/2022 CLINICAL DATA:  Follow-up abdominal aortic aneurysm. Vaginal bleeding. EXAM: CTA ABDOMEN AND PELVIS WITHOUT AND WITH CONTRAST TECHNIQUE: Multidetector CT imaging of the abdomen and pelvis was performed using the standard protocol during bolus administration of intravenous contrast. Multiplanar reconstructed images and MIPs were obtained and reviewed to evaluate the vascular anatomy. RADIATION DOSE REDUCTION: This exam was performed according to the departmental dose-optimization program which includes automated exposure control, adjustment of the mA and/or kV according to patient size and/or use of iterative reconstruction technique. CONTRAST:  181m OMNIPAQUE IOHEXOL 350 MG/ML SOLN COMPARISON:  01/26/2022 FINDINGS: VASCULAR Aorta: Unenhanced images demonstrate no significant vascular calcifications. Surgical clips in the right upper quadrant consistent with cholecystectomy. Surgical clips in the pelvis consistent with tubal ligations. Normal caliber abdominal aorta. No aneurysm or dissection. No significant stenosis. Celiac: Patent without evidence of aneurysm, dissection, vasculitis or significant stenosis. SMA: Patent without evidence of  aneurysm, dissection, vasculitis or significant stenosis. Renals: Both renal arteries are patent without evidence of aneurysm, dissection, vasculitis, fibromuscular dysplasia or significant stenosis. IMA: Patent without evidence of aneurysm, dissection, vasculitis or significant stenosis. Inflow: Patent without evidence of aneurysm, dissection, vasculitis or significant stenosis. Proximal Outflow: Bilateral common femoral and visualized portions of the superficial and profunda femoral arteries are patent without evidence of aneurysm, dissection, vasculitis or significant stenosis. Veins: No obvious venous abnormality within the limitations of this arterial phase study. Review of the MIP images confirms the above findings. NON-VASCULAR Lower chest: Trace left pleural effusion with basilar atelectasis. Small esophageal hiatal hernia. Hepatobiliary: No focal liver abnormality is seen. Status post cholecystectomy. No biliary dilatation. Pancreas: Unremarkable. No pancreatic ductal dilatation or surrounding inflammatory changes. Spleen: Normal in size without focal abnormality. Adrenals/Urinary Tract: Adrenal glands are unremarkable. Kidneys are normal, without renal calculi, focal lesion, or hydronephrosis. Bladder is unremarkable. Stomach/Bowel: Stomach, small bowel, and colon are not abnormally distended. There is evidence of some wall thickening in the colon with areas including the cecum, sigmoid colon, and rectum involved. This may represent inflammatory process such as Crohn disease or other colitis. Diverticulosis of the sigmoid colon without evidence of acute diverticulitis. Appendix is normal. No evidence of any focal area of intraluminal contrast  opacification, suggesting no identified areas of active extravasation/gastrointestinal bleeding. Lymphatic: No significant lymphadenopathy. Reproductive: Surgical absence of the uterus. No abnormal adnexal masses. Other: No free air or free fluid in the abdomen.  Abdominal wall musculature appears intact. Musculoskeletal: Degenerative changes in the spine. IMPRESSION: VASCULAR Normal abdominal aorta. No evidence of aneurysm or dissection. No large vessel occlusion or acute abnormality. NON-VASCULAR 1. No evidence of active contrast extravasation to suggest a focal site of gastrointestinal bleeding. 2. Colonic wall thickening involving the rectosigmoid and cecal regions suggesting colitis, possibly Crohn disease. 3. Trace left pleural effusion with basilar atelectasis. Electronically Signed   By: Lucienne Capers M.D.   On: 07/14/2022 21:38   US PELVIC COMPLETE WITH TRANSVAGINAL  Result Date: 07/14/2022 CLINICAL DATA:  [id number] Vaginal bleeding [id number] EXAM: ULTRASOUND OF PELVIS TECHNIQUE: Transabdominal and transvaginalultrasound examination of the pelvis was performed including evaluation of the uterus, ovaries, adnexal regions, and pelvic cul-de-sac. COMPARISON:  None Available. FINDINGS: Patient is status post hysterectomy. Ovaries are not identified. No masses or fluid collections are seen. IMPRESSION: Status post hysterectomy. No adnexal pathology. Ovaries not identified. Electronically Signed   By: Sammie Bench M.D.   On: 07/14/2022 17:02        Scheduled Meds:  fluticasone  1 spray Each Nare Daily   pantoprazole (PROTONIX) IV  40 mg Intravenous Q24H   Continuous Infusions:  sodium chloride       LOS: 0 days    Time spent: 35 minutes    Barb Merino, MD [facility] Pager [phone]

## 2022-07-15 NOTE — ED Notes (Signed)
Pt ambulated to restroom with steady gait. No incidents occurred.

## 2022-07-16 ENCOUNTER — Inpatient Hospital Stay (HOSPITAL_COMMUNITY): Payer: Medicaid Other

## 2022-07-16 DIAGNOSIS — K625 Hemorrhage of anus and rectum: Secondary | ICD-10-CM

## 2022-07-16 LAB — HEMOGLOBIN AND HEMATOCRIT, BLOOD
HCT: 22.9 % — ABNORMAL LOW (ref 36.0–46.0)
HCT: 25.4 % — ABNORMAL LOW (ref 36.0–46.0)
Hemoglobin: 7.9 g/dL — ABNORMAL LOW (ref 12.0–15.0)
Hemoglobin: 8.9 g/dL — ABNORMAL LOW (ref 12.0–15.0)

## 2022-07-16 LAB — CBC
HCT: 18.8 % — ABNORMAL LOW (ref 36.0–46.0)
Hemoglobin: 6.6 g/dL — CL (ref 12.0–15.0)
MCH: 26.8 pg (ref 26.0–34.0)
MCHC: 35.1 g/dL (ref 30.0–36.0)
MCV: 76.4 fL — ABNORMAL LOW (ref 80.0–100.0)
Platelets: 75 10*3/uL — ABNORMAL LOW (ref 150–400)
RBC: 2.46 MIL/uL — ABNORMAL LOW (ref 3.87–5.11)
RDW: 20 % — ABNORMAL HIGH (ref 11.5–15.5)
WBC: 3.1 10*3/uL — ABNORMAL LOW (ref 4.0–10.5)
nRBC: 0 % (ref 0.0–0.2)

## 2022-07-16 LAB — PROTIME-INR
INR: 1.7 — ABNORMAL HIGH (ref 0.8–1.2)
Prothrombin Time: 20.2 seconds — ABNORMAL HIGH (ref 11.4–15.2)

## 2022-07-16 LAB — PREPARE RBC (CROSSMATCH)

## 2022-07-16 MED ORDER — PEG 3350-KCL-NA BICARB-NACL 420 G PO SOLR
4000.0000 mL | Freq: Once | ORAL | Status: DC
  Filled 2022-07-16: qty 4000

## 2022-07-16 MED ORDER — SODIUM CHLORIDE 0.9% IV SOLUTION: Freq: Once | INTRAVENOUS | Status: AC

## 2022-07-16 MED ORDER — GADOBUTROL 1 MMOL/ML IV SOLN
9.0000 mL | Freq: Once | INTRAVENOUS | Status: AC | PRN
  Administered 2022-07-16: 9 mL via INTRAVENOUS

## 2022-07-16 MED ORDER — PEG 3350-KCL-NA BICARB-NACL 420 G PO SOLR
4000.0000 mL | Freq: Once | ORAL | Status: AC
  Administered 2022-07-17: 4000 mL via ORAL
  Filled 2022-07-16: qty 4000

## 2022-07-16 NOTE — Progress Notes (Signed)
[facility] PROGRESS NOTE    Progress Note  Marissa Warner  [id number] DOB: 08/06/1965 DOA: 07/14/2022 PCP: Nolene Ebbs, MD     Brief Narrative:   Marissa Warner is an 57 y.o. female past medical history of essential hypertension, anemia came into the emergency room for painless rectal bleeding GI was consulted   Assessment/Plan:   Painless Acute lower GI bleeding: Likely diverticular bleed, EGD showed no acute findings. Bleeding scan was negative. Patient is currently hemodynamically stable and a clear liquid diet. GI was consulted recommended abdominal ultrasound to rule out cirrhosis, they recommended to start a clear liquid diet and follow-up with them as an outpatient. Hemoglobin this morning 6.6 transfusion 2 units of packed red blood cells recheck Hbg posttransfusional.  Essential hypertension: Currently stable holding anti-HTN  medication: Lisinopril, continue Coreg.  Cirrhosis of the liver: Confirmed by ultrasound follow-up with GI as an outpatient. She will need non-contrast MRI of the abdomen to evaluate 1.9 hyperechoic mass in the right hepatic lobe.  Chronic hepatitis B: Follow-up with GI as an outpatient.  DVT prophylaxis: scd Family Communication:none Status is: Inpatient Remains inpatient appropriate because: Acute GI bleed    Code Status:     Code Status Orders  (From admission, onward)           Start     Ordered   07/14/22 2311  Full code  Continuous       Question:  By:  Answer:  Consent: discussion documented in EHR   07/14/22 2310           Code Status History     Date Active Date Inactive Code Status Order ID Comments User Context   10/04/2020 0510 10/04/2020 2159 Full Code [id number]  Jaci Lazier, MD ED         IV Access:   Peripheral IV   Procedures and diagnostic studies:   US Abdomen Complete  Result Date: 07/15/2022 CLINICAL DATA:  Cirrhosis EXAM: ABDOMEN ULTRASOUND COMPLETE COMPARISON:  CT  07/14/2022 FINDINGS: Gallbladder: Surgically absent. Common bile duct: Diameter: 2.5 mm, normal. No intrahepatic ductal dilation. Liver: There is a 1.7 x 1.5 x 1.9 cm hypoechoic mass in the right hepatic lobe. Questionable internal vascularity. Mildly nodular liver contours. Portal vein is patent on color Doppler imaging with normal direction of blood flow towards the liver. IVC: No abnormality visualized. Pancreas: Visualized portion unremarkable. Spleen: Size and appearance within normal limits. Right Kidney: Length: 10.0 cm. Echogenicity within normal limits. No mass or hydronephrosis visualized. Left Kidney: Length: 10.4 cm. Echogenicity is within normal limits. No hydronephrosis. There is a 1.4 cm cyst in the upper pole which is better evaluated on prior CTA of the abdomen and is likely a simple cyst. Prominent column of Bertin Abdominal aorta: No aneurysm visualized. Other findings: None. IMPRESSION: 1.9 cm hypoechoic mass in right hepatic lobe. Mildly nodular liver contours, which could suggest underlying chronic liver disease/possible cirrhosis. Recommend non-emergent MRI with and without contrast as an outpatient to assess the right hepatic lobe mass, when the patient is stable and able to follow breathing instructions. Electronically Signed   By: Maurine Simmering M.D.   On: 07/15/2022 16:44   CT ANGIO GI BLEED  Result Date: 07/14/2022 CLINICAL DATA:  Follow-up abdominal aortic aneurysm. Vaginal bleeding. EXAM: CTA ABDOMEN AND PELVIS WITHOUT AND WITH CONTRAST TECHNIQUE: Multidetector CT imaging of the abdomen and pelvis was performed using the standard protocol during bolus administration of intravenous contrast. Multiplanar reconstructed images and MIPs were obtained and  reviewed to evaluate the vascular anatomy. RADIATION DOSE REDUCTION: This exam was performed according to the departmental dose-optimization program which includes automated exposure control, adjustment of the mA and/or kV according to  patient size and/or use of iterative reconstruction technique. CONTRAST:  147m OMNIPAQUE IOHEXOL 350 MG/ML SOLN COMPARISON:  01/26/2022 FINDINGS: VASCULAR Aorta: Unenhanced images demonstrate no significant vascular calcifications. Surgical clips in the right upper quadrant consistent with cholecystectomy. Surgical clips in the pelvis consistent with tubal ligations. Normal caliber abdominal aorta. No aneurysm or dissection. No significant stenosis. Celiac: Patent without evidence of aneurysm, dissection, vasculitis or significant stenosis. SMA: Patent without evidence of aneurysm, dissection, vasculitis or significant stenosis. Renals: Both renal arteries are patent without evidence of aneurysm, dissection, vasculitis, fibromuscular dysplasia or significant stenosis. IMA: Patent without evidence of aneurysm, dissection, vasculitis or significant stenosis. Inflow: Patent without evidence of aneurysm, dissection, vasculitis or significant stenosis. Proximal Outflow: Bilateral common femoral and visualized portions of the superficial and profunda femoral arteries are patent without evidence of aneurysm, dissection, vasculitis or significant stenosis. Veins: No obvious venous abnormality within the limitations of this arterial phase study. Review of the MIP images confirms the above findings. NON-VASCULAR Lower chest: Trace left pleural effusion with basilar atelectasis. Small esophageal hiatal hernia. Hepatobiliary: No focal liver abnormality is seen. Status post cholecystectomy. No biliary dilatation. Pancreas: Unremarkable. No pancreatic ductal dilatation or surrounding inflammatory changes. Spleen: Normal in size without focal abnormality. Adrenals/Urinary Tract: Adrenal glands are unremarkable. Kidneys are normal, without renal calculi, focal lesion, or hydronephrosis. Bladder is unremarkable. Stomach/Bowel: Stomach, small bowel, and colon are not abnormally distended. There is evidence of some wall thickening in  the colon with areas including the cecum, sigmoid colon, and rectum involved. This may represent inflammatory process such as Crohn disease or other colitis. Diverticulosis of the sigmoid colon without evidence of acute diverticulitis. Appendix is normal. No evidence of any focal area of intraluminal contrast opacification, suggesting no identified areas of active extravasation/gastrointestinal bleeding. Lymphatic: No significant lymphadenopathy. Reproductive: Surgical absence of the uterus. No abnormal adnexal masses. Other: No free air or free fluid in the abdomen. Abdominal wall musculature appears intact. Musculoskeletal: Degenerative changes in the spine. IMPRESSION: VASCULAR Normal abdominal aorta. No evidence of aneurysm or dissection. No large vessel occlusion or acute abnormality. NON-VASCULAR 1. No evidence of active contrast extravasation to suggest a focal site of gastrointestinal bleeding. 2. Colonic wall thickening involving the rectosigmoid and cecal regions suggesting colitis, possibly Crohn disease. 3. Trace left pleural effusion with basilar atelectasis. Electronically Signed   By: WLucienne CapersM.D.   On: 07/14/2022 21:38   [place]PELVIC COMPLETE WITH TRANSVAGINAL  Result Date: 07/14/2022 CLINICAL DATA:  [id number] bleeding [id number]: ULTRASOUND OF PELVIS TECHNIQUE: Transabdominal and transvaginalultrasound examination of the pelvis was performed including evaluation of the uterus, ovaries, adnexal regions, and pelvic cul-de-sac. COMPARISON:  None Available. FINDINGS: Patient is status post hysterectomy. Ovaries are not identified. No masses or fluid collections are seen. IMPRESSION: Status post hysterectomy. No adnexal pathology. Ovaries not identified. Electronically Signed   By: JSammie BenchM.D.   On: 07/14/2022 17:02     Medical Consultants:   None.   Subjective:    YYsabelle Weddingno further bleeding she relates she is hungry  Objective:    Vitals:   07/16/22 0500  07/16/22 0733 07/16/22 0823 07/16/22 0850  BP:  (!) 96/54 95/73 (!) 101/49  Pulse:  74 74 79  Resp:  '16 16 17  '$ Temp:  (!) 97.5 F (  36.4 C) 98.4 F (36.9 C) 98.4 F (36.9 C)  TempSrc:  Oral Oral   SpO2:  100% 100% 100%  Weight: 82.7 kg     Height:       SpO2: 100 %  No intake or output data in the 24 hours ending 07/16/22 0853 Filed Weights   07/14/22 1253 07/16/22 0500  Weight: 87.7 kg 82.7 kg    Exam: General exam: In no acute distress. Respiratory system: Good air movement and clear to auscultation. Cardiovascular system: S1 & S2 heard, RRR. No JVD.  Gastrointestinal system: Abdomen is nondistended, soft and nontender.  Extremities: No pedal edema. Skin: No rashes, lesions or ulcers Psychiatry: Judgement and insight appear normal. Mood & affect appropriate.    Data Reviewed:    Labs: Basic Metabolic Panel: Recent Labs  Lab 07/14/22 1309 07/15/22 0020  NA 135 133*  K 3.7 3.8  CL 106 107  CO2 24 21*  GLUCOSE 134* 121*  BUN 8 7  CREATININE 1.27* 1.16*  CALCIUM 8.5* 8.1*  MG 1.8 1.7   GFR Estimated Creatinine Clearance: 56.2 mL/min (A) (by C-G formula based on SCr of 1.16 mg/dL (H)). Liver Function Tests: Recent Labs  Lab 07/14/22 1309 07/15/22 0020  AST 58* 54*  ALT 25 21  ALKPHOS 80 85  BILITOT 2.0* 1.8*  PROT 7.5 7.4  ALBUMIN 2.0* 1.9*   No results for input(s): "LIPASE", "AMYLASE" in the last 168 hours. No results for input(s): "AMMONIA" in the last 168 hours. Coagulation profile Recent Labs  Lab 07/14/22 1714 07/15/22 0020 07/16/22 0627  INR 1.6* 1.7* 1.7*   COVID-19 Labs  Recent Labs    07/14/22 1309  FERRITIN 555*    Lab Results  Component Value Date   SARSCOV2NAA NEGATIVE 10/04/2020   [place] NEGATIVE 09/05/2019    CBC: Recent Labs  Lab 07/14/22 1310 07/15/22 0020 07/15/22 1650 07/16/22 0627  WBC 3.7* 4.1  --  3.1*  NEUTROABS 2.3 2.0  --   --   HGB 7.5* 7.6* 7.6* 6.6*  HCT 22.7* 22.4* 22.5* 18.8*  MCV 79.9*  78.6*  --  76.4*  PLT 90* 81*  --  75*   Cardiac Enzymes: No results for input(s): "CKTOTAL", "CKMB", "CKMBINDEX", "TROPONINI" in the last 168 hours. BNP (last 3 results) No results for input(s): "PROBNP" in the last 8760 hours. CBG: No results for input(s): "GLUCAP" in the last 168 hours. D-Dimer: No results for input(s): "DDIMER" in the last 72 hours. Hgb A1c: No results for input(s): "HGBA1C" in the last 72 hours. Lipid Profile: No results for input(s): "CHOL", "HDL", "LDLCALC", "TRIG", "CHOLHDL", "LDLDIRECT" in the last 72 hours. Thyroid function studies: No results for input(s): "TSH", "T4TOTAL", "T3FREE", "THYROIDAB" in the last 72 hours.  Invalid input(s): "FREET3" Anemia work up: Recent Labs    07/14/22 1309 07/15/22 0020  FERRITIN 555*  --   TIBC 172*  --   IRON 140  --   RETICCTPCT  --  3.7*   Sepsis Labs: Recent Labs  Lab 07/14/22 1310 07/15/22 0020 07/16/22 0627  WBC 3.7* 4.1 3.1*   Microbiology No results found for this or any previous visit (from the past 240 hour(s)).   Medications:    sodium chloride   Intravenous Once   carvedilol  6.25 mg Oral BID WC   fluticasone  1 spray Each Nare Daily   pantoprazole (PROTONIX) IV  40 mg Intravenous Q24H   Continuous Infusions:  sodium chloride 100 mL/hr at 07/15/22 1842  LOS: 1 day   Marissa Warner  [facility]  07/16/2022, 8:53 AM

## 2022-07-16 NOTE — Progress Notes (Signed)
Subjective: Patient states she had 2 episodes of rectal bleeding on Thursday, 2 days ago and 1 episode of nosebleed on Wednesday 3 days ago, no further bleeding episodes since.  Objective: Vital signs in last 24 hours: Temp:  [97.5 F (36.4 C)-98.5 F (36.9 C)] 98.4 F (36.9 C) (02/24 0850) Pulse Rate:  [74-90] 79 (02/24 0850) Resp:  [16-18] 17 (02/24 0850) BP: (95-116)/(49-73) 101/49 (02/24 0850) SpO2:  [100 %] 100 % (02/24 0850) Weight:  [82.7 kg] 82.7 kg (02/24 0500) Weight change: -5 kg Last BM Date : 07/14/22  PE: Lying comfortably on bed GENERAL: Prominent pallor  ABDOMEN: Nondistended, nontender EXTREMITIES: No deformity  Lab Results: Results for orders placed or performed during the hospital encounter of 07/14/22 (from the past 48 hour(s))  Comprehensive metabolic panel     Status: Abnormal   Collection Time: 07/14/22  1:09 PM  Result Value Ref Range   Sodium 135 135 - 145 mmol/L   Potassium 3.7 3.5 - 5.1 mmol/L   Chloride 106 98 - 111 mmol/L   CO2 24 22 - 32 mmol/L   Glucose, Bld 134 (H) 70 - 99 mg/dL    Comment: Glucose reference range applies only to samples taken after fasting for at least 8 hours.   BUN 8 6 - 20 mg/dL   Creatinine, Ser 1.27 (H) 0.44 - 1.00 mg/dL   Calcium 8.5 (L) 8.9 - 10.3 mg/dL   Total Protein 7.5 6.5 - 8.1 g/dL   Albumin 2.0 (L) 3.5 - 5.0 g/dL   AST 58 (H) 15 - 41 U/L   ALT 25 0 - 44 U/L   Alkaline Phosphatase 80 38 - 126 U/L   Total Bilirubin 2.0 (H) 0.3 - 1.2 mg/dL   GFR, Estimated 49 (L) >60 mL/min    Comment: (NOTE) Calculated using the CKD-EPI Creatinine Equation (2021)    Anion gap 5 5 - 15    Comment: Performed at [facility], [place] [address], [place], [place] [address]  Iron and TIBC     Status: Abnormal   Collection Time: 07/14/22  1:09 PM  Result Value Ref Range   Iron 140 28 - 170 ug/dL   TIBC 172 (L) 250 - 450 ug/dL   Saturation Ratios 81 (H) 10.4 - 31.8 %   UIBC 32 ug/dL    Comment: Performed at [facility] [address] [place], [place] [address]  Ferritin     Status: Abnormal   Collection Time: 07/14/22  1:09 PM  Result Value Ref Range   Ferritin 555 (H) 11 - 307 ng/mL    Comment: Performed at [facility], [place] [address] [place], [place] [address]  Magnesium     Status: None   Collection Time: 07/14/22  1:09 PM  Result Value Ref Range   Magnesium 1.8 1.7 - 2.4 mg/dL    Comment: Performed at [facility], [place] [address], [place],  [address]  CBC with Differential     Status: Abnormal   Collection Time: 07/14/22  1:10 PM  Result Value Ref Range   WBC 3.7 (L) 4.0 - 10.5 K/uL   RBC 2.84 (L) 3.87 - 5.11 MIL/uL   Hemoglobin 7.5 (L) 12.0 - 15.0 g/dL   HCT 22.7 (L) 36.0 - 46.0 %   MCV 79.9 (L) 80.0 - 100.0 fL   MCH 26.4 26.0 - 34.0 pg   MCHC 33.0 30.0 - 36.0 g/dL   RDW 20.4 (H) 11.5 -  15.5 %   Platelets 90 (L) 150 - 400 K/uL    Comment: Immature Platelet Fraction may be clinically indicated, consider ordering this additional test [id number] REPEATED TO VERIFY    nRBC 0.0 0.0 - 0.2 %   Neutrophils Relative % 63 %   Neutro Abs 2.3 1.7 - 7.7 K/uL   Lymphocytes Relative 25 %   Lymphs Abs 1.0 0.7 - 4.0 K/uL   Monocytes Relative 9 %   Monocytes Absolute 0.4 0.1 - 1.0 K/uL   Eosinophils Relative 2 %   Eosinophils Absolute 0.1 0.0 - 0.5 K/uL   Basophils Relative 1 %   Basophils Absolute 0.0 0.0 - 0.1 K/uL   Immature Granulocytes 0 %   Abs Immature Granulocytes 0.01 0.00 - 0.07 K/uL    Comment: Performed at [facility] [address], [place], [place] [address]  ABO/Rh     Status: None   Collection Time: 07/14/22  1:10 PM  Result Value Ref Range   ABO/RH(D)      A POS Performed at [facility] [address], [place], [place] [address]   Type and screen [place]     Status: None (Preliminary result)   Collection Time: 07/14/22  5:14 PM  Result Value Ref Range   ABO/RH(D) A POS    Antibody Screen NEG    Sample  Expiration 07/17/2022,2359    Unit Number [id number]    Blood Component Type RED CELLS,LR    Unit division 00    Status of Unit ISSUED    Transfusion Status OK TO TRANSFUSE    Crossmatch Result      Compatible Performed at [facility], [place] [address] [place], [place] [address]   Protime-INR     Status: Abnormal   Collection Time: 07/14/22  5:14 PM  Result Value Ref Range   Prothrombin Time 18.7 (H) 11.4 - 15.2 seconds   INR 1.6 (H) 0.8 - 1.2    Comment: (NOTE) INR goal varies based on device and disease states. Performed at [facility], [place] [address], [place], [place] [address]   POC occult blood, ED     Status: Abnormal   Collection Time: 07/14/22  5:24 PM  Result Value Ref Range   Fecal Occult Bld POSITIVE (A) NEGATIVE  CBC with Differential/Platelet     Status: Abnormal   Collection Time: 07/15/22 12:20 AM  Result Value Ref Range   WBC 4.1 4.0 - 10.5 K/uL   RBC 2.85 (L) 3.87 - 5.11 MIL/uL   Hemoglobin 7.6 (L) 12.0 - 15.0 g/dL   HCT 22.4 (L) 36.0 - 46.0 %   MCV 78.6 (L) 80.0 - 100.0 fL   MCH 26.7 26.0 - 34.0 pg   MCHC 33.9 30.0 - 36.0 g/dL   RDW 20.7 (H) 11.5 - 15.5 %   Platelets 81 (L) 150 - 400 K/uL    Comment: Immature Platelet Fraction may be clinically indicated, consider ordering this additional test [id number] REPEATED TO VERIFY    nRBC 0.0 0.0 - 0.2 %   Neutrophils Relative % 47 %   Neutro Abs 2.0 1.7 - 7.7 K/uL   Lymphocytes Relative 40 %   Lymphs Abs 1.6 0.7 - 4.0 K/uL   Monocytes Relative 9 %   Monocytes Absolute 0.4 0.1 - 1.0 K/uL   Eosinophils Relative 3 %   Eosinophils Absolute 0.1 0.0 - 0.5 K/uL   Basophils Relative 1 %   Basophils Absolute  0.0 0.0 - 0.1 K/uL   Immature Granulocytes 0 %   Abs Immature Granulocytes 0.01 0.00 - 0.07 K/uL    Comment: Performed at [facility], [place] [address] [place], [place] [address]  Comprehensive metabolic panel     Status: Abnormal   Collection Time: 07/15/22 12:20 AM  Result Value  Ref Range   Sodium 133 (L) 135 - 145 mmol/L   Potassium 3.8 3.5 - 5.1 mmol/L   Chloride 107 98 - 111 mmol/L   CO2 21 (L) 22 - 32 mmol/L   Glucose, Bld 121 (H) 70 - 99 mg/dL    Comment: Glucose reference range applies only to samples taken after fasting for at least 8 hours.   BUN 7 6 - 20 mg/dL   Creatinine, Ser 1.16 (H) 0.44 - 1.00 mg/dL   Calcium 8.1 (L) 8.9 - 10.3 mg/dL   Total Protein 7.4 6.5 - 8.1 g/dL   Albumin 1.9 (L) 3.5 - 5.0 g/dL   AST 54 (H) 15 - 41 U/L   ALT 21 0 - 44 U/L   Alkaline Phosphatase 85 38 - 126 U/L   Total Bilirubin 1.8 (H) 0.3 - 1.2 mg/dL   GFR, Estimated 55 (L) >60 mL/min    Comment: (NOTE) Calculated using the CKD-EPI Creatinine Equation (2021)    Anion gap 5 5 - 15    Comment: Performed at [facility], [place] [address] [place], [place] [address]  Magnesium     Status: None   Collection Time: 07/15/22 12:20 AM  Result Value Ref Range   Magnesium 1.7 1.7 - 2.4 mg/dL    Comment: Performed at [facility] [address], [place], [place] [address]  Protime-INR     Status: Abnormal   Collection Time: 07/15/22 12:20 AM  Result Value Ref Range   Prothrombin Time 19.7 (H) 11.4 - 15.2 seconds   INR 1.7 (H) 0.8 - 1.2    Comment: (NOTE) INR goal varies based on device and disease states. Performed at [facility], [place] [address], [place], [place] [address]   Reticulocytes     Status: Abnormal   Collection Time: 07/15/22 12:20 AM  Result Value Ref Range   Retic Ct Pct 3.7 (H) 0.4 - 3.1 %   RBC. 2.79 (L) 3.87 - 5.11 MIL/uL   Retic Count, Absolute 103.8 19.0 - 186.0 K/uL   Immature Retic Fract 24.2 (H) 2.3 - 15.9 %    Comment: Performed at [facility] [address] [place], [place] [address]  Hemoglobin and hematocrit, blood     Status: Abnormal   Collection Time: 07/15/22  4:50 PM  Result Value Ref Range   Hemoglobin 7.6 (L) 12.0 - 15.0 g/dL   HCT 22.5 (L) 36.0 - 46.0 %    Comment: Performed at [facility], [place] [address], [place] [address]  CBC     Status: Abnormal   Collection Time: 07/16/22  6:27 AM  Result Value Ref Range   WBC 3.1 (L) 4.0 - 10.5 K/uL   RBC 2.46 (L) 3.87 - 5.11 MIL/uL   Hemoglobin 6.6 (LL) 12.0 - 15.0 g/dL    Comment: Reticulocyte Hemoglobin testing may be clinically indicated, consider ordering this additional test [id number] THIS CRITICAL RESULT HAS VERIFIED AND BEEN CALLED TO KENNAY ARMSTRONG, RN BY BERTRAM TAYLOR ON 02 24 2024 AT 0658, AND HAS BEEN READ BACK.     HCT 18.8 (L) 36.0 -  46.0 %   MCV 76.4 (L) 80.0 - 100.0 fL   MCH 26.8 26.0 - 34.0 pg   MCHC 35.1 30.0 - 36.0 g/dL   RDW 20.0 (H) 11.5 - 15.5 %   Platelets 75 (L) 150 - 400 K/uL    Comment: Immature Platelet Fraction may be clinically indicated, consider ordering this additional test [id number]    nRBC 0.0 0.0 - 0.2 %    Comment: Performed at [facility] [address] [place], [place] [address]  Protime-INR     Status: Abnormal   Collection Time: 07/16/22  6:27 AM  Result Value Ref Range   Prothrombin Time 20.2 (H) 11.4 - 15.2 seconds   INR 1.7 (H) 0.8 - 1.2    Comment: (NOTE) INR goal varies based on device and disease states. Performed at [facility], [place] [address] [place], [place] [address]   Prepare RBC (crossmatch)     Status: None   Collection Time: 07/16/22  7:13 AM  Result Value Ref Range   Order Confirmation      ORDERS RECEIVED TO CROSSMATCH Performed at [facility], [address], [place],  [address]     Studies/Results: US Abdomen Complete  Result Date: 07/15/2022 CLINICAL DATA:  Cirrhosis EXAM: ABDOMEN ULTRASOUND COMPLETE COMPARISON:  CT 07/14/2022 FINDINGS: Gallbladder: Surgically absent. Common bile duct: Diameter: 2.5 mm, normal. No intrahepatic ductal dilation. Liver: There is a 1.7 x 1.5 x 1.9 cm hypoechoic mass in the right hepatic lobe. Questionable internal vascularity. Mildly nodular liver contours. Portal vein is patent on color Doppler  imaging with normal direction of blood flow towards the liver. IVC: No abnormality visualized. Pancreas: Visualized portion unremarkable. Spleen: Size and appearance within normal limits. Right Kidney: Length: 10.0 cm. Echogenicity within normal limits. No mass or hydronephrosis visualized. Left Kidney: Length: 10.4 cm. Echogenicity is within normal limits. No hydronephrosis. There is a 1.4 cm cyst in the upper pole which is better evaluated on prior CTA of the abdomen and is likely a simple cyst. Prominent column of Bertin Abdominal aorta: No aneurysm visualized. Other findings: None. IMPRESSION: 1.9 cm hypoechoic mass in right hepatic lobe. Mildly nodular liver contours, which could suggest underlying chronic liver disease/possible cirrhosis. Recommend non-emergent MRI with and without contrast as an outpatient to assess the right hepatic lobe mass, when the patient is stable and able to follow breathing instructions. Electronically Signed   By: Maurine Simmering M.D.   On: 07/15/2022 16:44   CT ANGIO GI BLEED  Result Date: 07/14/2022 CLINICAL DATA:  Follow-up abdominal aortic aneurysm. Vaginal bleeding. EXAM: CTA ABDOMEN AND PELVIS WITHOUT AND WITH CONTRAST TECHNIQUE: Multidetector CT imaging of the abdomen and pelvis was performed using the standard protocol during bolus administration of intravenous contrast. Multiplanar reconstructed images and MIPs were obtained and reviewed to evaluate the vascular anatomy. RADIATION DOSE REDUCTION: This exam was performed according to the departmental dose-optimization program which includes automated exposure control, adjustment of the mA and/or kV according to patient size and/or use of iterative reconstruction technique. CONTRAST:  196m OMNIPAQUE IOHEXOL 350 MG/ML SOLN COMPARISON:  01/26/2022 FINDINGS: VASCULAR Aorta: Unenhanced images demonstrate no significant vascular calcifications. Surgical clips in the right upper quadrant consistent with cholecystectomy. Surgical  clips in the pelvis consistent with tubal ligations. Normal caliber abdominal aorta. No aneurysm or dissection. No significant stenosis. Celiac: Patent without evidence of aneurysm, dissection, vasculitis or significant stenosis. SMA: Patent without evidence of aneurysm, dissection, vasculitis or significant stenosis. Renals: Both  renal arteries are patent without evidence of aneurysm, dissection, vasculitis, fibromuscular dysplasia or significant stenosis. IMA: Patent without evidence of aneurysm, dissection, vasculitis or significant stenosis. Inflow: Patent without evidence of aneurysm, dissection, vasculitis or significant stenosis. Proximal Outflow: Bilateral common femoral and visualized portions of the superficial and profunda femoral arteries are patent without evidence of aneurysm, dissection, vasculitis or significant stenosis. Veins: No obvious venous abnormality within the limitations of this arterial phase study. Review of the MIP images confirms the above findings. NON-VASCULAR Lower chest: Trace left pleural effusion with basilar atelectasis. Small esophageal hiatal hernia. Hepatobiliary: No focal liver abnormality is seen. Status post cholecystectomy. No biliary dilatation. Pancreas: Unremarkable. No pancreatic ductal dilatation or surrounding inflammatory changes. Spleen: Normal in size without focal abnormality. Adrenals/Urinary Tract: Adrenal glands are unremarkable. Kidneys are normal, without renal calculi, focal lesion, or hydronephrosis. Bladder is unremarkable. Stomach/Bowel: Stomach, small bowel, and colon are not abnormally distended. There is evidence of some wall thickening in the colon with areas including the cecum, sigmoid colon, and rectum involved. This may represent inflammatory process such as Crohn disease or other colitis. Diverticulosis of the sigmoid colon without evidence of acute diverticulitis. Appendix is normal. No evidence of any focal area of intraluminal contrast  opacification, suggesting no identified areas of active extravasation/gastrointestinal bleeding. Lymphatic: No significant lymphadenopathy. Reproductive: Surgical absence of the uterus. No abnormal adnexal masses. Other: No free air or free fluid in the abdomen. Abdominal wall musculature appears intact. Musculoskeletal: Degenerative changes in the spine. IMPRESSION: VASCULAR Normal abdominal aorta. No evidence of aneurysm or dissection. No large vessel occlusion or acute abnormality. NON-VASCULAR 1. No evidence of active contrast extravasation to suggest a focal site of gastrointestinal bleeding. 2. Colonic wall thickening involving the rectosigmoid and cecal regions suggesting colitis, possibly Crohn disease. 3. Trace left pleural effusion with basilar atelectasis. Electronically Signed   By: Lucienne Capers M.D.   On: 07/14/2022 21:38   US PELVIC COMPLETE WITH TRANSVAGINAL  Result Date: 07/14/2022 CLINICAL DATA:  [id number] Vaginal bleeding [id number] EXAM: ULTRASOUND OF PELVIS TECHNIQUE: Transabdominal and transvaginalultrasound examination of the pelvis was performed including evaluation of the uterus, ovaries, adnexal regions, and pelvic cul-de-sac. COMPARISON:  None Available. FINDINGS: Patient is status post hysterectomy. Ovaries are not identified. No masses or fluid collections are seen. IMPRESSION: Status post hysterectomy. No adnexal pathology. Ovaries not identified. Electronically Signed   By: Sammie Bench M.D.   On: 07/14/2022 17:02    Medications: I have reviewed the patient's current medications.  Assessment: Painless hematochezia Nosebleed?  Hematemesis Hemoglobin low at 6.6, platelet low 75 Abnormal CAT scan: Rectosigmoid and cecal thickening, possibly Crohn's Ultrasound: 1.9 cm right hepatic lobe hypoechoic mass, mildly nodular liver suggesting possible cirrhosis, MRI with and without contrast recommended  Colonoscopy 2021: Jansen in left colon s, otherwise unremarkable, normal  TI EGD 2021: Gastritis, 2 cm hiatal hernia, no H. pylori, no celiac  Plan: PT 20.2, INR 1.7, T. bili/AST/ALT/ALP of 1.8/54/21/85, sodium 133, BUN/creatinine/GFR 7/1.16/55 MELD sodium score 19 Ferritin 555 with iron saturation 81%?  Possible hemochromatosis Hepatitis B surface antigen and hep B core IgM positive in 9/23(negative hepatitis C antibody)  Agree with blood transfusion. Full liquid diet today, clear liquid diet tomorrow with colonic prep tomorrow. Plan EGD and colonoscopy on Monday. Will send hepatitis B PCR DNA along with alpha-fetoprotein. Plan MRI with and without contrast of the liver along with alpha-fetoprotein while in the hospital.  Ronnette Juniper, MD 07/16/2022, 9:02 AM

## 2022-07-17 DIAGNOSIS — C220 Liver cell carcinoma: Secondary | ICD-10-CM

## 2022-07-17 LAB — HEMOGLOBIN AND HEMATOCRIT, BLOOD
HCT: 20.6 % — ABNORMAL LOW (ref 36.0–46.0)
HCT: 28.8 % — ABNORMAL LOW (ref 36.0–46.0)
Hemoglobin: 7.3 g/dL — ABNORMAL LOW (ref 12.0–15.0)
Hemoglobin: 9.7 g/dL — ABNORMAL LOW (ref 12.0–15.0)

## 2022-07-17 LAB — PREPARE RBC (CROSSMATCH)

## 2022-07-17 MED ORDER — PEG-KCL-NACL-NASULF-NA ASC-C 100 G PO SOLR
1.0000 | Freq: Once | ORAL | Status: AC
  Administered 2022-07-17: 200 g via ORAL
  Filled 2022-07-17: qty 1

## 2022-07-17 MED ORDER — ONDANSETRON HCL 4 MG/2ML IJ SOLN
4.0000 mg | Freq: Four times a day (QID) | INTRAMUSCULAR | Status: DC
  Administered 2022-07-17: 4 mg via INTRAVENOUS
  Filled 2022-07-17: qty 2

## 2022-07-17 MED ORDER — SODIUM CHLORIDE 0.9% IV SOLUTION: Freq: Once | INTRAVENOUS | Status: AC

## 2022-07-17 NOTE — Anesthesia Preprocedure Evaluation (Signed)
Anesthesia Evaluation  Patient identified by MRN, date of birth, ID band Patient awake    Reviewed: Allergy & Precautions, NPO status , Patient's Chart, lab work & pertinent test results, reviewed documented beta blocker date and time   History of Anesthesia Complications Negative for: history of anesthetic complications  Airway Mallampati: III  TM Distance: >3 FB Neck ROM: Full    Dental  (+) Edentulous Upper, Edentulous Lower   Pulmonary neg pulmonary ROS   Pulmonary exam normal breath sounds clear to auscultation       Cardiovascular hypertension (carvedilol, losartan), Pt. on home beta blockers and Pt. on medications (-) angina + Past MI (NSTEMI, patient unaware)  (-) Cardiac Stents and (-) CABG (-) dysrhythmias  Rhythm:Regular Rate:Normal  TTE 10/04/2020: IMPRESSIONS     1. Left ventricular ejection fraction, by estimation, is 60 to 65%. The  left ventricle has normal function. The left ventricle has no regional  wall motion abnormalities. There is mild left ventricular hypertrophy.  Left ventricular diastolic parameters  are indeterminate.   2. Right ventricular systolic function is normal. The right ventricular  size is normal. There is normal pulmonary artery systolic pressure. The  estimated right ventricular systolic pressure is 0000000 mmHg.   3. The mitral valve is grossly normal. Trivial mitral valve  regurgitation.   4. The aortic valve is tricuspid. Aortic valve regurgitation is not  visualized. Mild to moderate aortic valve sclerosis/calcification is  present, without any evidence of aortic stenosis. Aortic valve mean  gradient measures 4.0 mmHg.   5. The inferior vena cava is normal in size with greater than 50%  respiratory variability, suggesting right atrial pressure of 3 mmHg.   Normal myocardial perfusion study 10/04/2020    Neuro/Psych  Headaches, neg Seizures    GI/Hepatic Bowel prep,GERD   Medicated,,(+) Hepatitis -, [place]   Endo/Other  negative endocrine ROS    Renal/GU CRFRenal disease     Musculoskeletal   Abdominal  (+) + obese  Peds  Hematology  (+) Blood dyscrasia, anemia   Anesthesia Other Findings Hgb 9.7  Reproductive/Obstetrics                             Anesthesia Physical Anesthesia Plan  ASA: 3  Anesthesia Plan: MAC   Post-op Pain Management: Minimal or no pain anticipated   Induction: Intravenous  PONV Risk Score and Plan: 2 and Propofol infusion and Treatment may vary due to age or medical condition  Airway Management Planned: Natural Airway and Nasal Cannula  Additional Equipment:   Intra-op Plan:   Post-operative Plan:   Informed Consent: I have reviewed the patients History and Physical, chart, labs and discussed the procedure including the risks, benefits and alternatives for the proposed anesthesia with the patient or authorized representative who has indicated his/her understanding and acceptance.     Dental advisory given  Plan Discussed with: CRNA and Anesthesiologist  Anesthesia Plan Comments: (Discussed with patient risks of MAC including, but not limited to, minor pain or discomfort, hearing people in the room, and possible need for backup general anesthesia. Risks for general anesthesia also discussed including, but not limited to, sore throat, hoarse voice, chipped/damaged teeth, injury to vocal cords, nausea and vomiting, allergic reactions, lung infection, heart attack, stroke, and death. All questions answered. )        Anesthesia Quick Evaluation

## 2022-07-17 NOTE — Progress Notes (Signed)
Patient completed her moviprep. Patient verbalized that her stool is "nothing but clear green watery". She denied any pain, discomfort, or dizziness at this time. VSS. She is aware to be NPO for procedure in the AM.

## 2022-07-17 NOTE — H&P (View-Only) (Signed)
Subjective: Denies further rectal bleeding. Is on clear liquid diet and starting her colonic prep.  Objective: Vital signs in last 24 hours: Temp:  [98.1 F (36.7 C)-98.4 F (36.9 C)] 98.3 F (36.8 C) (02/25 0940) Pulse Rate:  [71-83] 71 (02/25 0940) Resp:  [15-20] 18 (02/25 0940) BP: (101-130)/(46-72) 102/63 (02/25 0940) SpO2:  [100 %] 100 % (02/25 0940) Weight change:  Last BM Date : 07/14/22  PE: Nonicteric, mild pallor GENERAL: Awake, oriented x 3  ABDOMEN: Not distended, nontender EXTREMITIES: No deformity  Lab Results: Results for orders placed or performed during the hospital encounter of 07/14/22 (from the past 48 hour(s))  Hemoglobin and hematocrit, blood     Status: Abnormal   Collection Time: 07/15/22  4:50 PM  Result Value Ref Range   Hemoglobin 7.6 (L) 12.0 - 15.0 g/dL   HCT 22.5 (L) 36.0 - 46.0 %    Comment: Performed at [facility], [address], [place] [address]  CBC     Status: Abnormal   Collection Time: 07/16/22  6:27 AM  Result Value Ref Range   WBC 3.1 (L) 4.0 - 10.5 K/uL   RBC 2.46 (L) 3.87 - 5.11 MIL/uL   Hemoglobin 6.6 (LL) 12.0 - 15.0 g/dL    Comment: Reticulocyte Hemoglobin testing may be clinically indicated, consider ordering this additional test [id number] THIS CRITICAL RESULT HAS VERIFIED AND BEEN CALLED TO KENNAY ARMSTRONG, RN BY BERTRAM TAYLOR ON 02 24 2024 AT 0658, AND HAS BEEN READ BACK.     HCT 18.8 (L) 36.0 - 46.0 %   MCV 76.4 (L) 80.0 - 100.0 fL   MCH 26.8 26.0 - 34.0 pg   MCHC 35.1 30.0 - 36.0 g/dL   RDW 20.0 (H) 11.5 - 15.5 %   Platelets 75 (L) 150 - 400 K/uL    Comment: Immature Platelet Fraction may be clinically indicated, consider ordering this additional test [id number]    nRBC 0.0 0.0 - 0.2 %    Comment: Performed at [facility] [address], [place], [place] [address]  Protime-INR     Status: Abnormal   Collection Time: 07/16/22  6:27 AM  Result Value Ref Range   Prothrombin Time 20.2 (H)  11.4 - 15.2 seconds   INR 1.7 (H) 0.8 - 1.2    Comment: (NOTE) INR goal varies based on device and disease states. Performed at [facility], [place] [address], [place], [place] [address]   Prepare RBC (crossmatch)     Status: None   Collection Time: 07/16/22  7:13 AM  Result Value Ref Range   Order Confirmation      ORDERS RECEIVED TO CROSSMATCH Performed at [facility], [address], [place], [place] [address]   Hemoglobin and hematocrit, blood     Status: Abnormal   Collection Time: 07/16/22 12:59 PM  Result Value Ref Range   Hemoglobin 7.9 (L) 12.0 - 15.0 g/dL   HCT 22.9 (L) 36.0 - 46.0 %    Comment: Performed at [facility], [place] [address], [place], [place] [address]  Hemoglobin and hematocrit, blood     Status: Abnormal   Collection Time: 07/16/22  5:51 PM  Result Value Ref Range   Hemoglobin 8.9 (L) 12.0 - 15.0 g/dL   HCT 25.4 (L) 36.0 - 46.0 %    Comment: Performed at [facility], [place] [address], [place], [place] [address]  Hemoglobin and hematocrit, blood  Status: Abnormal   Collection Time: 07/17/22  6:22 AM  Result Value Ref Range   Hemoglobin 7.3 (L) 12.0 - 15.0 g/dL   HCT 20.6 (L) 36.0 - 46.0 %    Comment: Performed at [facility] [address] [place], [place] [address]  Prepare RBC (crossmatch)     Status: None   Collection Time: 07/17/22  8:03 AM  Result Value Ref Range   Order Confirmation      ORDER PROCESSED BY BLOOD BANK Performed at [facility], [place] [address], [place], [place] [address]     Studies/Results: MR ABDOMEN W WO CONTRAST  Result Date: 07/16/2022 CLINICAL DATA:  Cirrhosis.  Hepatic mass on recent ultrasound. EXAM: MRI ABDOMEN WITHOUT AND WITH CONTRAST TECHNIQUE: Multiplanar multisequence MR imaging of the abdomen was performed both before and after the administration of intravenous contrast. CONTRAST:  72m GADAVIST GADOBUTROL 1 MMOL/ML IV SOLN COMPARISON:  Ultrasound on 07/15/2022 FINDINGS: Lower  chest: No acute findings. Hepatobiliary: Findings of hepatic cirrhosis are seen. Recanalization paraumbilical veins is consistent with portal venous hypertension. A mass is seen in segment 8 of the right lobe which measures 1.6 x 1.5 cm, and shows arterial phase hyperenhancement, contrast out washout, and delayed peripheral rim enhancement. Several other sub-centimeter foci of arterial phase hyperenhancement also seen in the right hepatic lobe, which are more difficult to characterize due to their small size. These lesions show no definite contrast washout or delayed peripheral rim enhancement. Prior cholecystectomy. No evidence of biliary obstruction. Pancreas:  No mass or inflammatory changes. Spleen:  Within normal limits in size and appearance. Adrenals/Urinary Tract: No suspicious masses identified. A tiny benign Bosniak category 2 hemorrhagic cyst is seen in the lateral midpole the left kidney. (no followup imaging is recommended). No evidence of hydronephrosis. Stomach/Bowel: Left-sided colonic diverticulosis, without evidence of diverticulitis in this region. Diffuse mesenteric and body wall edema, without ascites. Vascular/Lymphatic: No pathologically enlarged lymph nodes identified. No acute vascular findings. Left upper quadrant portosystemic collaterals, consistent with portal venous hypertension. Other:  None. Musculoskeletal:  No suspicious bone lesions identified. IMPRESSION: Hepatic cirrhosis and findings of portal venous hypertension. 1.6 cm mass in segment 8 of the right hepatic lobe, which is diagnostic for hepatocellular carcinoma. (LI-RADS Category 5: Definitely hepatocellular carcinoma). Several other sub-centimeter foci of arterial phase hyperenhancement in the right hepatic lobe, which show no contrast washout or peripheral capsular enhancement. (LI-RADS Category 3: Intermediate probability of malignancy). Recommend continued follow-up by abdomen MRI without and with contrast in 6 months. No  evidence of metastatic disease. Electronically Signed   By: JMarlaine HindM.D.   On: 07/16/2022 17:02   UKoreaAbdomen Complete  Result Date: 07/15/2022 CLINICAL DATA:  Cirrhosis EXAM: ABDOMEN ULTRASOUND COMPLETE COMPARISON:  CT 07/14/2022 FINDINGS: Gallbladder: Surgically absent. Common bile duct: Diameter: 2.5 mm, normal. No intrahepatic ductal dilation. Liver: There is a 1.7 x 1.5 x 1.9 cm hypoechoic mass in the right hepatic lobe. Questionable internal vascularity. Mildly nodular liver contours. Portal vein is patent on color Doppler imaging with normal direction of blood flow towards the liver. IVC: No abnormality visualized. Pancreas: Visualized portion unremarkable. Spleen: Size and appearance within normal limits. Right Kidney: Length: 10.0 cm. Echogenicity within normal limits. No mass or hydronephrosis visualized. Left Kidney: Length: 10.4 cm. Echogenicity is within normal limits. No hydronephrosis. There is a 1.4 cm cyst in the upper pole which is better evaluated on prior CTA of the abdomen and is likely a simple cyst. Prominent column  of Bertin Abdominal aorta: No aneurysm visualized. Other findings: None. IMPRESSION: 1.9 cm hypoechoic mass in right hepatic lobe. Mildly nodular liver contours, which could suggest underlying chronic liver disease/possible cirrhosis. Recommend non-emergent MRI with and without contrast as an outpatient to assess the right hepatic lobe mass, when the patient is stable and able to follow breathing instructions. Electronically Signed   By: Maurine Simmering M.D.   On: 07/15/2022 16:44    Medications: I have reviewed the patient's current medications.  Assessment: 1.6 cm mass in segment 8 of right hepatic lobe diagnostic for hepatocellular carcinoma  Hepatic cirrhosis with portal venous hypertension, MELD Na 19  Chronic Hepatitis B(diagnosed in September 2023, seen at [facility] by [facility] on 06/15/2022 with plans to start [facility]) Hepatitis B PCR DNA, viral load  pending  Hematochezia, resolved, hemoglobin 7.3 today  Epistaxis/hematemesis  Plan: Colonoscopy and EGD scheduled for tomorrow.  Will need to be referred to hepatology center as an outpatient-[facility] or [facility] for liver transplant evaluation, treatment of chronic hepatitis B and hepatocellular carcinoma.  This was discussed in detail with the patient and with her daughter over the phone,Quenisha Brown([phone]).   Ronnette Juniper, MD 07/17/2022, 11:13 AM

## 2022-07-17 NOTE — Progress Notes (Signed)
[facility] PROGRESS NOTE    Progress Note  Marissa Warner  [id number] DOB: May 08, 1966 DOA: 07/14/2022 PCP: Nolene Ebbs, MD     Brief Narrative:   Marissa Warner is an 57 y.o. female past medical history of essential hypertension, anemia came into the emergency room for painless rectal bleeding GI was consulted   Assessment/Plan:   Painless Acute lower GI bleeding: Likely diverticular bleed, EGD showed no acute findings. Bleeding scan was negative. GI consulted recommended colonoscopy today, possibly EGD on 07/18/2022. Hemoglobin 7.3 will transfuse another unit packed red blood cells.  Check current H&H post transfusional. PT 20 INR 1.7. Check for static slightly lightheaded upon standing  Hepatocellular carcinoma: MRI showed 1.6 cm mass of the right hepatic lobe. Will discuss with GI.  Essential hypertension: Currently stable holding anti-HTN  medication: Lisinopril, continue Coreg.  Cirrhosis of the liver: Confirmed by ultrasound follow-up with GI as an outpatient. MRI of the abdomen without contrast showed 1.7 cm mass of the right hepatic lobe concerning for hepatocellular carcinoma hepatic cirrhosis and portal hypertension, alpha-fetoprotein  Chronic hepatitis B: Hepatitis B PCR DNA.  DVT prophylaxis: scd Family Communication:none Status is: Inpatient Remains inpatient appropriate because: Acute GI bleed    Code Status:     Code Status Orders  (From admission, onward)           Start     Ordered   07/14/22 2311  Full code  Continuous       Question:  By:  Answer:  Consent: discussion documented in EHR   07/14/22 2310           Code Status History     Date Active Date Inactive Code Status Order ID Comments User Context   10/04/2020 0510 10/04/2020 2159 Full Code [id number]  Jaci Lazier, MD ED         IV Access:   Peripheral IV   Procedures and diagnostic studies:   MR ABDOMEN W WO CONTRAST  Result Date:  07/16/2022 CLINICAL DATA:  Cirrhosis.  Hepatic mass on recent ultrasound. EXAM: MRI ABDOMEN WITHOUT AND WITH CONTRAST TECHNIQUE: Multiplanar multisequence MR imaging of the abdomen was performed both before and after the administration of intravenous contrast. CONTRAST:  79m GADAVIST GADOBUTROL 1 MMOL/ML IV SOLN COMPARISON:  Ultrasound on 07/15/2022 FINDINGS: Lower chest: No acute findings. Hepatobiliary: Findings of hepatic cirrhosis are seen. Recanalization paraumbilical veins is consistent with portal venous hypertension. A mass is seen in segment 8 of the right lobe which measures 1.6 x 1.5 cm, and shows arterial phase hyperenhancement, contrast out washout, and delayed peripheral rim enhancement. Several other sub-centimeter foci of arterial phase hyperenhancement also seen in the right hepatic lobe, which are more difficult to characterize due to their small size. These lesions show no definite contrast washout or delayed peripheral rim enhancement. Prior cholecystectomy. No evidence of biliary obstruction. Pancreas:  No mass or inflammatory changes. Spleen:  Within normal limits in size and appearance. Adrenals/Urinary Tract: No suspicious masses identified. A tiny benign Bosniak category 2 hemorrhagic cyst is seen in the lateral midpole the left kidney. (no followup imaging is recommended). No evidence of hydronephrosis. Stomach/Bowel: Left-sided colonic diverticulosis, without evidence of diverticulitis in this region. Diffuse mesenteric and body wall edema, without ascites. Vascular/Lymphatic: No pathologically enlarged lymph nodes identified. No acute vascular findings. Left upper quadrant portosystemic collaterals, consistent with portal venous hypertension. Other:  None. Musculoskeletal:  No suspicious bone lesions identified. IMPRESSION: Hepatic cirrhosis and findings of portal venous hypertension. 1.6 cm mass  in segment 8 of the right hepatic lobe, which is diagnostic for hepatocellular carcinoma.  (LI-RADS Category 5: Definitely hepatocellular carcinoma). Several other sub-centimeter foci of arterial phase hyperenhancement in the right hepatic lobe, which show no contrast washout or peripheral capsular enhancement. (LI-RADS Category 3: Intermediate probability of malignancy). Recommend continued follow-up by abdomen MRI without and with contrast in 6 months. No evidence of metastatic disease. Electronically Signed   By: Marlaine Hind M.D.   On: 07/16/2022 17:02   US Abdomen Complete  Result Date: 07/15/2022 CLINICAL DATA:  Cirrhosis EXAM: ABDOMEN ULTRASOUND COMPLETE COMPARISON:  CT 07/14/2022 FINDINGS: Gallbladder: Surgically absent. Common bile duct: Diameter: 2.5 mm, normal. No intrahepatic ductal dilation. Liver: There is a 1.7 x 1.5 x 1.9 cm hypoechoic mass in the right hepatic lobe. Questionable internal vascularity. Mildly nodular liver contours. Portal vein is patent on color Doppler imaging with normal direction of blood flow towards the liver. IVC: No abnormality visualized. Pancreas: Visualized portion unremarkable. Spleen: Size and appearance within normal limits. Right Kidney: Length: 10.0 cm. Echogenicity within normal limits. No mass or hydronephrosis visualized. Left Kidney: Length: 10.4 cm. Echogenicity is within normal limits. No hydronephrosis. There is a 1.4 cm cyst in the upper pole which is better evaluated on prior CTA of the abdomen and is likely a simple cyst. Prominent column of Bertin Abdominal aorta: No aneurysm visualized. Other findings: None. IMPRESSION: 1.9 cm hypoechoic mass in right hepatic lobe. Mildly nodular liver contours, which could suggest underlying chronic liver disease/possible cirrhosis. Recommend non-emergent MRI with and without contrast as an outpatient to assess the right hepatic lobe mass, when the patient is stable and able to follow breathing instructions. Electronically Signed   By: Maurine Simmering M.D.   On: 07/15/2022 16:44     Medical Consultants:    None.   Subjective:    Marissa Warner no further bleeding she relates she is hungry, she relates she is slightly lightheaded upon standing.  Objective:    Vitals:   07/16/22 1129 07/16/22 1602 07/16/22 2030 07/17/22 0358  BP: (!) 105/46 130/72 115/68 107/61  Pulse: 79 83 79 77  Resp:  '16 15 16  '$ Temp: 98.2 F (36.8 C) 98.4 F (36.9 C) 98.4 F (36.9 C) 98.1 F (36.7 C)  TempSrc: Oral Oral Oral Oral  SpO2: 100% 100% 100% 100%  Weight:      Height:       SpO2: 100 %   Intake/Output Summary (Last 24 hours) at 07/17/2022 0802 Last data filed at 07/16/2022 2100 Gross per 24 hour  Intake 1098.92 ml  Output --  Net 1098.92 ml   Filed Weights   07/14/22 1253 07/16/22 0500  Weight: 87.7 kg 82.7 kg    Exam: General exam: In no acute distress. Respiratory system: Good air movement and clear to auscultation. Cardiovascular system: S1 & S2 heard, RRR. No JVD.  Gastrointestinal system: Abdomen is nondistended, soft and nontender.  Extremities: No pedal edema. Skin: No rashes, lesions or ulcers Psychiatry: Judgement and insight appear normal. Mood & affect appropriate.    Data Reviewed:    Labs: Basic Metabolic Panel: Recent Labs  Lab 07/14/22 1309 07/15/22 0020  NA 135 133*  K 3.7 3.8  CL 106 107  CO2 24 21*  GLUCOSE 134* 121*  BUN 8 7  CREATININE 1.27* 1.16*  CALCIUM 8.5* 8.1*  MG 1.8 1.7    GFR Estimated Creatinine Clearance: 56.2 mL/min (A) (by C-G formula based on SCr of 1.16 mg/dL (H)). Liver Function  Tests: Recent Labs  Lab 07/14/22 1309 07/15/22 0020  AST 58* 54*  ALT 25 21  ALKPHOS 80 85  BILITOT 2.0* 1.8*  PROT 7.5 7.4  ALBUMIN 2.0* 1.9*    No results for input(s): "LIPASE", "AMYLASE" in the last 168 hours. No results for input(s): "AMMONIA" in the last 168 hours. Coagulation profile Recent Labs  Lab 07/14/22 1714 07/15/22 0020 07/16/22 0627  INR 1.6* 1.7* 1.7*    COVID-19 Labs  Recent Labs    07/14/22 1309  FERRITIN 555*      Lab Results  Component Value Date   SARSCOV2NAA NEGATIVE 10/04/2020   [place] NEGATIVE 09/05/2019    CBC: Recent Labs  Lab 07/14/22 1310 07/15/22 0020 07/15/22 1650 07/16/22 0627 07/16/22 1259 07/16/22 1751 07/17/22 0622  WBC 3.7* 4.1  --  3.1*  --   --   --   NEUTROABS 2.3 2.0  --   --   --   --   --   HGB 7.5* 7.6* 7.6* 6.6* 7.9* 8.9* 7.3*  HCT 22.7* 22.4* 22.5* 18.8* 22.9* 25.4* 20.6*  MCV 79.9* 78.6*  --  76.4*  --   --   --   PLT 90* 81*  --  75*  --   --   --     Cardiac Enzymes: No results for input(s): "CKTOTAL", "CKMB", "CKMBINDEX", "TROPONINI" in the last 168 hours. BNP (last 3 results) No results for input(s): "PROBNP" in the last 8760 hours. CBG: No results for input(s): "GLUCAP" in the last 168 hours. D-Dimer: No results for input(s): "DDIMER" in the last 72 hours. Hgb A1c: No results for input(s): "HGBA1C" in the last 72 hours. Lipid Profile: No results for input(s): "CHOL", "HDL", "LDLCALC", "TRIG", "CHOLHDL", "LDLDIRECT" in the last 72 hours. Thyroid function studies: No results for input(s): "TSH", "T4TOTAL", "T3FREE", "THYROIDAB" in the last 72 hours.  Invalid input(s): "FREET3" Anemia work up: Recent Labs    07/14/22 1309 07/15/22 0020  FERRITIN 555*  --   TIBC 172*  --   IRON 140  --   RETICCTPCT  --  3.7*    Sepsis Labs: Recent Labs  Lab 07/14/22 1310 07/15/22 0020 07/16/22 0627  WBC 3.7* 4.1 3.1*    Microbiology No results found for this or any previous visit (from the past 240 hour(s)).   Medications:    carvedilol  6.25 mg Oral BID WC   fluticasone  1 spray Each Nare Daily   pantoprazole (PROTONIX) IV  40 mg Intravenous Q24H   polyethylene glycol-electrolytes  4,000 mL Oral Once   Continuous Infusions:  sodium chloride 100 mL/hr at 07/15/22 1842      LOS: 2 days   Marissa Warner  [facility]  07/17/2022, 8:02 AM

## 2022-07-17 NOTE — Progress Notes (Signed)
Subjective: Denies further rectal bleeding. Is on clear liquid diet and starting her colonic prep.  Objective: Vital signs in last 24 hours: Temp:  [98.1 F (36.7 C)-98.4 F (36.9 C)] 98.3 F (36.8 C) (02/25 0940) Pulse Rate:  [71-83] 71 (02/25 0940) Resp:  [15-20] 18 (02/25 0940) BP: (101-130)/(46-72) 102/63 (02/25 0940) SpO2:  [100 %] 100 % (02/25 0940) Weight change:  Last BM Date : 07/14/22  PE: Nonicteric, mild pallor GENERAL: Awake, oriented x 3  ABDOMEN: Not distended, nontender EXTREMITIES: No deformity  Lab Results: Results for orders placed or performed during the hospital encounter of 07/14/22 (from the past 48 hour(s))  Hemoglobin and hematocrit, blood     Status: Abnormal   Collection Time: 07/15/22  4:50 PM  Result Value Ref Range   Hemoglobin 7.6 (L) 12.0 - 15.0 g/dL   HCT 22.5 (L) 36.0 - 46.0 %    Comment: Performed at [facility], [address], [place] [address]  CBC     Status: Abnormal   Collection Time: 07/16/22  6:27 AM  Result Value Ref Range   WBC 3.1 (L) 4.0 - 10.5 K/uL   RBC 2.46 (L) 3.87 - 5.11 MIL/uL   Hemoglobin 6.6 (LL) 12.0 - 15.0 g/dL    Comment: Reticulocyte Hemoglobin testing may be clinically indicated, consider ordering this additional test [id number] THIS CRITICAL RESULT HAS VERIFIED AND BEEN CALLED TO KENNAY ARMSTRONG, RN BY BERTRAM TAYLOR ON 02 24 2024 AT 0658, AND HAS BEEN READ BACK.     HCT 18.8 (L) 36.0 - 46.0 %   MCV 76.4 (L) 80.0 - 100.0 fL   MCH 26.8 26.0 - 34.0 pg   MCHC 35.1 30.0 - 36.0 g/dL   RDW 20.0 (H) 11.5 - 15.5 %   Platelets 75 (L) 150 - 400 K/uL    Comment: Immature Platelet Fraction may be clinically indicated, consider ordering this additional test [id number]    nRBC 0.0 0.0 - 0.2 %    Comment: Performed at [facility] [address] [place], [place] [address]  Protime-INR     Status: Abnormal   Collection Time: 07/16/22  6:27 AM  Result Value Ref Range   Prothrombin Time 20.2 (H)  11.4 - 15.2 seconds   INR 1.7 (H) 0.8 - 1.2    Comment: (NOTE) INR goal varies based on device and disease states. Performed at [facility], [place] [address], [place], [place] [address]   Prepare RBC (crossmatch)     Status: None   Collection Time: 07/16/22  7:13 AM  Result Value Ref Range   Order Confirmation      ORDERS RECEIVED TO CROSSMATCH Performed at [facility], [address] [place], [place] [address]   Hemoglobin and hematocrit, blood     Status: Abnormal   Collection Time: 07/16/22 12:59 PM  Result Value Ref Range   Hemoglobin 7.9 (L) 12.0 - 15.0 g/dL   HCT 22.9 (L) 36.0 - 46.0 %    Comment: Performed at [facility], [place] [address], [place], [place] [address]  Hemoglobin and hematocrit, blood     Status: Abnormal   Collection Time: 07/16/22  5:51 PM  Result Value Ref Range   Hemoglobin 8.9 (L) 12.0 - 15.0 g/dL   HCT 25.4 (L) 36.0 - 46.0 %    Comment: Performed at [facility], [place] [address] [place], [place] [address]  Hemoglobin and hematocrit, blood  Status: Abnormal   Collection Time: 07/17/22  6:22 AM  Result Value Ref Range   Hemoglobin 7.3 (L) 12.0 - 15.0 g/dL   HCT 20.6 (L) 36.0 - 46.0 %    Comment: Performed at [facility] [address], [place], [place] [address]  Prepare RBC (crossmatch)     Status: None   Collection Time: 07/17/22  8:03 AM  Result Value Ref Range   Order Confirmation      ORDER PROCESSED BY BLOOD BANK Performed at [facility], [place] [address], [place], [place] [address]     Studies/Results: MR ABDOMEN W WO CONTRAST  Result Date: 07/16/2022 CLINICAL DATA:  Cirrhosis.  Hepatic mass on recent ultrasound. EXAM: MRI ABDOMEN WITHOUT AND WITH CONTRAST TECHNIQUE: Multiplanar multisequence MR imaging of the abdomen was performed both before and after the administration of intravenous contrast. CONTRAST:  40m GADAVIST GADOBUTROL 1 MMOL/ML IV SOLN COMPARISON:  Ultrasound on 07/15/2022 FINDINGS: Lower  chest: No acute findings. Hepatobiliary: Findings of hepatic cirrhosis are seen. Recanalization paraumbilical veins is consistent with portal venous hypertension. A mass is seen in segment 8 of the right lobe which measures 1.6 x 1.5 cm, and shows arterial phase hyperenhancement, contrast out washout, and delayed peripheral rim enhancement. Several other sub-centimeter foci of arterial phase hyperenhancement also seen in the right hepatic lobe, which are more difficult to characterize due to their small size. These lesions show no definite contrast washout or delayed peripheral rim enhancement. Prior cholecystectomy. No evidence of biliary obstruction. Pancreas:  No mass or inflammatory changes. Spleen:  Within normal limits in size and appearance. Adrenals/Urinary Tract: No suspicious masses identified. A tiny benign Bosniak category 2 hemorrhagic cyst is seen in the lateral midpole the left kidney. (no followup imaging is recommended). No evidence of hydronephrosis. Stomach/Bowel: Left-sided colonic diverticulosis, without evidence of diverticulitis in this region. Diffuse mesenteric and body wall edema, without ascites. Vascular/Lymphatic: No pathologically enlarged lymph nodes identified. No acute vascular findings. Left upper quadrant portosystemic collaterals, consistent with portal venous hypertension. Other:  None. Musculoskeletal:  No suspicious bone lesions identified. IMPRESSION: Hepatic cirrhosis and findings of portal venous hypertension. 1.6 cm mass in segment 8 of the right hepatic lobe, which is diagnostic for hepatocellular carcinoma. (LI-RADS Category 5: Definitely hepatocellular carcinoma). Several other sub-centimeter foci of arterial phase hyperenhancement in the right hepatic lobe, which show no contrast washout or peripheral capsular enhancement. (LI-RADS Category 3: Intermediate probability of malignancy). Recommend continued follow-up by abdomen MRI without and with contrast in 6 months. No  evidence of metastatic disease. Electronically Signed   By: JMarlaine HindM.D.   On: 07/16/2022 17:02   UKoreaAbdomen Complete  Result Date: 07/15/2022 CLINICAL DATA:  Cirrhosis EXAM: ABDOMEN ULTRASOUND COMPLETE COMPARISON:  CT 07/14/2022 FINDINGS: Gallbladder: Surgically absent. Common bile duct: Diameter: 2.5 mm, normal. No intrahepatic ductal dilation. Liver: There is a 1.7 x 1.5 x 1.9 cm hypoechoic mass in the right hepatic lobe. Questionable internal vascularity. Mildly nodular liver contours. Portal vein is patent on color Doppler imaging with normal direction of blood flow towards the liver. IVC: No abnormality visualized. Pancreas: Visualized portion unremarkable. Spleen: Size and appearance within normal limits. Right Kidney: Length: 10.0 cm. Echogenicity within normal limits. No mass or hydronephrosis visualized. Left Kidney: Length: 10.4 cm. Echogenicity is within normal limits. No hydronephrosis. There is a 1.4 cm cyst in the upper pole which is better evaluated on prior CTA of the abdomen and is likely a simple cyst. Prominent column  of Bertin Abdominal aorta: No aneurysm visualized. Other findings: None. IMPRESSION: 1.9 cm hypoechoic mass in right hepatic lobe. Mildly nodular liver contours, which could suggest underlying chronic liver disease/possible cirrhosis. Recommend non-emergent MRI with and without contrast as an outpatient to assess the right hepatic lobe mass, when the patient is stable and able to follow breathing instructions. Electronically Signed   By: Maurine Simmering M.D.   On: 07/15/2022 16:44    Medications: I have reviewed the patient's current medications.  Assessment: 1.6 cm mass in segment 8 of right hepatic lobe diagnostic for hepatocellular carcinoma  Hepatic cirrhosis with portal venous hypertension, MELD Na 19  Chronic Hepatitis B(diagnosed in September 2023, seen at [facility] by [facility] on 06/15/2022 with plans to start [facility]) Hepatitis B PCR DNA, viral load  pending  Hematochezia, resolved, hemoglobin 7.3 today  Epistaxis/hematemesis  Plan: Colonoscopy and EGD scheduled for tomorrow.  Will need to be referred to hepatology center as an outpatient-[facility] or [facility] for liver transplant evaluation, treatment of chronic hepatitis B and hepatocellular carcinoma.  This was discussed in detail with the patient and with her daughter over the phone,Quenisha Brown([phone]).   Ronnette Juniper, MD 07/17/2022, 11:13 AM

## 2022-07-18 ENCOUNTER — Inpatient Hospital Stay (HOSPITAL_COMMUNITY): Payer: Medicaid Other | Admitting: Anesthesiology

## 2022-07-18 ENCOUNTER — Telehealth: Payer: Self-pay | Admitting: Nurse Practitioner

## 2022-07-18 ENCOUNTER — Encounter (HOSPITAL_COMMUNITY): Payer: Self-pay | Admitting: Internal Medicine

## 2022-07-18 ENCOUNTER — Encounter (HOSPITAL_COMMUNITY)
Admission: EM | Disposition: A | Discharge status: Home / Self Care | Payer: Self-pay | Source: Home / Self Care | Attending: Internal Medicine

## 2022-07-18 DIAGNOSIS — K449 Diaphragmatic hernia without obstruction or gangrene: Secondary | ICD-10-CM

## 2022-07-18 DIAGNOSIS — D123 Benign neoplasm of transverse colon: Secondary | ICD-10-CM

## 2022-07-18 DIAGNOSIS — K5730 Diverticulosis of large intestine without perforation or abscess without bleeding: Secondary | ICD-10-CM

## 2022-07-18 DIAGNOSIS — D631 Anemia in chronic kidney disease: Secondary | ICD-10-CM

## 2022-07-18 DIAGNOSIS — K648 Other hemorrhoids: Secondary | ICD-10-CM

## 2022-07-18 DIAGNOSIS — I129 Hypertensive chronic kidney disease with stage 1 through stage 4 chronic kidney disease, or unspecified chronic kidney disease: Secondary | ICD-10-CM

## 2022-07-18 DIAGNOSIS — K644 Residual hemorrhoidal skin tags: Secondary | ICD-10-CM

## 2022-07-18 DIAGNOSIS — N189 Chronic kidney disease, unspecified: Secondary | ICD-10-CM

## 2022-07-18 HISTORY — PX: POLYPECTOMY: SHX5525

## 2022-07-18 HISTORY — PX: ESOPHAGOGASTRODUODENOSCOPY (EGD) WITH PROPOFOL: SHX5813

## 2022-07-18 HISTORY — PX: COLONOSCOPY: SHX5424

## 2022-07-18 LAB — BPAM RBC
Blood Product Expiration Date: 202403012359
Blood Product Expiration Date: 202403082359
ISSUE DATE / TIME: 202402240824
ISSUE DATE / TIME: 202402250922
Unit Type and Rh: 6200
Unit Type and Rh: 6200

## 2022-07-18 LAB — TYPE AND SCREEN
ABO/RH(D): A POS
Antibody Screen: NEGATIVE
Unit division: 0
Unit division: 0

## 2022-07-18 LAB — HEMOGLOBIN AND HEMATOCRIT, BLOOD
HCT: 24.9 % — ABNORMAL LOW (ref 36.0–46.0)
Hemoglobin: 8.8 g/dL — ABNORMAL LOW (ref 12.0–15.0)

## 2022-07-18 LAB — AFP TUMOR MARKER: AFP, Serum, Tumor Marker: 12.5 ng/mL — ABNORMAL HIGH (ref 0.0–9.2)

## 2022-07-18 SURGERY — ESOPHAGOGASTRODUODENOSCOPY (EGD) WITH PROPOFOL
Anesthesia: Monitor Anesthesia Care

## 2022-07-18 MED ORDER — LIDOCAINE 2% (20 MG/ML) 5 ML SYRINGE
INTRAMUSCULAR | Status: DC | PRN
  Administered 2022-07-18: 60 mg via INTRAVENOUS

## 2022-07-18 MED ORDER — LACTATED RINGERS IV SOLN: INTRAVENOUS | Status: DC

## 2022-07-18 MED ORDER — PROPOFOL 10 MG/ML IV BOLUS
INTRAVENOUS | Status: DC | PRN
  Administered 2022-07-18: 30 mg via INTRAVENOUS

## 2022-07-18 MED ORDER — EPHEDRINE SULFATE-NACL 50-0.9 MG/10ML-% IV SOSY
PREFILLED_SYRINGE | INTRAVENOUS | Status: DC | PRN
  Administered 2022-07-18 (×2): 5 mg via INTRAVENOUS

## 2022-07-18 MED ORDER — ONDANSETRON HCL 4 MG/2ML IJ SOLN
INTRAMUSCULAR | Status: DC | PRN
  Administered 2022-07-18: 4 mg via INTRAVENOUS

## 2022-07-18 MED ORDER — SODIUM CHLORIDE 0.9 % IV SOLN: INTRAVENOUS | Status: DC

## 2022-07-18 MED ORDER — LOSARTAN POTASSIUM 25 MG PO TABS
25.0000 mg | ORAL_TABLET | Freq: Every day | ORAL | Status: DC
  Administered 2022-07-18: 25 mg via ORAL
  Filled 2022-07-18: qty 1

## 2022-07-18 MED ORDER — LACTATED RINGERS IV SOLN: Freq: Once | INTRAVENOUS | Status: AC

## 2022-07-18 MED ORDER — PROPOFOL 500 MG/50ML IV EMUL
INTRAVENOUS | Status: DC | PRN
  Administered 2022-07-18: 100 ug/kg/min via INTRAVENOUS

## 2022-07-18 SURGICAL SUPPLY — 15 items

## 2022-07-18 NOTE — Anesthesia Procedure Notes (Signed)
Procedure Name: MAC Date/Time: 07/18/2022 8:25 AM  Performed by: Griffin Dakin, CRNAPre-anesthesia Checklist: Patient identified, Emergency Drugs available, Suction available, Patient being monitored and Timeout performed Patient Re-evaluated:Patient Re-evaluated prior to induction Oxygen Delivery Method: Nasal cannula Placement Confirmation: positive ETCO2 and breath sounds checked- equal and bilateral Dental Injury: Teeth and Oropharynx as per pre-operative assessment  Comments: optiflow

## 2022-07-18 NOTE — Op Note (Signed)
[facility] Patient Name: Marissa Warner Procedure Date : 07/18/2022 MRN: [id number] Attending MD: Ronnette Juniper , MD, [id number] Date of Birth: [birth year] CSN: [id number] Age: 57 Admit Type: Inpatient Procedure:                Colonoscopy Indications:              Last colonoscopy: 2021, Rectal bleeding, Abnormal                            CT of the GI tract, thickening of cecum and                            rectosigmoid on CT?Crohn's Providers:                Ronnette Juniper, MD, Vladimir Crofts, RN, William Dalton,                            Technician Referring MD:             [facility] hospitalist Medicines:                Monitored Anesthesia Care Complications:            No immediate complications. Estimated blood loss:                            Minimal. Estimated Blood Loss:     Estimated blood loss was minimal. Procedure:                Pre-Anesthesia Assessment:                           - Prior to the procedure, a History and Physical                            was performed, and patient medications and                            allergies were reviewed. The patient's tolerance of                            previous anesthesia was also reviewed. The risks                            and benefits of the procedure and the sedation                            options and risks were discussed with the patient.                            All questions were answered, and informed consent                            was obtained. Prior Anticoagulants: The patient has                            taken  no anticoagulant or antiplatelet agents. ASA                            Grade Assessment: III - A patient with severe                            systemic disease. After reviewing the risks and                            benefits, the patient was deemed in satisfactory                            condition to undergo the procedure.                           - Prior to the procedure, a  History and Physical                            was performed, and patient medications and                            allergies were reviewed. The patient's tolerance of                            previous anesthesia was also reviewed. The risks                            and benefits of the procedure and the sedation                            options and risks were discussed with the patient.                            All questions were answered, and informed consent                            was obtained. Prior Anticoagulants: The patient has                            taken no anticoagulant or antiplatelet agents. ASA                            Grade Assessment: III - A patient with severe                            systemic disease. After reviewing the risks and                            benefits, the patient was deemed in satisfactory                            condition to undergo the procedure.  After obtaining informed consent, the colonoscope                            was passed under direct vision. Throughout the                            procedure, the patient's blood pressure, pulse, and                            oxygen saturations were monitored continuously. The                            [id number] Olympus peds colonoscope was                            introduced through the anus and advanced to the the                            terminal ileum. The colonoscopy was performed                            without difficulty. The patient tolerated the                            procedure well. The quality of the bowel                            preparation was fair. The terminal ileum, ileocecal                            valve, appendiceal orifice, and rectum were                            photographed. Scope In: 8:35:57 AM Scope Out: 8:51:45 AM Scope Withdrawal Time: 0 hours 9 minutes 50 seconds  Total Procedure Duration: 0 hours 15  minutes 48 seconds  Findings:      Skin tags were found on perianal exam.      Hemorrhoids were found on perianal exam.      The terminal ileum appeared normal.      A 5 mm polyp was found in the transverse colon. The polyp was sessile.       The polyp was removed with a hot snare. Resection and retrieval were       complete.      Multiple medium-mouthed diverticula were found in the recto-sigmoid       colon, sigmoid colon and descending colon.      Non-bleeding internal hemorrhoids were found during retroflexion. The       hemorrhoids were large.      The exam was otherwise without abnormality. Impression:               - Preparation of the colon was fair.                           - Perianal skin tags found on perianal exam.                           -  Hemorrhoids found on perianal exam.                           - The examined portion of the ileum was normal.                           - One 5 mm polyp in the transverse colon, removed                            with a hot snare. Resected and retrieved.                           - Diverticulosis in the recto-sigmoid colon, in the                            sigmoid colon and in the descending colon.                           - Non-bleeding internal hemorrhoids.                           - The examination was otherwise normal. Moderate Sedation:      Patient did not receive moderate sedation for this procedure, but       instead received monitored anesthesia care. Recommendation:           - High fiber diet.                           - Await pathology results.                           - Repeat colonoscopy for surveillance based on                            pathology results. Procedure Code(s):        --- Professional ---                           [phone], Colonoscopy, flexible; with removal of                            tumor(s), polyp(s), or other lesion(s) by snare                            technique Diagnosis Code(s):        ---  Professional ---                           K64.8, Other hemorrhoids                           D12.3, Benign neoplasm of transverse colon (hepatic                            flexure or splenic flexure)  K64.4, Residual hemorrhoidal skin tags                           K62.5, Hemorrhage of anus and rectum                           K57.30, Diverticulosis of large intestine without                            perforation or abscess without bleeding                           R93.3, Abnormal findings on diagnostic imaging of                            other parts of digestive tract CPT copyright 2022 [employer]. All rights reserved. The codes documented in this report are preliminary and upon coder review may  be revised to meet current compliance requirements. Ronnette Juniper, MD 07/18/2022 9:06:19 AM This report has been signed electronically. Number of Addenda: 0

## 2022-07-18 NOTE — Transfer of Care (Signed)
Immediate Anesthesia Transfer of Care Note  Patient: Marissa Warner  Procedure(s) Performed: ESOPHAGOGASTRODUODENOSCOPY (EGD) WITH PROPOFOL COLONOSCOPY  Patient Location: PACU  Anesthesia Type:MAC  Level of Consciousness: awake, alert , and oriented  Airway & Oxygen Therapy: Patient Spontanous Breathing  Post-op Assessment: Report given to RN and Post -op Vital signs reviewed and stable  Post vital signs: Reviewed and stable  Last Vitals:  Vitals Value Taken Time  BP 112/61 07/18/22 0900  Temp 36.6 C 07/18/22 0900  Pulse 85 07/18/22 0900  Resp 21 07/18/22 0900  SpO2 100 % 07/18/22 0900    Last Pain:  Vitals:   07/18/22 0800  TempSrc: Temporal  PainSc: 0-No pain         Complications: No notable events documented.

## 2022-07-18 NOTE — Discharge Summary (Signed)
Physician Discharge Summary  Marissa Warner [id number] DOB: 1965-12-23 DOA: 07/14/2022  PCP: Nolene Ebbs, MD  Admit date: 07/14/2022 Discharge date: 07/18/2022  Admitted From: Home Disposition:  home  Recommendations for Outpatient Follow-up:  Follow up with GI in 1-2 weeks Please obtain BMP/CBC in one week Will need to follow-up with [facility] or [facility] as an outpatient for surgical evaluation of her hepatocellular single lesion carcinoma  Home Health:No Equipment/Devices:none  Discharge Condition:Stable CODE STATUS:Full Diet recommendation: Heart Healthy   Brief/Interim Summary:  57 y.o. female past medical history of essential hypertension, anemia came into the emergency room for painless rectal bleeding GI was consulted    Discharge Diagnoses:  Principal Problem:   Acute lower GI bleeding Active Problems:   Essential hypertension   Acute on chronic blood loss anemia   GERD (gastroesophageal reflux disease)   Allergic rhinitis   Migraines   Hepatocellular carcinoma ([place]) Painless acute lower GI bleed: Likely due to diverticular bleed EGD was done that showed no acute findings bleeding scan was negative. GI recommended colonoscopy that showed hemorrhoids and diverticulosis. She was transfused 2 unit packed red blood cells her hemoglobin this morning is around 9.  Incidental hepatocellular carcinoma: MRI of the abdomen showed a 1.6 cm mass in the right hepatic lobe. GI discussed with the patient to refer her to [facility] or [facility] for outpatient evaluation for surgical intervention. Her alpha-fetoprotein was 2.5.  Essential hypertension: Resume antihypertensive medications and outpatient changes made.  Cirrhosis of the liver/hepatitis B: Confirmed by ultrasound GI will follow-up as an outpatient.  Chronic hepatitis B: Hepatitis PCR DNA was send she will follow-up with [facility] or [facility] as an outpatient.    Discharge Instructions  Discharge Instructions      Diet - low sodium heart healthy   Complete by: As directed    Increase activity slowly   Complete by: As directed       Allergies as of 07/18/2022   No Known Allergies      Medication List     STOP taking these medications    prochlorperazine 10 MG tablet Commonly known as: COMPAZINE   prochlorperazine 25 MG suppository Commonly known as: COMPAZINE       TAKE these medications    acetaminophen 500 MG tablet Commonly known as: TYLENOL Take 500 mg by mouth every 6 (six) hours as needed for mild pain.   carvedilol 6.25 MG tablet Commonly known as: COREG Take 1 tablet (6.25 mg total) by mouth 2 (two) times daily.   diclofenac Sodium 1 % Gel Commonly known as: VOLTAREN Apply 4 g topically 4 (four) times daily. What changed:  when to take this reasons to take this   fluticasone 50 MCG/ACT nasal spray Commonly known as: FLONASE Place 1 spray into both nostrils daily.   losartan 25 MG tablet Commonly known as: COZAAR Take 25 mg by mouth daily.   Vitamin D (Ergocalciferol) 1.25 MG (50000 UNIT) Caps capsule Commonly known as: DRISDOL Take 50,000 Units by mouth every 7 (seven) days.        No Known Allergies  Consultations: Gastroenterology   Procedures/Studies: MR ABDOMEN W WO CONTRAST  Result Date: 07/16/2022 CLINICAL DATA:  Cirrhosis.  Hepatic mass on recent ultrasound. EXAM: MRI ABDOMEN WITHOUT AND WITH CONTRAST TECHNIQUE: Multiplanar multisequence MR imaging of the abdomen was performed both before and after the administration of intravenous contrast. CONTRAST:  45m GADAVIST GADOBUTROL 1 MMOL/ML IV SOLN COMPARISON:  Ultrasound on 07/15/2022 FINDINGS: Lower chest: No acute  findings. Hepatobiliary: Findings of hepatic cirrhosis are seen. Recanalization paraumbilical veins is consistent with portal venous hypertension. A mass is seen in segment 8 of the right lobe which measures 1.6 x 1.5 cm, and shows arterial phase hyperenhancement, contrast out  washout, and delayed peripheral rim enhancement. Several other sub-centimeter foci of arterial phase hyperenhancement also seen in the right hepatic lobe, which are more difficult to characterize due to their small size. These lesions show no definite contrast washout or delayed peripheral rim enhancement. Prior cholecystectomy. No evidence of biliary obstruction. Pancreas:  No mass or inflammatory changes. Spleen:  Within normal limits in size and appearance. Adrenals/Urinary Tract: No suspicious masses identified. A tiny benign Bosniak category 2 hemorrhagic cyst is seen in the lateral midpole the left kidney. (no followup imaging is recommended). No evidence of hydronephrosis. Stomach/Bowel: Left-sided colonic diverticulosis, without evidence of diverticulitis in this region. Diffuse mesenteric and body wall edema, without ascites. Vascular/Lymphatic: No pathologically enlarged lymph nodes identified. No acute vascular findings. Left upper quadrant portosystemic collaterals, consistent with portal venous hypertension. Other:  None. Musculoskeletal:  No suspicious bone lesions identified. IMPRESSION: Hepatic cirrhosis and findings of portal venous hypertension. 1.6 cm mass in segment 8 of the right hepatic lobe, which is diagnostic for hepatocellular carcinoma. (LI-RADS Category 5: Definitely hepatocellular carcinoma). Several other sub-centimeter foci of arterial phase hyperenhancement in the right hepatic lobe, which show no contrast washout or peripheral capsular enhancement. (LI-RADS Category 3: Intermediate probability of malignancy). Recommend continued follow-up by abdomen MRI without and with contrast in 6 months. No evidence of metastatic disease. Electronically Signed   By: Marlaine Hind M.D.   On: 07/16/2022 17:02   US Abdomen Complete  Result Date: 07/15/2022 CLINICAL DATA:  Cirrhosis EXAM: ABDOMEN ULTRASOUND COMPLETE COMPARISON:  CT 07/14/2022 FINDINGS: Gallbladder: Surgically absent. Common bile  duct: Diameter: 2.5 mm, normal. No intrahepatic ductal dilation. Liver: There is a 1.7 x 1.5 x 1.9 cm hypoechoic mass in the right hepatic lobe. Questionable internal vascularity. Mildly nodular liver contours. Portal vein is patent on color Doppler imaging with normal direction of blood flow towards the liver. IVC: No abnormality visualized. Pancreas: Visualized portion unremarkable. Spleen: Size and appearance within normal limits. Right Kidney: Length: 10.0 cm. Echogenicity within normal limits. No mass or hydronephrosis visualized. Left Kidney: Length: 10.4 cm. Echogenicity is within normal limits. No hydronephrosis. There is a 1.4 cm cyst in the upper pole which is better evaluated on prior CTA of the abdomen and is likely a simple cyst. Prominent column of Bertin Abdominal aorta: No aneurysm visualized. Other findings: None. IMPRESSION: 1.9 cm hypoechoic mass in right hepatic lobe. Mildly nodular liver contours, which could suggest underlying chronic liver disease/possible cirrhosis. Recommend non-emergent MRI with and without contrast as an outpatient to assess the right hepatic lobe mass, when the patient is stable and able to follow breathing instructions. Electronically Signed   By: Maurine Simmering M.D.   On: 07/15/2022 16:44   CT ANGIO GI BLEED  Result Date: 07/14/2022 CLINICAL DATA:  Follow-up abdominal aortic aneurysm. Vaginal bleeding. EXAM: CTA ABDOMEN AND PELVIS WITHOUT AND WITH CONTRAST TECHNIQUE: Multidetector CT imaging of the abdomen and pelvis was performed using the standard protocol during bolus administration of intravenous contrast. Multiplanar reconstructed images and MIPs were obtained and reviewed to evaluate the vascular anatomy. RADIATION DOSE REDUCTION: This exam was performed according to the departmental dose-optimization program which includes automated exposure control, adjustment of the mA and/or kV according to patient size and/or use of iterative reconstruction  technique.  CONTRAST:  14m OMNIPAQUE IOHEXOL 350 MG/ML SOLN COMPARISON:  01/26/2022 FINDINGS: VASCULAR Aorta: Unenhanced images demonstrate no significant vascular calcifications. Surgical clips in the right upper quadrant consistent with cholecystectomy. Surgical clips in the pelvis consistent with tubal ligations. Normal caliber abdominal aorta. No aneurysm or dissection. No significant stenosis. Celiac: Patent without evidence of aneurysm, dissection, vasculitis or significant stenosis. SMA: Patent without evidence of aneurysm, dissection, vasculitis or significant stenosis. Renals: Both renal arteries are patent without evidence of aneurysm, dissection, vasculitis, fibromuscular dysplasia or significant stenosis. IMA: Patent without evidence of aneurysm, dissection, vasculitis or significant stenosis. Inflow: Patent without evidence of aneurysm, dissection, vasculitis or significant stenosis. Proximal Outflow: Bilateral common femoral and visualized portions of the superficial and profunda femoral arteries are patent without evidence of aneurysm, dissection, vasculitis or significant stenosis. Veins: No obvious venous abnormality within the limitations of this arterial phase study. Review of the MIP images confirms the above findings. NON-VASCULAR Lower chest: Trace left pleural effusion with basilar atelectasis. Small esophageal hiatal hernia. Hepatobiliary: No focal liver abnormality is seen. Status post cholecystectomy. No biliary dilatation. Pancreas: Unremarkable. No pancreatic ductal dilatation or surrounding inflammatory changes. Spleen: Normal in size without focal abnormality. Adrenals/Urinary Tract: Adrenal glands are unremarkable. Kidneys are normal, without renal calculi, focal lesion, or hydronephrosis. Bladder is unremarkable. Stomach/Bowel: Stomach, small bowel, and colon are not abnormally distended. There is evidence of some wall thickening in the colon with areas including the cecum, sigmoid colon, and  rectum involved. This may represent inflammatory process such as Crohn disease or other colitis. Diverticulosis of the sigmoid colon without evidence of acute diverticulitis. Appendix is normal. No evidence of any focal area of intraluminal contrast opacification, suggesting no identified areas of active extravasation/gastrointestinal bleeding. Lymphatic: No significant lymphadenopathy. Reproductive: Surgical absence of the uterus. No abnormal adnexal masses. Other: No free air or free fluid in the abdomen. Abdominal wall musculature appears intact. Musculoskeletal: Degenerative changes in the spine. IMPRESSION: VASCULAR Normal abdominal aorta. No evidence of aneurysm or dissection. No large vessel occlusion or acute abnormality. NON-VASCULAR 1. No evidence of active contrast extravasation to suggest a focal site of gastrointestinal bleeding. 2. Colonic wall thickening involving the rectosigmoid and cecal regions suggesting colitis, possibly Crohn disease. 3. Trace left pleural effusion with basilar atelectasis. Electronically Signed   By: WLucienne CapersM.D.   On: 07/14/2022 21:38   UKoreaPELVIC COMPLETE WITH TRANSVAGINAL  Result Date: 07/14/2022 CLINICAL DATA:  [id number] bleeding [id number]: ULTRASOUND OF PELVIS TECHNIQUE: Transabdominal and transvaginalultrasound examination of the pelvis was performed including evaluation of the uterus, ovaries, adnexal regions, and pelvic cul-de-sac. COMPARISON:  None Available. FINDINGS: Patient is status post hysterectomy. Ovaries are not identified. No masses or fluid collections are seen. IMPRESSION: Status post hysterectomy. No adnexal pathology. Ovaries not identified. Electronically Signed   By: JSammie BenchM.D.   On: 07/14/2022 17:02   (Echo, Carotid, EGD, Colonoscopy, ERCP)    Subjective: No complaints  Discharge Exam: Vitals:   07/18/22 0900 07/18/22 0915  BP: 112/61 113/66  Pulse: 85 80  Resp: (!) 21 19  Temp: 97.8 F (36.6 C) 97.8 F  (36.6 C)  SpO2: 100% 100%   Vitals:   07/18/22 0427 07/18/22 0800 07/18/22 0900 07/18/22 0915  BP: (!) 101/56 111/63 112/61 113/66  Pulse: 79 69 85 80  Resp: 16 20 (!) 21 19  Temp: 97.9 F (36.6 C) (!) 97.5 F (36.4 C) 97.8 F (36.6 C) 97.8 F (36.6 C)  TempSrc: Oral Temporal  SpO2: 100% 100% 100% 100%  Weight:      Height:        General: Pt is alert, awake, not in acute distress Cardiovascular: RRR, S1/S2 +, no rubs, no gallops Respiratory: CTA bilaterally, no wheezing, no rhonchi Abdominal: Soft, NT, ND, bowel sounds + Extremities: no edema, no cyanosis    The results of significant diagnostics from this hospitalization (including imaging, microbiology, ancillary and laboratory) are listed below for reference.     Microbiology: No results found for this or any previous visit (from the past 240 hour(s)).   Labs: BNP (last 3 results) No results for input(s): "BNP" in the last 8760 hours. Basic Metabolic Panel: Recent Labs  Lab 07/14/22 1309 07/15/22 0020  NA 135 133*  K 3.7 3.8  CL 106 107  CO2 24 21*  GLUCOSE 134* 121*  BUN 8 7  CREATININE 1.27* 1.16*  CALCIUM 8.5* 8.1*  MG 1.8 1.7   Liver Function Tests: Recent Labs  Lab 07/14/22 1309 07/15/22 0020  AST 58* 54*  ALT 25 21  ALKPHOS 80 85  BILITOT 2.0* 1.8*  PROT 7.5 7.4  ALBUMIN 2.0* 1.9*   No results for input(s): "LIPASE", "AMYLASE" in the last 168 hours. No results for input(s): "AMMONIA" in the last 168 hours. CBC: Recent Labs  Lab 07/14/22 1310 07/15/22 0020 07/15/22 1650 07/16/22 0627 07/16/22 1259 07/16/22 1751 07/17/22 0622 07/17/22 1728 07/18/22 0634  WBC 3.7* 4.1  --  3.1*  --   --   --   --   --   NEUTROABS 2.3 2.0  --   --   --   --   --   --   --   HGB 7.5* 7.6*   < > 6.6* 7.9* 8.9* 7.3* 9.7* 8.8*  HCT 22.7* 22.4*   < > 18.8* 22.9* 25.4* 20.6* 28.8* 24.9*  MCV 79.9* 78.6*  --  76.4*  --   --   --   --   --   PLT 90* 81*  --  75*  --   --   --   --   --    < > =  values in this interval not displayed.   Cardiac Enzymes: No results for input(s): "CKTOTAL", "CKMB", "CKMBINDEX", "TROPONINI" in the last 168 hours. BNP: Invalid input(s): "POCBNP" CBG: No results for input(s): "GLUCAP" in the last 168 hours. D-Dimer No results for input(s): "DDIMER" in the last 72 hours. Hgb A1c No results for input(s): "HGBA1C" in the last 72 hours. Lipid Profile No results for input(s): "CHOL", "HDL", "LDLCALC", "TRIG", "CHOLHDL", "LDLDIRECT" in the last 72 hours. Thyroid function studies No results for input(s): "TSH", "T4TOTAL", "T3FREE", "THYROIDAB" in the last 72 hours.  Invalid input(s): "FREET3" Anemia work up No results for input(s): "VITAMINB12", "FOLATE", "FERRITIN", "TIBC", "IRON", "RETICCTPCT" in the last 72 hours. Urinalysis    Component Value Date/Time   COLORURINE AMBER (A) 01/25/2022 2249   APPEARANCEUR HAZY (A) 01/25/2022 2249   LABSPEC 1.020 01/25/2022 2249   PHURINE 5.0 01/25/2022 2249   GLUCOSEU NEGATIVE 01/25/2022 2249   HGBUR NEGATIVE 01/25/2022 2249   BILIRUBINUR NEGATIVE 01/25/2022 2249   BILIRUBINUR small (1+) 12/31/2018 1343   KETONESUR NEGATIVE 01/25/2022 2249   PROTEINUR 30 (A) 01/25/2022 2249   UROBILINOGEN 0.2 12/31/2018 1343   NITRITE NEGATIVE 01/25/2022 2249   LEUKOCYTESUR SMALL (A) 01/25/2022 2249   Sepsis Labs Recent Labs  Lab 07/14/22 1310 07/15/22 0020 07/16/22 0627  WBC 3.7* 4.1 3.1*   Microbiology  No results found for this or any previous visit (from the past 240 hour(s)).   SIGNED:   Charlynne Cousins, MD  [facility] 07/18/2022, 10:08 AM Pager   If 7PM-7AM, please contact night-coverage [email or web address] Password [email or web address]

## 2022-07-18 NOTE — Interval H&P Note (Signed)
History and Physical Interval Note: 56/female with hepatitis B, cirrhosis, HCC, with rectal bleeding, abnormal CT for EGD with possible banding and colonoscopy with propofol.  07/18/2022 8:14 AM  Marissa Warner  has presented today for EGD with possible banding and colonoscopy, with the diagnosis of rectal bleeding, anemia, suspected cirrhosis.  The various methods of treatment have been discussed with the patient and family. After consideration of risks, benefits and other options for treatment, the patient has consented to  Procedure(s): ESOPHAGOGASTRODUODENOSCOPY (EGD) WITH PROPOFOL (N/A) COLONOSCOPY (N/A) as a surgical intervention.  The patient's history has been reviewed, patient examined, no change in status, stable for surgery.  I have reviewed the patient's chart and labs.  Questions were answered to the patient's satisfaction.     Ronnette Juniper

## 2022-07-18 NOTE — Plan of Care (Signed)

## 2022-07-18 NOTE — Progress Notes (Signed)
Pt ready for DC to home, will call ride. Reviewed and wrote out all follow up appts for pt for GI in 1-2 weeks ( with lab work), for her PCP Dr. Jeanie Cooks which she already had for 3/18 and to follow up with [facility] or [facility] as per GI recommendations. Pt understands all instructions, meds and follow up.

## 2022-07-18 NOTE — Telephone Encounter (Signed)
Scheduled appt per 2/26 referral. Pt is aware of appt date and time. Pt is aware to arrive 15 mins prior to appt time and to bring and updated insurance card. Pt is aware of appt location.

## 2022-07-18 NOTE — Op Note (Signed)
[facility] Patient Name: Marissa Warner Procedure Date : 07/18/2022 MRN: [id number] Attending MD: Ronnette Juniper , MD, [id number] Date of Birth: 1965/10/21 CSN: [id number] Age: 57 Admit Type: Inpatient Procedure:                Upper GI endoscopy Indications:              Cirrhosis rule out esophageal varices,                            epiastaxis/hematemesis Providers:                Ronnette Juniper, MD, Vladimir Crofts, RN, William Dalton,                            Technician Referring MD:             [facility] Medicines:                Monitored Anesthesia Care Complications:            No immediate complications. Estimated Blood Loss:     Estimated blood loss: none. Procedure:                Pre-Anesthesia Assessment:                           - Prior to the procedure, a History and Physical                            was performed, and patient medications and                            allergies were reviewed. The patient's tolerance of                            previous anesthesia was also reviewed. The risks                            and benefits of the procedure and the sedation                            options and risks were discussed with the patient.                            All questions were answered, and informed consent                            was obtained. Prior Anticoagulants: The patient has                            taken no anticoagulant or antiplatelet agents. ASA                            Grade Assessment: III - A patient with severe                            systemic  disease. After reviewing the risks and                            benefits, the patient was deemed in satisfactory                            condition to undergo the procedure.                           After obtaining informed consent, the endoscope was                            passed under direct vision. Throughout the                            procedure, the patient's  blood pressure, pulse, and                            oxygen saturations were monitored continuously. The                            [id number]) Olympus endoscope was introduced                            through the mouth, and advanced to the second part                            of duodenum. The upper GI endoscopy was                            accomplished without difficulty. The patient                            tolerated the procedure well. Scope In: Scope Out: Findings:      The examined esophagus was normal.      The entire examined stomach was normal.      The examined duodenum was normal.      A 2 cm hiatal hernia was present. Impression:               - Normal esophagus.                           - Normal stomach.                           - Normal examined duodenum.                           - 2 cm hiatal hernia.                           - No specimens collected. Moderate Sedation:      Patient did not receive moderate sedation for this procedure, but       instead received monitored anesthesia care. Recommendation:           - Resume regular  diet. Procedure Code(s):        --- Professional ---                           [phone], Esophagogastroduodenoscopy, flexible,                            transoral; diagnostic, including collection of                            specimen(s) by brushing or washing, when performed                            (separate procedure) Diagnosis Code(s):        --- Professional ---                           K44.9, Diaphragmatic hernia without obstruction or                            gangrene                           K74.60, Unspecified cirrhosis of liver CPT copyright 2022 American [employer]. All rights reserved. The codes documented in this report are preliminary and upon coder review may  be revised to meet current compliance requirements. Ronnette Juniper, MD 07/18/2022 9:03:16 AM This report has been signed  electronically. Number of Addenda: 0

## 2022-07-18 NOTE — Anesthesia Postprocedure Evaluation (Signed)
Anesthesia Post Note  Patient: Marissa Warner  Procedure(s) Performed: ESOPHAGOGASTRODUODENOSCOPY (EGD) WITH PROPOFOL COLONOSCOPY POLYPECTOMY     Patient location during evaluation: PACU Anesthesia Type: MAC Level of consciousness: awake Pain management: pain level controlled Vital Signs Assessment: post-procedure vital signs reviewed and stable Respiratory status: spontaneous breathing, nonlabored ventilation and respiratory function stable Cardiovascular status: stable and blood pressure returned to baseline Postop Assessment: no apparent nausea or vomiting Anesthetic complications: no   No notable events documented.  Last Vitals:  Vitals:   07/18/22 0900 07/18/22 0915  BP: 112/61 113/66  Pulse: 85 80  Resp: (!) 21 19  Temp: 36.6 C 36.6 C  SpO2: 100% 100%    Last Pain:  Vitals:   07/18/22 0915  TempSrc:   PainSc: 0-No pain                 Nilda Simmer

## 2022-07-19 ENCOUNTER — Encounter (HOSPITAL_COMMUNITY): Payer: Self-pay | Admitting: Gastroenterology

## 2022-07-19 LAB — SURGICAL PATHOLOGY

## 2022-07-27 ENCOUNTER — Inpatient Hospital Stay: Payer: Medicaid Other | Attending: Nurse Practitioner | Admitting: Nurse Practitioner

## 2022-07-27 ENCOUNTER — Other Ambulatory Visit: Payer: Self-pay

## 2022-07-27 ENCOUNTER — Other Ambulatory Visit: Payer: Self-pay | Admitting: *Deleted

## 2022-07-27 ENCOUNTER — Inpatient Hospital Stay: Payer: Medicaid Other

## 2022-07-27 ENCOUNTER — Encounter: Payer: Self-pay | Admitting: *Deleted

## 2022-07-27 ENCOUNTER — Encounter: Payer: Self-pay | Admitting: Radiology

## 2022-07-27 ENCOUNTER — Encounter: Payer: Self-pay | Admitting: Nurse Practitioner

## 2022-07-27 VITALS — BP 123/57 | HR 84 | Temp 97.5°F | Resp 20 | Wt 184.4 lb

## 2022-07-27 DIAGNOSIS — K219 Gastro-esophageal reflux disease without esophagitis: Secondary | ICD-10-CM | POA: Insufficient documentation

## 2022-07-27 DIAGNOSIS — C220 Liver cell carcinoma: Secondary | ICD-10-CM

## 2022-07-27 DIAGNOSIS — N189 Chronic kidney disease, unspecified: Secondary | ICD-10-CM | POA: Insufficient documentation

## 2022-07-27 DIAGNOSIS — Z79899 Other long term (current) drug therapy: Secondary | ICD-10-CM | POA: Diagnosis not present

## 2022-07-27 DIAGNOSIS — K7460 Unspecified cirrhosis of liver: Secondary | ICD-10-CM | POA: Insufficient documentation

## 2022-07-27 DIAGNOSIS — B1910 Unspecified viral hepatitis B without hepatic coma: Secondary | ICD-10-CM | POA: Diagnosis not present

## 2022-07-27 DIAGNOSIS — D649 Anemia, unspecified: Secondary | ICD-10-CM | POA: Diagnosis not present

## 2022-07-27 DIAGNOSIS — I129 Hypertensive chronic kidney disease with stage 1 through stage 4 chronic kidney disease, or unspecified chronic kidney disease: Secondary | ICD-10-CM | POA: Insufficient documentation

## 2022-07-27 DIAGNOSIS — F17290 Nicotine dependence, other tobacco product, uncomplicated: Secondary | ICD-10-CM

## 2022-07-27 LAB — FERRITIN: Ferritin: 751 ng/mL — ABNORMAL HIGH (ref 11–307)

## 2022-07-27 LAB — CBC WITH DIFFERENTIAL (CANCER CENTER ONLY)
Abs Immature Granulocytes: 0.01 10*3/uL (ref 0.00–0.07)
Basophils Absolute: 0 10*3/uL (ref 0.0–0.1)
Basophils Relative: 1 %
Eosinophils Absolute: 0.2 10*3/uL (ref 0.0–0.5)
Eosinophils Relative: 3 %
HCT: 26.8 % — ABNORMAL LOW (ref 36.0–46.0)
Hemoglobin: 9.4 g/dL — ABNORMAL LOW (ref 12.0–15.0)
Immature Granulocytes: 0 %
Lymphocytes Relative: 24 %
Lymphs Abs: 1.1 10*3/uL (ref 0.7–4.0)
MCH: 27.7 pg (ref 26.0–34.0)
MCHC: 35.1 g/dL (ref 30.0–36.0)
MCV: 79.1 fL — ABNORMAL LOW (ref 80.0–100.0)
Monocytes Absolute: 0.7 10*3/uL (ref 0.1–1.0)
Monocytes Relative: 14 %
Neutro Abs: 2.7 10*3/uL (ref 1.7–7.7)
Neutrophils Relative %: 58 %
Platelet Count: 85 10*3/uL — ABNORMAL LOW (ref 150–400)
RBC: 3.39 MIL/uL — ABNORMAL LOW (ref 3.87–5.11)
RDW: 18.7 % — ABNORMAL HIGH (ref 11.5–15.5)
WBC Count: 4.7 10*3/uL (ref 4.0–10.5)
nRBC: 0 % (ref 0.0–0.2)

## 2022-07-27 LAB — CMP (CANCER CENTER ONLY)
ALT: 13 U/L (ref 0–44)
AST: 37 U/L (ref 15–41)
Albumin: 2.4 g/dL — ABNORMAL LOW (ref 3.5–5.0)
Alkaline Phosphatase: 81 U/L (ref 38–126)
Anion gap: 3 — ABNORMAL LOW (ref 5–15)
BUN: 10 mg/dL (ref 6–20)
CO2: 26 mmol/L (ref 22–32)
Calcium: 8.2 mg/dL — ABNORMAL LOW (ref 8.9–10.3)
Chloride: 109 mmol/L (ref 98–111)
Creatinine: 0.89 mg/dL (ref 0.44–1.00)
GFR, Estimated: >60 mL/min (ref >60)
Glucose, Bld: 87 mg/dL (ref 70–99)
Potassium: 3.4 mmol/L — ABNORMAL LOW (ref 3.5–5.1)
Sodium: 138 mmol/L (ref 135–145)
Total Bilirubin: 1.9 mg/dL — ABNORMAL HIGH (ref 0.3–1.2)
Total Protein: 7.9 g/dL (ref 6.5–8.1)

## 2022-07-27 LAB — RESEARCH LABS

## 2022-07-27 NOTE — Progress Notes (Addendum)
[facility]   [phone] Fax:(336) [phone]   Clinic New Consult Note   Patient Care Team: Nolene Ebbs, MD as PCP - General (Internal Medicine) Donato Heinz, MD as PCP - Cardiology (Cardiology) 07/27/2022  CHIEF COMPLAINTS/PURPOSE OF CONSULTATION:  Hepatocellular carcinoma, referred by GI Dr. Ronnette Juniper  HISTORY OF PRESENTING ILLNESS:  Marissa Warner 57 y.o. female with PMH including HTN, GERD, migraines, CKD, and anemia is here because of newly diagnosed Roberts.  She is followed by liver and transplant clinic NP Dawn a drastic for hepatitis B, diagnosed during ER visit in 01/2022 when she was found to have transaminitis (AST 1000, ALT 300, Alk phos 142, and Tbili 3.6) and a positive hep B surface antigen and hep B, C IgM. She was prescribed entecavir. She presented back to ED 07/14/22 with rectal bleeding, work up showed acute on chronic anemia hgb 7.5 (down from 9.5 - 12 baseline) and thrombocytopenia plt 90. She received 2 units RBCs. +FOBT. Ferritin was elevated 555. GI angio was negative but showed colonic wall thickening involving the rectosigmoid and cecal regions suggesting colitis. No liver lesion was seen. An ABD [place] was obtained 2/23 which showed a 1.7 x 1.5 x 1.9 cm hypoechoic mass in the right hepatic lobe and nodular liver contours.  Abdominal MRI 07/16/2022 showed cirrhosis and a mass in segment 8 of the right lobe measuring 1.6 x 1.5 cm with arterial phase hyperenhancement, contrast washout, and delayed peripheral rim enhancement consistent with LI-RADS 5 lesion which is diagnostic for HCC. There were several other subcentimeter foci of arterial phase hyperenhancement seen in the right lobe are indeterminate for HCC (LI-RADS 3).   Socially, she is single, 4 children, not currently working.  She has a driver's license but does not drive.  Uses transportation through [facility] or family.  She chews tobacco every other day for the past 10 years, going  alcohol in her 59s and 39s but none recently, and denies other drug use.  He denies family history of cancer.  Today she presents by herself, feels well in general.  She has 5-6 pounds weight loss in the past year.  Appetite is normal.  She is active at home, with housework and errands, and walks 3 times a day.  She denies recent bleeding since she left the hospital.  Denies abdominal pain/bloating, nausea/vomiting, diarrhea.  She had 1 episode of constipation since she left the hospital, taking MiraLAX.  MEDICAL HISTORY:  Past Medical History:  Diagnosis Date   Allergy    environmental and seasonal   Blood transfusion without reported diagnosis before 2004   due to heavy periods   Hepatitis B    Hypertension    Migraines 2007   Renal disorder    Pt. states "Stage III Kidney disease"  Never had kidney biopsy.  She is unable to tell me what the cause of the kidney disease.  Reportedly diagnosed with [facility] in [place].    SURGICAL HISTORY: Past Surgical History:  Procedure Laterality Date   ABDOMINAL HYSTERECTOMY  2004   With BSO, she thinks   BIOPSY  09/09/2019   Procedure: BIOPSY;  Surgeon: Ronnette Juniper, MD;  Location: [facility];  Service: Gastroenterology;;   CHOLECYSTECTOMY  uncertain date   Laparoscopic   COLONOSCOPY N/A 07/18/2022   Procedure: COLONOSCOPY;  Surgeon: Ronnette Juniper, MD;  Location: [facility];  Service: Gastroenterology;  Laterality: N/A;   COLONOSCOPY WITH PROPOFOL N/A 09/09/2019   Procedure: COLONOSCOPY WITH PROPOFOL;  Surgeon:  Ronnette Juniper, MD;  Location: [facility];  Service: Gastroenterology;  Laterality: N/A;   ESOPHAGOGASTRODUODENOSCOPY (EGD) WITH PROPOFOL N/A 09/09/2019   Procedure: ESOPHAGOGASTRODUODENOSCOPY (EGD) WITH PROPOFOL;  Surgeon: Ronnette Juniper, MD;  Location: [facility];  Service: Gastroenterology;  Laterality: N/A;   ESOPHAGOGASTRODUODENOSCOPY (EGD) WITH PROPOFOL N/A 07/18/2022   Procedure: ESOPHAGOGASTRODUODENOSCOPY (EGD) WITH  PROPOFOL;  Surgeon: Ronnette Juniper, MD;  Location: [facility];  Service: Gastroenterology;  Laterality: N/A;   LAPAROSCOPIC INCISIONAL / UMBILICAL / [place] REPAIR  2000   POLYPECTOMY  07/18/2022   Procedure: POLYPECTOMY;  Surgeon: Ronnette Juniper, MD;  Location: [facility];  Service: Gastroenterology;;    SOCIAL HISTORY: Social History   Socioeconomic History   Marital status: Single    Spouse name: Not on file   Number of children: 4   Years of education: 12   Highest education level: Not on file  Occupational History   Occupation: unemployed    Comment: Previously in housekeeping  Tobacco Use   Smoking status: Never   Smokeless tobacco: Current    Types: Chew   Tobacco comments:    down to once daily or every other day  Substance and Sexual Activity   Alcohol use: Not Currently   Drug use: No   Sexual activity: Never    Partners: Male  Other Topics Concern   Not on file  Social History Narrative   Originally from [place], [place]   Moved to [place] in March 2017 to be near daughter.   Lives alone near [place].   Social Determinants of Health   Financial Resource Strain: Not on file  Food Insecurity: No Food Insecurity (07/15/2022)   Hunger Vital Sign    Worried About Running Out of Food in the Last Year: Never true    Ran Out of Food in the Last Year: Never true  Transportation Needs: No Transportation Needs (07/15/2022)   PRAPARE - Hydrologist (Medical): No    Lack of Transportation (Non-Medical): No  Physical Activity: Not on file  Stress: Not on file  Social Connections: Not on file  Intimate Partner Violence: Not At Risk (07/15/2022)   Humiliation, Afraid, Rape, and Kick questionnaire    Fear of Current or Ex-Partner: No    Emotionally Abused: No    Physically Abused: No    Sexually Abused: No    FAMILY HISTORY: History reviewed. No pertinent family history.  ALLERGIES:  has No  Known Allergies.  MEDICATIONS:  Current Outpatient Medications  Medication Sig Dispense Refill   acetaminophen (TYLENOL) 500 MG tablet Take 500 mg by mouth every 6 (six) hours as needed for mild pain.     carvedilol (COREG) 6.25 MG tablet Take 1 tablet (6.25 mg total) by mouth 2 (two) times daily. 180 tablet 3   diclofenac Sodium (VOLTAREN) 1 % GEL Apply 4 g topically 4 (four) times daily. (Patient taking differently: Apply 4 g topically 4 (four) times daily as needed (Pain).) 100 g 11   fluticasone (FLONASE) 50 MCG/ACT nasal spray Place 1 spray into both nostrils daily.     losartan (COZAAR) 25 MG tablet Take 25 mg by mouth daily.     Vitamin D, Ergocalciferol, (DRISDOL) 1.25 MG (50000 UNIT) CAPS capsule Take 50,000 Units by mouth every 7 (seven) days.     polyethylene glycol powder (GLYCOLAX/MIRALAX) 17 GM/SCOOP powder Take by mouth.     No current facility-administered medications for this visit.    REVIEW  OF SYSTEMS:   Constitutional: Denies fevers, chills or abnormal night sweats (+)-6 pounds weight loss in the last year Eyes: Denies blurriness of vision, double vision or watery eyes Ears, nose, mouth, throat, and face: Denies mucositis or sore throat Respiratory: Denies cough, dyspnea or wheezes Cardiovascular: Denies palpitation, chest discomfort or lower extremity swelling Gastrointestinal:  Denies nausea, pain, diarrhea, pain/bloating, heartburn or change in bowel habits (+) constipation Skin: Denies abnormal skin rashes (+) bruising  Lymphatics: Denies new lymphadenopathy or easy bruising Neurological:Denies numbness, tingling or new weaknesses Behavioral/Psych: Mood is stable, no new changes  All other systems were reviewed with the patient and are negative.  PHYSICAL EXAMINATION: ECOG PERFORMANCE STATUS: 0 - Asymptomatic  Vitals:   07/27/22 1323  BP: (!) 123/57  Pulse: 84  Resp: 20  Temp: (!) 97.5 F (36.4 C)  SpO2: 100%   Filed Weights   07/27/22 1323  Weight:  184 lb 6.4 oz (83.6 kg)    GENERAL:alert, no distress and comfortable SKIN: Ecchymoses to bilateral forearms. No rash  EYES: sclera clear NECK: without mass LYMPH:  no palpable cervical or supraclavicular lymphadenopathy LUNGS: clear with normal breathing effort HEART: regular rate & rhythm, no lower extremity edema ABDOMEN:abdomen soft, non-tender and normal bowel sounds Musculoskeletal:no cyanosis of digits and no clubbing  PSYCH: alert & oriented x 3 with fluent speech NEURO: no focal motor/sensory deficits  LABORATORY DATA:  I have reviewed the data as listed    Latest Ref Rng & Units 07/18/2022    6:34 AM 07/17/2022    5:28 PM 07/17/2022    6:22 AM  CBC  Hemoglobin 12.0 - 15.0 g/dL 8.8  9.7  7.3   Hematocrit 36.0 - 46.0 % 24.9  28.8  20.6       Latest Ref Rng & Units 07/15/2022   12:20 AM 07/14/2022    1:09 PM 01/25/2022   10:44 PM  CMP  Glucose 70 - 99 mg/dL 121  134  114   BUN 6 - 20 mg/dL '7  8  16   '$ Creatinine 0.44 - 1.00 mg/dL 1.16  1.27  1.72   Sodium 135 - 145 mmol/L 133  135  136   Potassium 3.5 - 5.1 mmol/L 3.8  3.7  4.1   Chloride 98 - 111 mmol/L 107  106  109   CO2 22 - 32 mmol/L '21  24  21   '$ Calcium 8.9 - 10.3 mg/dL 8.1  8.5  9.1   Total Protein 6.5 - 8.1 g/dL 7.4  7.5  7.9   Total Bilirubin 0.3 - 1.2 mg/dL 1.8  2.0  3.6   Alkaline Phos 38 - 126 U/L 85  80  142   AST 15 - 41 U/L 54  58  1,000   ALT 0 - 44 U/L 21  25  313       RADIOGRAPHIC STUDIES: I have personally reviewed the radiological images as listed and agreed with the findings in the report. MR ABDOMEN W WO CONTRAST  Result Date: 07/16/2022 CLINICAL DATA:  Cirrhosis.  Hepatic mass on recent ultrasound. EXAM: MRI ABDOMEN WITHOUT AND WITH CONTRAST TECHNIQUE: Multiplanar multisequence MR imaging of the abdomen was performed both before and after the administration of intravenous contrast. CONTRAST:  65m GADAVIST GADOBUTROL 1 MMOL/ML IV SOLN COMPARISON:  Ultrasound on 07/15/2022 FINDINGS: Lower chest:  No acute findings. Hepatobiliary: Findings of hepatic cirrhosis are seen. Recanalization paraumbilical veins is consistent with portal venous hypertension. A mass is seen in segment 8  of the right lobe which measures 1.6 x 1.5 cm, and shows arterial phase hyperenhancement, contrast out washout, and delayed peripheral rim enhancement. Several other sub-centimeter foci of arterial phase hyperenhancement also seen in the right hepatic lobe, which are more difficult to characterize due to their small size. These lesions show no definite contrast washout or delayed peripheral rim enhancement. Prior cholecystectomy. No evidence of biliary obstruction. Pancreas:  No mass or inflammatory changes. Spleen:  Within normal limits in size and appearance. Adrenals/Urinary Tract: No suspicious masses identified. A tiny benign Bosniak category 2 hemorrhagic cyst is seen in the lateral midpole the left kidney. (no followup imaging is recommended). No evidence of hydronephrosis. Stomach/Bowel: Left-sided colonic diverticulosis, without evidence of diverticulitis in this region. Diffuse mesenteric and body wall edema, without ascites. Vascular/Lymphatic: No pathologically enlarged lymph nodes identified. No acute vascular findings. Left upper quadrant portosystemic collaterals, consistent with portal venous hypertension. Other:  None. Musculoskeletal:  No suspicious bone lesions identified. IMPRESSION: Hepatic cirrhosis and findings of portal venous hypertension. 1.6 cm mass in segment 8 of the right hepatic lobe, which is diagnostic for hepatocellular carcinoma. (LI-RADS Category 5: Definitely hepatocellular carcinoma). Several other sub-centimeter foci of arterial phase hyperenhancement in the right hepatic lobe, which show no contrast washout or peripheral capsular enhancement. (LI-RADS Category 3: Intermediate probability of malignancy). Recommend continued follow-up by abdomen MRI without and with contrast in 6 months. No  evidence of metastatic disease. Electronically Signed   By: Marlaine Hind M.D.   On: 07/16/2022 17:02   US Abdomen Complete  Result Date: 07/15/2022 CLINICAL DATA:  Cirrhosis EXAM: ABDOMEN ULTRASOUND COMPLETE COMPARISON:  CT 07/14/2022 FINDINGS: Gallbladder: Surgically absent. Common bile duct: Diameter: 2.5 mm, normal. No intrahepatic ductal dilation. Liver: There is a 1.7 x 1.5 x 1.9 cm hypoechoic mass in the right hepatic lobe. Questionable internal vascularity. Mildly nodular liver contours. Portal vein is patent on color Doppler imaging with normal direction of blood flow towards the liver. IVC: No abnormality visualized. Pancreas: Visualized portion unremarkable. Spleen: Size and appearance within normal limits. Right Kidney: Length: 10.0 cm. Echogenicity within normal limits. No mass or hydronephrosis visualized. Left Kidney: Length: 10.4 cm. Echogenicity is within normal limits. No hydronephrosis. There is a 1.4 cm cyst in the upper pole which is better evaluated on prior CTA of the abdomen and is likely a simple cyst. Prominent column of Bertin Abdominal aorta: No aneurysm visualized. Other findings: None. IMPRESSION: 1.9 cm hypoechoic mass in right hepatic lobe. Mildly nodular liver contours, which could suggest underlying chronic liver disease/possible cirrhosis. Recommend non-emergent MRI with and without contrast as an outpatient to assess the right hepatic lobe mass, when the patient is stable and able to follow breathing instructions. Electronically Signed   By: Maurine Simmering M.D.   On: 07/15/2022 16:44   CT ANGIO GI BLEED  Result Date: 07/14/2022 CLINICAL DATA:  Follow-up abdominal aortic aneurysm. Vaginal bleeding. EXAM: CTA ABDOMEN AND PELVIS WITHOUT AND WITH CONTRAST TECHNIQUE: Multidetector CT imaging of the abdomen and pelvis was performed using the standard protocol during bolus administration of intravenous contrast. Multiplanar reconstructed images and MIPs were obtained and reviewed to  evaluate the vascular anatomy. RADIATION DOSE REDUCTION: This exam was performed according to the departmental dose-optimization program which includes automated exposure control, adjustment of the mA and/or kV according to patient size and/or use of iterative reconstruction technique. CONTRAST:  160m OMNIPAQUE IOHEXOL 350 MG/ML SOLN COMPARISON:  01/26/2022 FINDINGS: VASCULAR Aorta: Unenhanced images demonstrate no significant vascular calcifications. Surgical clips  in the right upper quadrant consistent with cholecystectomy. Surgical clips in the pelvis consistent with tubal ligations. Normal caliber abdominal aorta. No aneurysm or dissection. No significant stenosis. Celiac: Patent without evidence of aneurysm, dissection, vasculitis or significant stenosis. SMA: Patent without evidence of aneurysm, dissection, vasculitis or significant stenosis. Renals: Both renal arteries are patent without evidence of aneurysm, dissection, vasculitis, fibromuscular dysplasia or significant stenosis. IMA: Patent without evidence of aneurysm, dissection, vasculitis or significant stenosis. Inflow: Patent without evidence of aneurysm, dissection, vasculitis or significant stenosis. Proximal Outflow: Bilateral common femoral and visualized portions of the superficial and profunda femoral arteries are patent without evidence of aneurysm, dissection, vasculitis or significant stenosis. Veins: No obvious venous abnormality within the limitations of this arterial phase study. Review of the MIP images confirms the above findings. NON-VASCULAR Lower chest: Trace left pleural effusion with basilar atelectasis. Small esophageal hiatal hernia. Hepatobiliary: No focal liver abnormality is seen. Status post cholecystectomy. No biliary dilatation. Pancreas: Unremarkable. No pancreatic ductal dilatation or surrounding inflammatory changes. Spleen: Normal in size without focal abnormality. Adrenals/Urinary Tract: Adrenal glands are  unremarkable. Kidneys are normal, without renal calculi, focal lesion, or hydronephrosis. Bladder is unremarkable. Stomach/Bowel: Stomach, small bowel, and colon are not abnormally distended. There is evidence of some wall thickening in the colon with areas including the cecum, sigmoid colon, and rectum involved. This may represent inflammatory process such as Crohn disease or other colitis. Diverticulosis of the sigmoid colon without evidence of acute diverticulitis. Appendix is normal. No evidence of any focal area of intraluminal contrast opacification, suggesting no identified areas of active extravasation/gastrointestinal bleeding. Lymphatic: No significant lymphadenopathy. Reproductive: Surgical absence of the uterus. No abnormal adnexal masses. Other: No free air or free fluid in the abdomen. Abdominal wall musculature appears intact. Musculoskeletal: Degenerative changes in the spine. IMPRESSION: VASCULAR Normal abdominal aorta. No evidence of aneurysm or dissection. No large vessel occlusion or acute abnormality. NON-VASCULAR 1. No evidence of active contrast extravasation to suggest a focal site of gastrointestinal bleeding. 2. Colonic wall thickening involving the rectosigmoid and cecal regions suggesting colitis, possibly Crohn disease. 3. Trace left pleural effusion with basilar atelectasis. Electronically Signed   By: Lucienne Capers M.D.   On: 07/14/2022 21:38   US PELVIC COMPLETE WITH TRANSVAGINAL  Result Date: 07/14/2022 CLINICAL DATA:  [id number] Vaginal bleeding [id number] EXAM: ULTRASOUND OF PELVIS TECHNIQUE: Transabdominal and transvaginalultrasound examination of the pelvis was performed including evaluation of the uterus, ovaries, adnexal regions, and pelvic cul-de-sac. COMPARISON:  None Available. FINDINGS: Patient is status post hysterectomy. Ovaries are not identified. No masses or fluid collections are seen. IMPRESSION: Status post hysterectomy. No adnexal pathology. Ovaries not identified.  Electronically Signed   By: Sammie Bench M.D.   On: 07/14/2022 17:02    ASSESSMENT & PLAN: 57 yo female   Hepatocellular carcinoma  -Pt Is a 57 year-old female with untreated hepatitis B, liver cirrhosis, and recent GI bleeding.  -Child-Pugh score is 8 (class B) -Found to have 1.6 cm liver mass in segment 8 right liver with typical image findings consistent with HCC, and elevated AFP at 12, so diagnosis of HCC is quite certain. We are not recommending needle biopsy  to confirm the diagnosis.  -She is being referred for CT chest to complete staging  -We did discuss the treatment options of liver transplant versus surgery versus liver targeted therapy  -She is not very interested in transplant, does not drive herself. Open to surgical consult but seems most interested in liver directed therapy such  as ablation.  -I referred her to surgery for discussion -We will discuss her case in tumor board and f/up accordingly.   Anemia -She has had mild microcytic anemia since at least 10/2015 with hgb 10.1, B12, folate, and iron studies normal in 02/2016 and 07/2016  -Baseline 9-10 range since then, and hgb normal 08/2019 -She presented to ED with rectal bleeding and acute on chronic anemia, hgb down to 6.6 on 07/16/22. S/p 2 units RBCs -Also takes oral iron periodically, although ferritin has been elevated  -EGD/Colonoscopy 07/18/22 showed no active bleeding or varices; bleeding sources was possibly from hemorrhoids and appears to have resolved   HBV -Followed by liver clinic NP Dawn Drazek -Currently on Entecavir, starting 06/2022 -Encouraged her children to be tested   PLAN: -CT chest in 1-2 weeks to complete staging, will call with results -Research RN to consent pt to specimen study today -Review case in tumor board -Referral to Dr. Barry Dienes or Dr. Zenia Resides for surgical consult -Pt previously referred to hepatologist Dr. Loletha Grayer at [facility], appt 5/1 -F/up in 3 months, or sooner if needed -Continue f/up  wth Roosevelt Locks, NP for liver care    Orders Placed This Encounter  Procedures   CT Chest W Contrast    Standing Status:   Future    Standing Expiration Date:   07/27/2023    Order Specific Question:   If indicated for the ordered procedure, I authorize the administration of contrast media per Radiology protocol    Answer:   Yes    Order Specific Question:   Does the patient have a contrast media/X-ray dye allergy?    Answer:   No    Order Specific Question:   Is patient pregnant?    Answer:   No    Order Specific Question:   Preferred imaging location?    Answer:   [facility]   CBC with Differential (Cancer Center Only)    Standing Status:   Standing    Number of Occurrences:   15    Standing Expiration Date:   07/27/2023   CMP ([place] only)    Standing Status:   Standing    Number of Occurrences:   15    Standing Expiration Date:   07/27/2023   Ferritin    Standing Status:   Standing    Number of Occurrences:   15    Standing Expiration Date:   07/27/2023   AFP tumor marker    Standing Status:   Standing    Number of Occurrences:   15    Standing Expiration Date:   07/27/2023   Ambulatory referral to General Surgery    Referral Priority:   Routine    Referral Type:   Surgical    Referral Reason:   Specialty Services Required    Requested Specialty:   General Surgery    Number of Visits Requested:   1    All questions were answered. The patient knows to call the clinic with any problems, questions or concerns.     Alla Feeling, NP 07/27/2022   Addendum I have seen the patient, examined her. I agree with the assessment and and plan and have edited the notes.   57 year old female, with newly diagnosed hepatitis B and liver cirrhosis in late 2023, on treatment, managed by liver clinic NP Dawn Drazek.  She was recently hospitalized for rectal bleeding, and was incidentally found to have a 1.6 cm mass in segment 8 of the  right lobe of liver.  The imaging  characteristic was consistent with [place].  Her AFP was also slightly elevated.  No biopsy is needed.  No lymph node or distant metastasis noticed on scan.  Will obtain CT chest to complete staging.  I will refer him to our liver surgeon Dr. Barry Dienes or Dr. Zenia Resides, to discuss surgical resection.  She was also referred to [facility] hepatic clinic by her GI.  We discussed the other options of cancer treatment, such as liver transplant, microwave ablation by IR, or stereotactic radiation.  I encouraged her to consider surgical resection.  She agrees to be referred to surgeon.  All questions were answered.  I will see her back in 3 months for follow up.  Truitt Merle MD  07/27/2022

## 2022-07-27 NOTE — Progress Notes (Unsigned)
I met with Ms Bowdish during her consultation with [name], NP and  Dr Burr Medico.  I explained my role as a nurse navigator and provided my contact information.

## 2022-07-27 NOTE — Research (Signed)
[employer] 2021-05 - Specimen Collection Study to Evaluate Biomarkers in Subjects with Cancer     This Nurse has reviewed this patient's inclusion and exclusion criteria as a second review and confirms Marissa Warner is eligible for study participation.  Patient may continue with enrollment.   Brion Aliment RN, BSN, CCRP Clinical Research Nurse Lead 07/27/2022 2:13 PM

## 2022-07-27 NOTE — Research (Signed)
[employer] 2021-05 - Specimen Collection Study to Evaluate Biomarkers in Subjects with Cancer    07/27/2022  This Coordinator has reviewed this patient's inclusion and exclusion criteria and confirmed Marissa Warner is eligible for study participation.  Patient will continue with enrollment.  Menopausal status (women only): Marissa Warner is has had a hysterectomy with unknown LMP.  Eligibility confirmed by treating investigator, who also agrees that patient should proceed with enrollment.   CONSENT:  Patient Marissa Warner was identified by [name], NP as a potential candidate for the above listed study.  This Clinical Research Coordinator met with Marissa Warner, [id number], on 07/27/22 in a manner and location that ensures patient privacy to discuss participation in the above listed research study.  Patient is Unaccompanied.  A copy of the informed consent document with embedded HIPAA language was provided to the patient.  Patient reads, speaks, and understands [place].    Patient was provided with the business card of this Coordinator and encouraged to contact the research team with any questions.  Patient was provided the option of taking informed consent documents home to review and was encouraged to review at their convenience with their support network, including other care providers. Patient is comfortable with making a decision regarding study participation today.  As outlined in the informed consent form, this Coordinator and [name] discussed the purpose of the research study, the investigational nature of the study, study procedures and requirements for study participation, potential risks and benefits of study participation, as well as alternatives to participation. This study is not blinded. The patient understands participation is voluntary and they may withdraw from study participation at any time.  This study does not involve randomization.  This study does not involve an  investigational drug or device. This study does not involve a placebo. Patient understands enrollment is pending full eligibility review.   Confidentiality and how the patient's information will be used as part of study participation were discussed.  Patient was informed there is reimbursement provided for their time and effort spent on trial participation.  The patient is encouraged to discuss research study participation with their insurance provider to determine what costs they may incur as part of study participation, including research related injury.    All questions were answered to patient's satisfaction.  The informed consent with embedded HIPAA language was reviewed page by page.  The patient's mental and emotional status is appropriate to provide informed consent, and the patient verbalizes an understanding of study participation.  Patient has agreed to participate in the above listed research study and has voluntarily signed the informed consent 05 Jun 2020 version with embedded HIPAA language, version 05 Jun 2020  on 07/27/22 at [place] PM.  The patient was provided with a copy of the signed informed consent form with embedded HIPAA language for their reference.  No study specific procedures were obtained prior to the signing of the informed consent document.  Approximately 30 minutes were spent with the patient reviewing the informed consent documents.  Patient was not requested to complete a Release of Information form.   After consent was complete, medical history was obtained as follows:  Medical History:  High Blood Pressure  Yes Coronary Artery Disease No Lupus    No Rheumatoid Arthritis  No Diabetes   No      Lynch Syndrome  No  Is the patient currently taking a magnesium supplement?   No  Does the patient have a personal history of cancer (greater than  5 years ago)?  No  Does the patient have a family history of cancer in 1st or 2nd degree relatives? No  Does the patient have  history of alcohol consumption? Yes   If yes, current or former? former If former, year stopped? 1994 Number of years? 11 yrs (patient stated she started at 57 yrs old) Drinks per week? 2  Does the patient have history of cigarette, cigar, pipe, or chewing tobacco use?  Yes  If yes, current for former? current If yes, type (Cigarette, cigar, pipe, and/or chewing tobacco)? Chewing tobacco   Number of years? 72 yrs (patient stated she started at 57 yrs old) Packs/number/containers per day? 1 pack per day   After medical history was obtained, patient was escorted the lab area.  Blood Collection: Research blood obtained by Fresh venipuncture. Patient tolerated well without any adverse events. Gift Card: $50 gift card given to patient for her participation in this study.    Patient was thanked for her time and consideration of the above mentioned study. Patient was encouraged to call this coordinator with any questions or concerns.   Carol Ada, RT(R)(T) Clinical Research Coordinator

## 2022-07-28 NOTE — Progress Notes (Signed)
Referral, demographics, insurance information, and consult note faxed to [facility].

## 2022-07-28 NOTE — Progress Notes (Signed)
Consult note faxed to Dr Therisa Doyne.

## 2022-07-29 LAB — HBV GENOTYPE (REFLEXED)

## 2022-07-29 LAB — HBV QUANT PCR RFX TO GENOTYPE
HBV IU/mL: 68600 IU/mL
log10 HBV as IU/mL: 4.836 log10 IU/mL

## 2022-07-29 LAB — AFP TUMOR MARKER: AFP, Serum, Tumor Marker: 13.1 ng/mL — ABNORMAL HIGH (ref 0.0–9.2)

## 2022-08-01 IMAGING — CT CT ANGIO CHEST
2 of 7 series · 18 of 46 positions shown · IV contrast (APPLIED)
Comparison: Chest radiograph October 08, 2019

CLINICAL DATA: Pain and shortness of breath concern for pulmonary
embolus

EXAM:
CT ANGIOGRAPHY CHEST WITH CONTRAST
TECHNIQUE: Multidetector CT imaging of the chest was performed using the
standard protocol during bolus administration of intravenous
contrast. Multiplanar CT image reconstructions and MIPs were
obtained to evaluate the vascular anatomy.
CONTRAST:  50mL OMNIPAQUE IOHEXOL 350 MG/ML SOLN

[Series 6: thins · axial · 0.77mm/px · z∈[-238,-24]mm · 15 of 345 slices shown]
[im 20/345  lung]
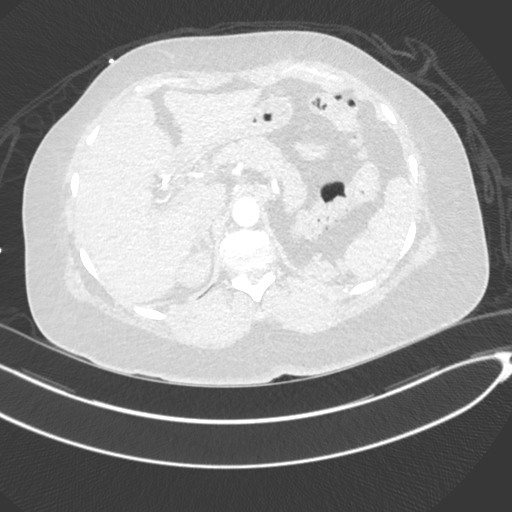
[im 39/345  soft-tissue]
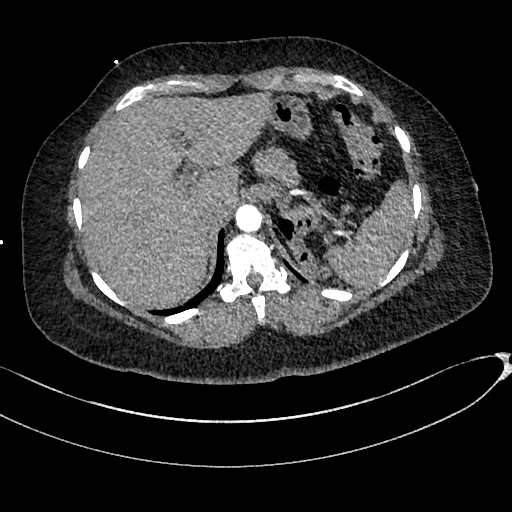
[im 58/345  lung]
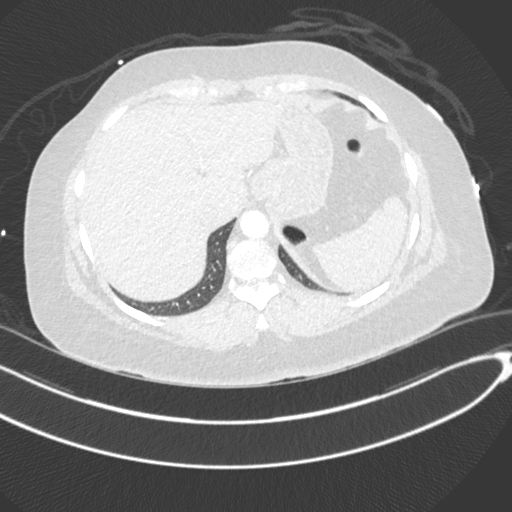
[im 77/345  soft-tissue]
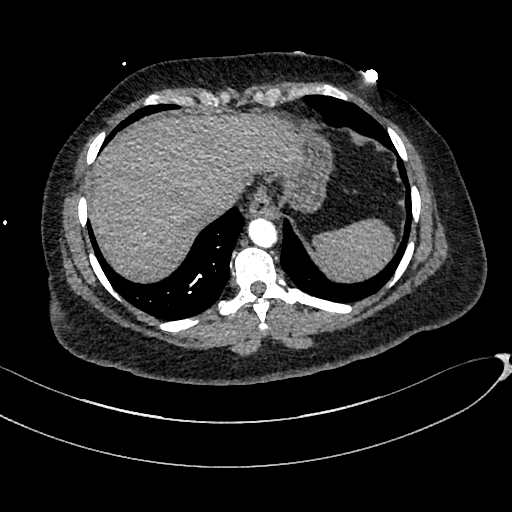
[im 115/345  lung]
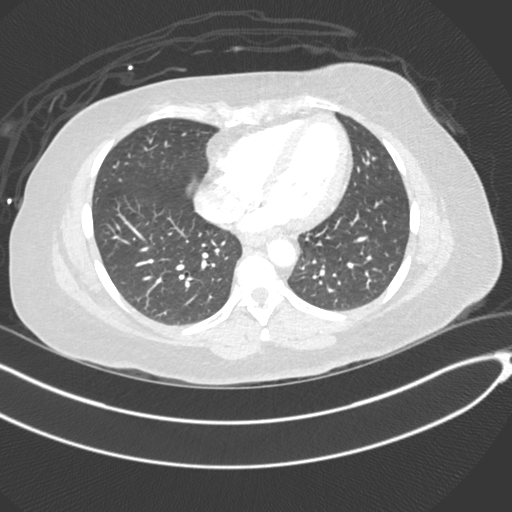
[im 134/345  soft-tissue]
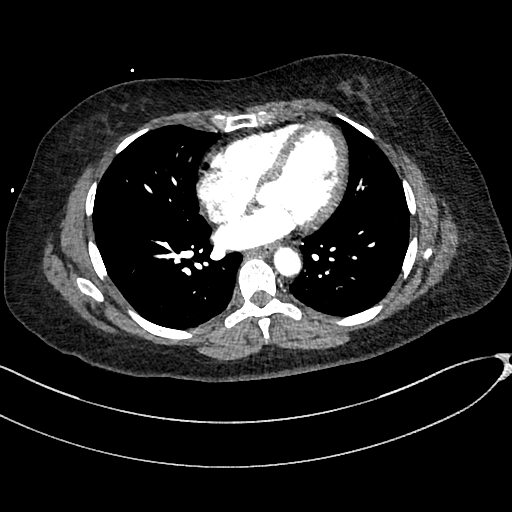
[im 153/345  lung]
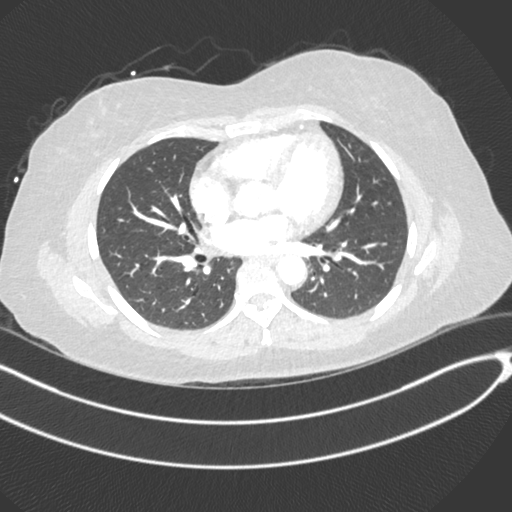
[im 173/345  soft-tissue]
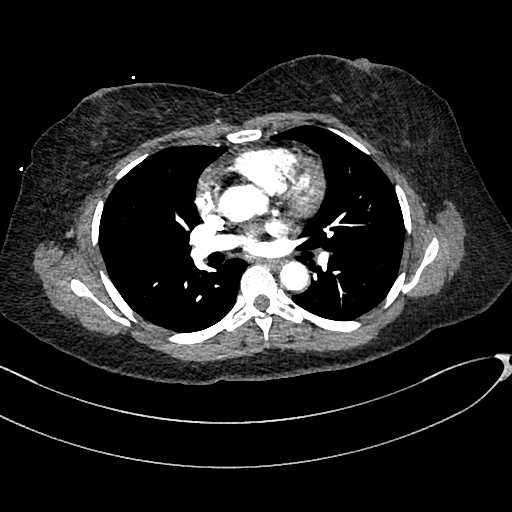
[im 192/345  lung]
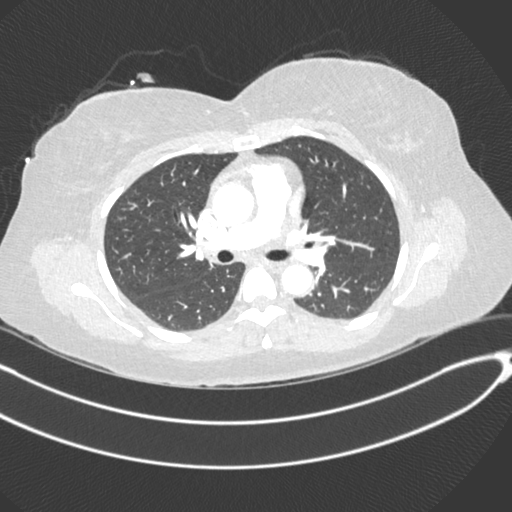
[im 211/345  soft-tissue]
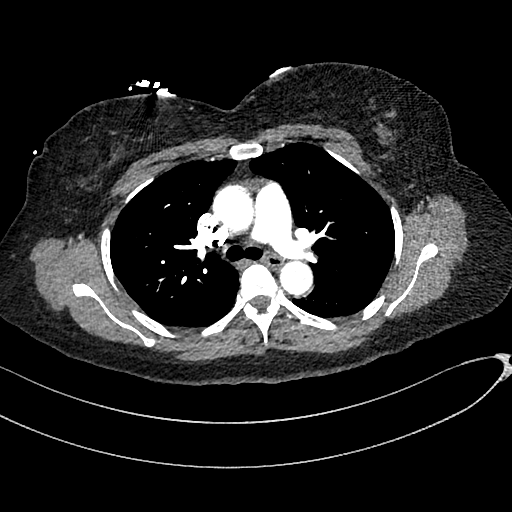
[im 230/345  lung]
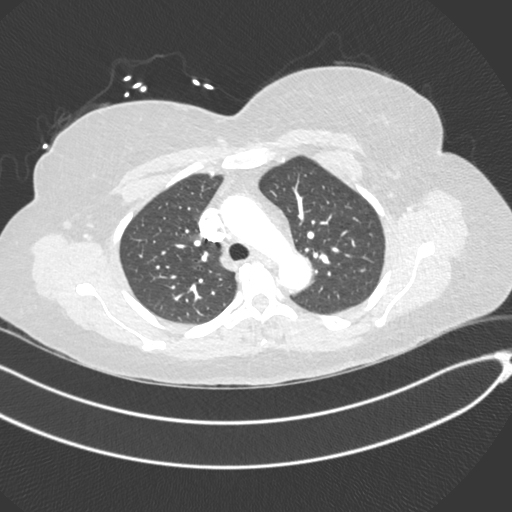
[im 268/345  soft-tissue]
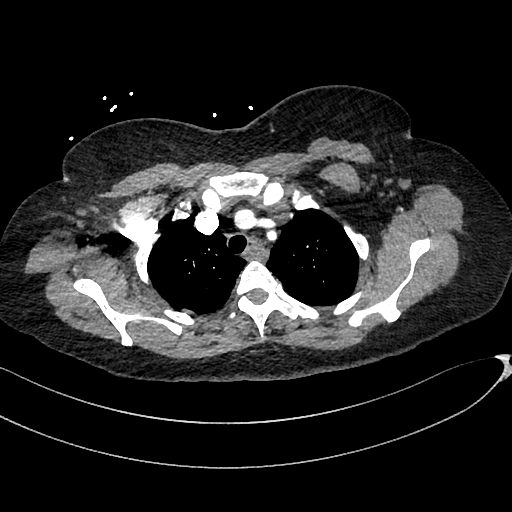
[im 287/345  lung]
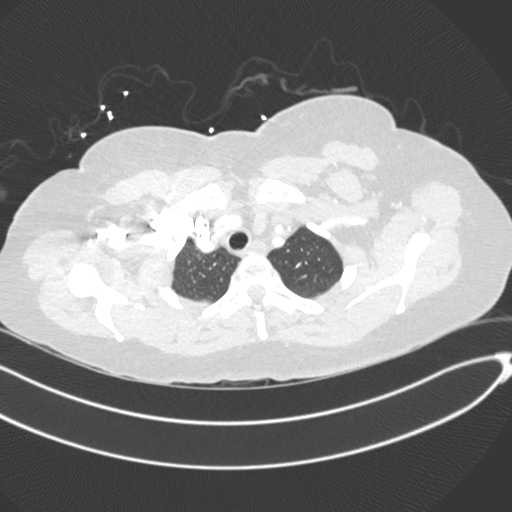
[im 306/345  soft-tissue]
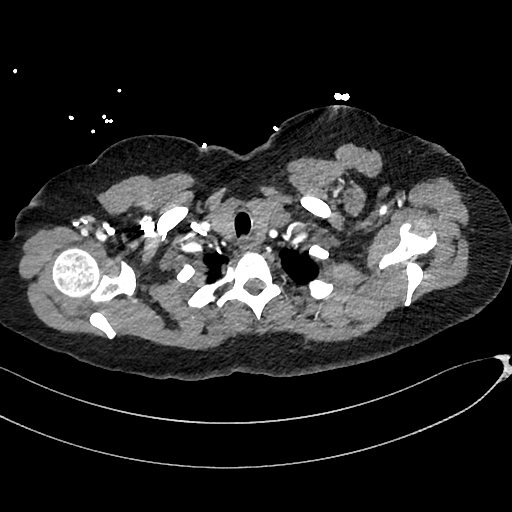
[im 325/345  lung]
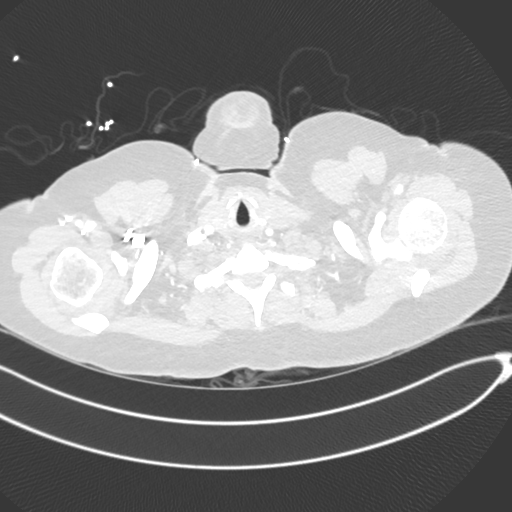

[Series 8: cor · coronal · 0.48mm/px · 3 of 136 slices shown]
[im 34/136  soft-tissue]
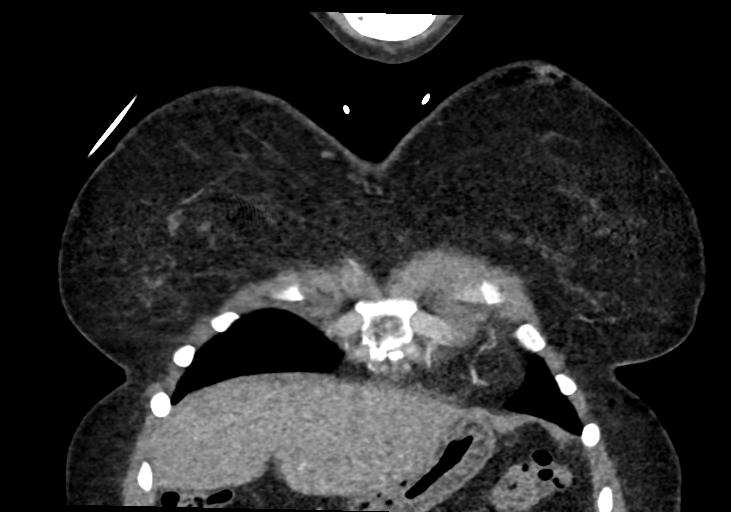
[im 68/136  soft-tissue]
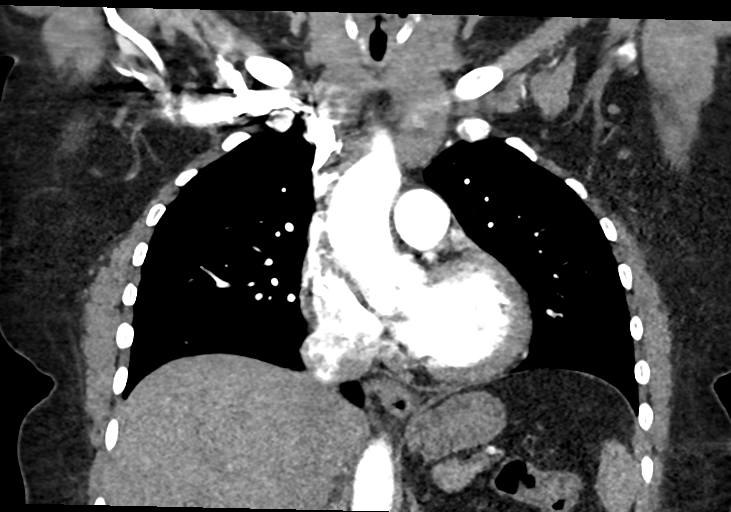
[im 102/136  soft-tissue]
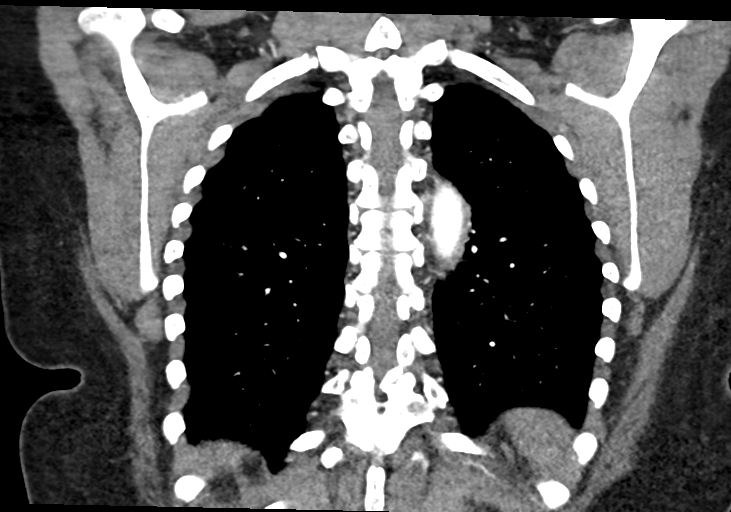

[18 of 46 positions shown; findings below may reference images not displayed]

FINDINGS: Cardiovascular: Satisfactory opacification of the pulmonary arteries
to the segmental level. No evidence of pulmonary embolism. Normal
heart size. No pericardial effusion.

Mediastinum/Nodes: No enlarged mediastinal, hilar, or axillary lymph
nodes. Thyroid gland, trachea, and esophagus demonstrate no
significant findings.

Lungs/Pleura: Lungs are clear. No pleural effusion or pneumothorax.

Upper Abdomen: No acute abnormality.

Musculoskeletal: No chest wall abnormality. No acute or significant
osseous findings.

Review of the MIP images confirms the above findings.
IMPRESSION: Negative examination for pulmonary embolism or other acute
intrathoracic process.

## 2022-08-03 ENCOUNTER — Other Ambulatory Visit: Payer: Self-pay

## 2022-08-03 NOTE — Progress Notes (Signed)
The proposed treatment discussed in conference is for discussion purpose only and is not a binding recommendation.  The patients have not been physically examined, or presented with their treatment options.  Therefore, final treatment plans cannot be decided.  

## 2022-08-04 NOTE — Progress Notes (Signed)
I spoke with Marissa Warner and relayed the recommendations from GI Conference on 08/03/2022.  She states she will speak with Roosevelt Locks, NP regarding liver transplant.  I told her I will make sure that appt gets made. All questions were answered.  She verbalized understanding.

## 2022-08-11 LAB — HEMOCHROMATOSIS DNA-PCR(C282Y,H63D)

## 2022-08-12 ENCOUNTER — Other Ambulatory Visit: Payer: Self-pay | Admitting: Nurse Practitioner

## 2022-08-12 ENCOUNTER — Other Ambulatory Visit (HOSPITAL_COMMUNITY): Payer: Self-pay | Admitting: Nurse Practitioner

## 2022-08-12 DIAGNOSIS — R748 Abnormal levels of other serum enzymes: Secondary | ICD-10-CM

## 2022-08-12 DIAGNOSIS — K7469 Other cirrhosis of liver: Secondary | ICD-10-CM

## 2022-08-12 DIAGNOSIS — C220 Liver cell carcinoma: Secondary | ICD-10-CM

## 2022-08-15 ENCOUNTER — Encounter: Payer: Self-pay | Admitting: General Practice

## 2022-08-15 NOTE — Progress Notes (Signed)
Suzette Battiest, MD  Allen Kell, NT PROCEDURE / BIOPSY REVIEW Date: 08/12/22  Requested Biopsy site: NONFOCAL liver biopsy (not liver mass) Reason for request: establish baseline cirrhosis Imaging review: None relevant.  Decision: Approved Imaging modality to perform: Ultrasound Schedule with: Moderate Sedation Schedule for: Any VIR  Additional comments: This is a non-focal liver bx.  Please contact me with questions, concerns, or if issue pertaining to this request arise.  Suzette Battiest, MD [facility]       Previous Messages    ----- Message ----- From: Allen Kell, NT Sent: 08/12/2022  10:16 AM EDT To: Ir Procedure Requests Subject: US Biopsy Liver                                Procedure: US Biopsy Liver  Reason: elevated liver enzymes Dx: Elevated liver enzymes [R74.8 (ICD-10-CM)]; Other cirrhosis of liver ([place])  History: MR / [place] in chart  Provider: Roosevelt Locks, [place]: [phone]

## 2022-08-16 ENCOUNTER — Encounter (HOSPITAL_COMMUNITY): Payer: Self-pay

## 2022-08-16 ENCOUNTER — Ambulatory Visit (HOSPITAL_COMMUNITY)
Admission: RE | Admit: 2022-08-16 | Discharge: 2022-08-16 | Disposition: A | Discharge status: Home / Self Care | Payer: Medicaid Other | Source: Ambulatory Visit | Attending: Nurse Practitioner | Admitting: Nurse Practitioner

## 2022-08-16 DIAGNOSIS — C220 Liver cell carcinoma: Secondary | ICD-10-CM | POA: Diagnosis not present

## 2022-08-16 MED ORDER — IOHEXOL 300 MG/ML  SOLN
75.0000 mL | Freq: Once | INTRAMUSCULAR | Status: AC | PRN
  Administered 2022-08-16: 75 mL via INTRAVENOUS

## 2022-08-16 MED ORDER — SODIUM CHLORIDE (PF) 0.9 % IJ SOLN
INTRAMUSCULAR | Status: AC
  Filled 2022-08-16: qty 50

## 2022-08-31 ENCOUNTER — Other Ambulatory Visit: Payer: Self-pay | Admitting: Radiology

## 2022-08-31 DIAGNOSIS — R748 Abnormal levels of other serum enzymes: Secondary | ICD-10-CM

## 2022-09-02 ENCOUNTER — Other Ambulatory Visit: Payer: Self-pay | Admitting: Student

## 2022-09-04 ENCOUNTER — Encounter (HOSPITAL_COMMUNITY): Payer: Self-pay

## 2022-09-04 NOTE — H&P (Signed)
Chief Complaint: Elevated liver ezymes, positive ASMA,  elevated IgM. Known mass in segment Request is uninvolved liver biopsy for histological diagnosis.  Referring Physician(s): Drazek,Dawn  Supervising Physician: Richarda Overlie  Patient Status: [facility] - Out-pt  History of Present Illness: Marissa Warner is a 57 y.o. female outpatient. History of HTN, Hep B, renal disorder. Found to have elevated liver enzymes, positive ASMA, elevated IgM and mass in segment 8 of the right hepatic lobe. Request is for univolved liver biopsy for further evaluation of histological diagnosis.  Currently without any significant complaints. Patient alert and laying in bed,calm. Denies any fevers, headache, chest pain, SOB, cough, abdominal pain, nausea, vomiting or bleeding. Return precautions and treatment recommendations and follow-up discussed with the patient *** who is agreeable with the plan.        Past Medical History:  Diagnosis Date   Allergy    environmental and seasonal   Blood transfusion without reported diagnosis before 2004   due to heavy periods   Hepatitis B    Hypertension    Migraines 2007   Renal disorder    Pt. states "Stage III Kidney disease"  Never had kidney biopsy.  She is unable to tell me what the cause of the kidney disease.  Reportedly diagnosed with [facility] in [place].    Past Surgical History:  Procedure Laterality Date   ABDOMINAL HYSTERECTOMY  2004   With BSO, she thinks   BIOPSY  09/09/2019   Procedure: BIOPSY;  Surgeon: Kerin Salen, MD;  Location: [facility] ENDOSCOPY;  Service: Gastroenterology;;   CHOLECYSTECTOMY  uncertain date   Laparoscopic   COLONOSCOPY N/A 07/18/2022   Procedure: COLONOSCOPY;  Surgeon: Kerin Salen, MD;  Location: [facility] ENDOSCOPY;  Service: Gastroenterology;  Laterality: N/A;   COLONOSCOPY WITH PROPOFOL N/A 09/09/2019   Procedure: COLONOSCOPY WITH PROPOFOL;  Surgeon: Kerin Salen, MD;  Location: [facility] ENDOSCOPY;  Service:  Gastroenterology;  Laterality: N/A;   ESOPHAGOGASTRODUODENOSCOPY (EGD) WITH PROPOFOL N/A 09/09/2019   Procedure: ESOPHAGOGASTRODUODENOSCOPY (EGD) WITH PROPOFOL;  Surgeon: Kerin Salen, MD;  Location: [facility] ENDOSCOPY;  Service: Gastroenterology;  Laterality: N/A;   ESOPHAGOGASTRODUODENOSCOPY (EGD) WITH PROPOFOL N/A 07/18/2022   Procedure: ESOPHAGOGASTRODUODENOSCOPY (EGD) WITH PROPOFOL;  Surgeon: Kerin Salen, MD;  Location: [facility] ENDOSCOPY;  Service: Gastroenterology;  Laterality: N/A;   LAPAROSCOPIC INCISIONAL / UMBILICAL / VENTRAL HERNIA REPAIR  2000   POLYPECTOMY  07/18/2022   Procedure: POLYPECTOMY;  Surgeon: Kerin Salen, MD;  Location: [facility] ENDOSCOPY;  Service: Gastroenterology;;    Allergies: Patient has no known allergies.  Medications: Prior to Admission medications   Medication Sig Start Date End Date Taking? Authorizing Provider  acetaminophen (TYLENOL) 500 MG tablet Take 500 mg by mouth every 6 (six) hours as needed for mild pain.    [provider]  carvedilol (COREG) 6.25 MG tablet Take 1 tablet (6.25 mg total) by mouth 2 (two) times daily. 07/02/21   [name], MD  diclofenac Sodium (VOLTAREN) 1 % GEL Apply 4 g topically 4 (four) times daily. Patient taking differently: Apply 4 g topically 4 (four) times daily as needed (Pain). 06/03/19   Julieanne Manson, MD  fluticasone (FLONASE) 50 MCG/ACT nasal spray Place 1 spray into both nostrils daily.    [provider]  losartan (COZAAR) 25 MG tablet Take 25 mg by mouth daily. 07/11/22   [provider]  polyethylene glycol powder (GLYCOLAX/MIRALAX) 17 GM/SCOOP powder Take by mouth.    [provider]  Vitamin D, Ergocalciferol, (DRISDOL) 1.25 MG (50000 UNIT) CAPS  capsule Take 50,000 Units by mouth every 7 (seven) days.    [provider]     No family history on file.  Social History   Socioeconomic History   Marital status: Single    Spouse name: Not on file   Number of children:  4   Years of education: 12   Highest education level: Not on file  Occupational History   Occupation: unemployed    Comment: Previously in housekeeping  Tobacco Use   Smoking status: Never   Smokeless tobacco: Current    Types: Chew   Tobacco comments:    down to once daily or every other day  Substance and Sexual Activity   Alcohol use: Not Currently   Drug use: No   Sexual activity: Never    Partners: Male  Other Topics Concern   Not on file  Social History Narrative   Originally from [place], not far from [place], [place]. [place])   Moved to [place] in March 2017 to be near daughter.   Lives alone near [place].   Social Determinants of Health   Financial Resource Strain: Not on file  Food Insecurity: No Food Insecurity (07/15/2022)   Hunger Vital Sign    Worried About Running Out of Food in the Last Year: Never true    Ran Out of Food in the Last Year: Never true  Transportation Needs: No Transportation Needs (07/15/2022)   PRAPARE - Administrator, Civil Service (Medical): No    Lack of Transportation (Non-Medical): No  Physical Activity: Not on file  Stress: Not on file  Social Connections: Not on file    ECOG Status: {CHL ONC ECOG NM:[id number]}  Review of Systems: A 12 point ROS discussed and pertinent positives are indicated in the HPI above.  All other systems are negative.  Review of Systems  Vital Signs: There were no vitals taken for this visit.    Physical Exam  Imaging: CT Chest W Contrast  Result Date: 08/17/2022 CLINICAL DATA:  Hepatocellular carcinoma. Staging. * Tracking Code: BO * EXAM: CT CHEST WITH CONTRAST TECHNIQUE: Multidetector CT imaging of the chest was performed during intravenous contrast administration. RADIATION DOSE REDUCTION: This exam was performed according to the departmental dose-optimization program which includes automated exposure control, adjustment of the mA and/or kV according to patient size and/or  use of iterative reconstruction technique. CONTRAST:  55mL OMNIPAQUE IOHEXOL 300 MG/ML  SOLN COMPARISON:  Chest CTA 10/04/2020 FINDINGS: Cardiovascular: The heart size is normal. No substantial pericardial effusion. No thoracic aortic aneurysm. Mediastinum/Nodes: No mediastinal lymphadenopathy. There is no hilar lymphadenopathy. The esophagus has normal imaging features. There is no axillary lymphadenopathy. Lungs/Pleura: No suspicious pulmonary nodule or mass. No focal airspace consolidation. No pleural effusion. Upper Abdomen: 11 mm lesion lateral upper interpolar left kidney was characterized as Bosniak II cyst on MRI of 07/16/2022. No followup imaging is recommended. Musculoskeletal: No worrisome lytic or sclerotic osseous abnormality. IMPRESSION: 1. No evidence for metastatic disease in the chest. Electronically Signed   By: Kennith Center M.D.   On: 08/17/2022 11:01    Labs:  CBC: Recent Labs    07/14/22 1310 07/15/22 0020 07/15/22 1650 07/16/22 0627 07/16/22 1259 07/17/22 0622 07/17/22 1728 07/18/22 0634 07/27/22 1411  WBC 3.7* 4.1  --  3.1*  --   --   --   --  4.7  HGB 7.5* 7.6*   < > 6.6*   < > 7.3* 9.7* 8.8* 9.4*  HCT 22.7* 22.4*   < >  18.8*   < > 20.6* 28.8* 24.9* 26.8*  PLT 90* 81*  --  75*  --   --   --   --  85*   < > = values in this interval not displayed.    COAGS: Recent Labs    01/26/22 0153 07/14/22 1714 07/15/22 0020 07/16/22 0627  INR 1.5* 1.6* 1.7* 1.7*    BMP: Recent Labs    01/25/22 2244 07/14/22 1309 07/15/22 0020 07/27/22 1411  NA 136 135 133* 138  K 4.1 3.7 3.8 3.4*  CL 109 106 107 109  CO2 21* 24 21* 26  GLUCOSE 114* 134* 121* 87  BUN CALCIUM 9.1 8.5* 8.1* 8.2*  CREATININE 1.72* 1.27* 1.16* 0.89  GFRNONAA 34* 49* 55* >60    LIVER FUNCTION TESTS: Recent Labs    01/25/22 2244 07/14/22 1309 07/15/22 0020 07/27/22 1411  BILITOT 3.6* 2.0* 1.8* 1.9*  AST 1,000* 58* 54* 37  ALT 313* ALKPHOS 142* 80 85 81  PROT 7.9  7.5 7.4 7.9  ALBUMIN 2.6* 2.0* 1.9* 2.4*    TUMOR MARKERS: No results for input(s): "AFPTM", "CEA", "CA199", "CHROMGRNA" in the last 8760 hours.  Assessment and Plan:  57 y.o. female outpatient. History of HTN, Hep B, renal disorder. Found to have elevated liver enzymes, positive ASMA, elevated IgM,  and mass in segment 8 of the right hepatic lobe. Request is for univolved liver biopsy for further evaluation of histological .diagnosis   MR abd w/wo contrast from 2.24.24 reads Hepatic cirrhosis and findings of portal venous hypertension. 1.6 cm mass in segment 8 of the right hepatic lobe, which is diagnostic for hepatocellular carcinoma. (LI-RADS Category 5: Definitely hepatocellular carcinoma). [place] abd from 2.23.24 reads 1.9 cm hypoechoic mass in right hepatic lobe. Mildly nodular liver contours, which could suggest underlying chronic liver disease/possible cirrhosis. *** All labs and medications are within acceptable parameters. NKDA. Patient has been NPO since midnight   Risks and benefits of liver biopsy was discussed with the patient and/or patient's family including, but not limited to bleeding, infection, damage to adjacent structures or low yield requiring additional tests.  All of the questions were answered and there is agreement to proceed.  Consent signed and in chart.   Thank you for this interesting consult.  I greatly enjoyed meeting Velna Hedgecock and look forward to participating in their care.  A copy of this report was sent to the requesting provider on this date.  Electronically Signed: Alene Mires, NP 09/04/2022, 8:20 PM   I spent a total of {New XWRU:[id number]} {New Out-Pt:[id number]}  {Established Out-Pt:[id number]} in face to face in clinical consultation, greater than 50% of which was counseling/coordinating care for ***

## 2022-09-05 ENCOUNTER — Ambulatory Visit (HOSPITAL_COMMUNITY)
Admission: RE | Admit: 2022-09-05 | Discharge: 2022-09-05 | Disposition: A | Discharge status: Home / Self Care | Payer: Medicaid Other | Source: Ambulatory Visit | Attending: Nurse Practitioner | Admitting: Nurse Practitioner

## 2022-09-05 DIAGNOSIS — R748 Abnormal levels of other serum enzymes: Secondary | ICD-10-CM | POA: Insufficient documentation

## 2022-09-05 DIAGNOSIS — N289 Disorder of kidney and ureter, unspecified: Secondary | ICD-10-CM | POA: Diagnosis not present

## 2022-09-05 DIAGNOSIS — K7469 Other cirrhosis of liver: Secondary | ICD-10-CM | POA: Insufficient documentation

## 2022-09-05 DIAGNOSIS — I10 Essential (primary) hypertension: Secondary | ICD-10-CM | POA: Diagnosis not present

## 2022-09-05 LAB — CBC
HCT: 26.4 % — ABNORMAL LOW (ref 36.0–46.0)
Hemoglobin: 8.8 g/dL — ABNORMAL LOW (ref 12.0–15.0)
MCH: 26.4 pg (ref 26.0–34.0)
MCHC: 33.3 g/dL (ref 30.0–36.0)
MCV: 79.3 fL — ABNORMAL LOW (ref 80.0–100.0)
Platelets: 95 10*3/uL — ABNORMAL LOW (ref 150–400)
RBC: 3.33 MIL/uL — ABNORMAL LOW (ref 3.87–5.11)
RDW: 16.7 % — ABNORMAL HIGH (ref 11.5–15.5)
WBC: 4.2 10*3/uL (ref 4.0–10.5)
nRBC: 0 % (ref 0.0–0.2)

## 2022-09-05 LAB — PROTIME-INR
INR: 1.5 — ABNORMAL HIGH (ref 0.8–1.2)
Prothrombin Time: 18.3 seconds — ABNORMAL HIGH (ref 11.4–15.2)

## 2022-09-05 MED ORDER — FENTANYL CITRATE (PF) 100 MCG/2ML IJ SOLN
INTRAMUSCULAR | Status: AC | PRN
  Administered 2022-09-05 (×3): 25 ug via INTRAVENOUS

## 2022-09-05 MED ORDER — MIDAZOLAM HCL 2 MG/2ML IJ SOLN
INTRAMUSCULAR | Status: AC
  Filled 2022-09-05: qty 2

## 2022-09-05 MED ORDER — SODIUM CHLORIDE 0.9 % IV SOLN: INTRAVENOUS | Status: DC

## 2022-09-05 MED ORDER — LIDOCAINE HCL (PF) 1 % IJ SOLN
10.0000 mL | Freq: Once | INTRAMUSCULAR | Status: AC
  Administered 2022-09-05: 10 mL via INTRADERMAL

## 2022-09-05 MED ORDER — GELATIN ABSORBABLE 12-7 MM EX MISC
1.0000 | Freq: Once | CUTANEOUS | Status: AC
  Administered 2022-09-05: 1 via TOPICAL

## 2022-09-05 MED ORDER — MIDAZOLAM HCL 2 MG/2ML IJ SOLN
INTRAMUSCULAR | Status: AC | PRN
  Administered 2022-09-05: 1 mg via INTRAVENOUS

## 2022-09-05 MED ORDER — FENTANYL CITRATE (PF) 100 MCG/2ML IJ SOLN
INTRAMUSCULAR | Status: AC
  Filled 2022-09-05: qty 2

## 2022-09-05 NOTE — Progress Notes (Signed)
Patient was given discharge instructions. She verbalized understanding. 

## 2022-09-05 NOTE — Progress Notes (Signed)
Assumed care of pt at this time.

## 2022-09-05 NOTE — Procedures (Addendum)
Interventional Radiology Procedure Note  Procedure: RT LIVER RANDOM CORE BX   Complications: None  Estimated Blood Loss:  MIN  Findings: 54 G CORE X 2    M. Ruel Favors, MD

## 2022-09-06 LAB — SURGICAL PATHOLOGY

## 2022-09-15 ENCOUNTER — Other Ambulatory Visit: Payer: Self-pay | Admitting: Nurse Practitioner

## 2022-09-15 DIAGNOSIS — C220 Liver cell carcinoma: Secondary | ICD-10-CM

## 2022-09-26 ENCOUNTER — Ambulatory Visit
Admission: RE | Admit: 2022-09-26 | Discharge: 2022-09-26 | Disposition: A | Discharge status: Home / Self Care | Payer: Medicaid Other | Source: Ambulatory Visit | Attending: Nurse Practitioner | Admitting: Nurse Practitioner

## 2022-09-26 DIAGNOSIS — C220 Liver cell carcinoma: Secondary | ICD-10-CM

## 2022-09-26 NOTE — Consult Note (Signed)
Chief Complaint: Patient was seen in consultation today for No chief complaint on file.  at the request of Drazek,Dawn  Referring Physician(s): Drazek,Dawn  History of Present Illness: Marissa Warner is a 57 y.o. 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 She was evaluated in the hospital in February 2024 for lower GI bleeding.  During the hospitalization, patient was found to have 1.6 cm right hepatic lesion.  Patient was referred to [facility] care and Transplant center in [place].  Patient had a random liver biopsy performed on 09/05/2022 that demonstrated nodular hepatic parenchyma with increased fibrosis consistent with early cirrhosis.  The MRI from 07/16/2021 suggested that the 1.6 cm lesion in segment 8 is most compatible with a LI-RADS 5.  Patient had several other subcentimeter foci with arterial hyperenhancement that were characterized as LI-RADS 3 lesions.  Patient has started Entecavir for the acute/chronic viral hepatitis B.  Currently, the patient is asymptomatic.  She denies shortness of breath, abdominal pain, abdominal distention or lower extremity swelling.  She has occasional constipation. She has no signs for hepatic encephalopathy.  EGD performed on 07/18/2002 demonstrated normal esophagus, stomach, examined duodenum with a 2 cm hiatal hernia.  Colonoscopy from 07/18/2022 demonstrated hemorrhoids, colonic diverticula.  Resected polyp from the transverse colon demonstrated tubular adenoma.  Patient is not currently eligible for a liver transplant and was referred to Interventional Radiology for liver directed therapy of the highly suspicious hepatic lesion.  Past Medical History:  Diagnosis Date   Allergy    environmental and seasonal   Blood transfusion without reported diagnosis before 2004   due to heavy periods   Hepatitis B    Hypertension    Migraines 2007   Renal disorder    Pt. states "Stage III Kidney disease"   Never had kidney biopsy.  She is unable to tell me what the cause of the kidney disease.  Reportedly diagnosed with [facility] in [place].    Past Surgical History:  Procedure Laterality Date   ABDOMINAL HYSTERECTOMY  2004   With BSO, she thinks   BIOPSY  09/09/2019   Procedure: BIOPSY;  Surgeon: Kerin Salen, MD;  Location: [facility] ENDOSCOPY;  Service: Gastroenterology;;   CHOLECYSTECTOMY  uncertain date   Laparoscopic   COLONOSCOPY N/A 07/18/2022   Procedure: COLONOSCOPY;  Surgeon: Kerin Salen, MD;  Location: [facility] ENDOSCOPY;  Service: Gastroenterology;  Laterality: N/A;   COLONOSCOPY WITH PROPOFOL N/A 09/09/2019   Procedure: COLONOSCOPY WITH PROPOFOL;  Surgeon: Kerin Salen, MD;  Location: [facility] ENDOSCOPY;  Service: Gastroenterology;  Laterality: N/A;   ESOPHAGOGASTRODUODENOSCOPY (EGD) WITH PROPOFOL N/A 09/09/2019   Procedure: ESOPHAGOGASTRODUODENOSCOPY (EGD) WITH PROPOFOL;  Surgeon: Kerin Salen, MD;  Location: [facility] ENDOSCOPY;  Service: Gastroenterology;  Laterality: N/A;   ESOPHAGOGASTRODUODENOSCOPY (EGD) WITH PROPOFOL N/A 07/18/2022   Procedure: ESOPHAGOGASTRODUODENOSCOPY (EGD) WITH PROPOFOL;  Surgeon: Kerin Salen, MD;  Location: [facility] ENDOSCOPY;  Service: Gastroenterology;  Laterality: N/A;   LAPAROSCOPIC INCISIONAL / UMBILICAL / VENTRAL HERNIA REPAIR  2000   POLYPECTOMY  07/18/2022   Procedure: POLYPECTOMY;  Surgeon: Kerin Salen, MD;  Location[facility] ENDOSCOPY;  Service: Gastroenterology;;    Allergies: Patient has no known allergies.  Medications: Prior to Admission medications   Medication Sig Start Date End Date Taking? Authorizing Provider  acetaminophen (TYLENOL) 500 MG tablet Take 500 mg by mouth every 6 (six) hours as needed for mild pain.    [provider]  carvedilol (COREG) 6.25 MG tablet Take 1 tablet (6.25 mg total) by  mouth 2 (two) times daily. 07/02/21   [name], MD  diclofenac Sodium (VOLTAREN) 1 % GEL Apply 4 g topically 4 (four) times daily. Patient  taking differently: Apply 4 g topically 4 (four) times daily as needed (Pain). 06/03/19   Julieanne Manson, MD  entecavir (BARACLUDE) 0.5 MG tablet Take 0.5 mg by mouth daily.    [provider]  fluticasone (FLONASE) 50 MCG/ACT nasal spray Place 1 spray into both nostrils daily.    [provider]  losartan (COZAAR) 25 MG tablet Take 25 mg by mouth daily. 07/11/22   [provider]  polyethylene glycol powder (GLYCOLAX/MIRALAX) 17 GM/SCOOP powder Take by mouth.    [provider]  Vitamin D, Ergocalciferol, (DRISDOL) 1.25 MG (50000 UNIT) CAPS capsule Take 50,000 Units by mouth every 7 (seven) days.    [provider]     No family history on file.  Social History   Socioeconomic History   Marital status: Single    Spouse name: Not on file   Number of children: 4   Years of education: 12   Highest education level: Not on file  Occupational History   Occupation: unemployed    Comment: Previously in housekeeping  Tobacco Use   Smoking status: Never   Smokeless tobacco: Current    Types: Chew   Tobacco comments:    down to once daily or every other day  Substance and Sexual Activity   Alcohol use: Not Currently   Drug use: No   Sexual activity: Never    Partners: Male  Other Topics Concern   Not on file  Social History Narrative   Originally from [place], not far from [place], [place]. [place])   Moved to [place] in March 2017 to be near daughter.   Lives alone near [place].   Social Determinants of Health   Financial Resource Strain: Not on file  Food Insecurity: No Food Insecurity (07/15/2022)   Hunger Vital Sign    Worried About Running Out of Food in the Last Year: Never true    Ran Out of Food in the Last Year: Never true  Transportation Needs: No Transportation Needs (07/15/2022)   PRAPARE - Administrator, Civil Service (Medical): No    Lack of Transportation (Non-Medical): No  Physical Activity: Not  on file  Stress: Not on file  Social Connections: Not on file    ECOG Status: 0 - Asymptomatic  Review of Systems: A 12 point ROS discussed and pertinent positives are indicated in the HPI above.  All other systems are negative.  Review of Systems  Constitutional: Negative.   HENT: Negative.    Respiratory: Negative.    Cardiovascular: Negative.   Gastrointestinal:  Positive for constipation.  Genitourinary: Negative.   Neurological: Negative.     Vital Signs: BP 126/68 (BP Location: Left Arm, Patient Position: Sitting)   Pulse 78   Temp 98 F (36.7 C)   Resp 15   SpO2 100%     Physical Exam Constitutional:      Appearance: Normal appearance. She is not ill-appearing.  Cardiovascular:     Rate and Rhythm: Normal rate and regular rhythm.  Pulmonary:     Effort: Pulmonary effort is normal.     Breath sounds: Normal breath sounds.  Abdominal:     General: Abdomen is flat. Bowel sounds are normal. There is no distension.     Palpations: Abdomen is soft.     Tenderness: There is  no abdominal tenderness.  Musculoskeletal:     Right lower leg: No edema.     Left lower leg: No edema.  Neurological:     Mental Status: She is alert.        Imaging: US BIOPSY (LIVER)  Result Date: 09/05/2022 INDICATION: Elevated LFTs, cirrhosis, other lab abnormalities, positive ASMA EXAM: ULTRASOUND RANDOM RIGHT LIVER CORE BIOPSY MEDICATIONS: 1% LIDOCAINE LOCAL ANESTHESIA/SEDATION: Moderate (conscious) sedation was employed during this procedure. A total of Versed 1.0 mg and Fentanyl 75 mcg was administered intravenously by the radiology nurse. Total intra-service moderate Sedation Time: 10 minutes. The patient's level of consciousness and vital signs were monitored continuously by radiology nursing throughout the procedure under my direct supervision. COMPLICATIONS: None immediate. PROCEDURE: Informed written consent was obtained from the patient after a thorough discussion of the  procedural risks, benefits and alternatives. All questions were addressed. Maximal Sterile Barrier Technique was utilized including caps, mask, sterile gowns, sterile gloves, sterile drape, hand hygiene and skin antiseptic. A timeout was performed prior to the initiation of the procedure. Previous imaging reviewed. Preliminary unformed. The right inferior lobe was localized and marked in the mid axillary line through a lower intercostal space. Under sterile conditions and local anesthesia, the 17 gauge guide needle was advanced into the peripheral right inferior liver. Needle position confirmed with ultrasound. Images obtained for documentation. 2 18 gauge core biopsies obtained through the access of the peripheral right inferior liver under direct ultrasound. Samples were intact and non fragmented. These were placed in formalin. Needle tract occluded with Gel-Foam. Postprocedure imaging demonstrates no hemorrhage or hematoma. Patient tolerated biopsy well. IMPRESSION: Successful ultrasound right liver random core biopsy as above. Electronically Signed   By: Judie Petit.  Shick M.D.   On: 09/05/2022 14:26     CLINICAL DATA:  Cirrhosis.  Hepatic mass on recent ultrasound.   EXAM: MRI ABDOMEN WITHOUT AND WITH CONTRAST   TECHNIQUE: Multiplanar multisequence MR imaging of the abdomen was performed both before and after the administration of intravenous contrast.   CONTRAST:  9mL GADAVIST GADOBUTROL 1 MMOL/ML IV SOLN   COMPARISON:  Ultrasound on 07/15/2022   FINDINGS: Lower chest: No acute findings.   Hepatobiliary: Findings of hepatic cirrhosis are seen. Recanalization paraumbilical veins is consistent with portal venous hypertension.   A mass is seen in segment 8 of the right lobe which measures 1.6 x 1.5 cm, and shows arterial phase hyperenhancement, contrast out washout, and delayed peripheral rim enhancement.   Several other sub-centimeter foci of arterial phase hyperenhancement also seen in the  right hepatic lobe, which are more difficult to characterize due to their small size. These lesions show no definite contrast washout or delayed peripheral rim enhancement.   Prior cholecystectomy. No evidence of biliary obstruction.   Pancreas:  No mass or inflammatory changes.   Spleen:  Within normal limits in size and appearance.   Adrenals/Urinary Tract: No suspicious masses identified. A tiny benign Bosniak category 2 hemorrhagic cyst is seen in the lateral midpole the left kidney. (no followup imaging is recommended). No evidence of hydronephrosis.   Stomach/Bowel: Left-sided colonic diverticulosis, without evidence of diverticulitis in this region. Diffuse mesenteric and body wall edema, without ascites.   Vascular/Lymphatic: No pathologically enlarged lymph nodes identified. No acute vascular findings. Left upper quadrant portosystemic collaterals, consistent with portal venous hypertension.   Other:  None.   Musculoskeletal:  No suspicious bone lesions identified.   IMPRESSION: Hepatic cirrhosis and findings of portal venous hypertension.   1.6 cm mass  in segment 8 of the right hepatic lobe, which is diagnostic for hepatocellular carcinoma. (LI-RADS Category 5: Definitely hepatocellular carcinoma).   Several other sub-centimeter foci of arterial phase hyperenhancement in the right hepatic lobe, which show no contrast washout or peripheral capsular enhancement. (LI-RADS Category 3: Intermediate probability of malignancy). Recommend continued follow-up by abdomen MRI without and with contrast in 6 months.   No evidence of metastatic disease.     Electronically Signed   By: Danae Orleans M.D.   On: 07/16/2022 17:02    Labs:  CBC: Recent Labs    07/15/22 0020 07/15/22 1650 07/16/22 0627 07/16/22 1259 07/17/22 1728 07/18/22 0634 07/27/22 1411 09/05/22 1139  WBC 4.1  --  3.1*  --   --   --  4.7 4.2  HGB 7.6*   < > 6.6*   < > 9.7* 8.8* 9.4* 8.8*  HCT  22.4*   < > 18.8*   < > 28.8* 24.9* 26.8* 26.4*  PLT 81*  --  75*  --   --   --  85* 95*   < > = values in this interval not displayed.    COAGS: Recent Labs    07/14/22 1714 07/15/22 0020 07/16/22 0627 09/05/22 1139  INR 1.6* 1.7* 1.7* 1.5*    BMP: Recent Labs    01/25/22 2244 07/14/22 1309 07/15/22 0020 07/27/22 1411  NA 136 135 133* 138  K 4.1 3.7 3.8 3.4*  CL 109 106 107 109  CO2 21* 24 21* 26  GLUCOSE 114* 134* 121* 87  BUN 16 8 7 10   CALCIUM 9.1 8.5* 8.1* 8.2*  CREATININE 1.72* 1.27* 1.16* 0.89  GFRNONAA 34* 49* 55* >60    LIVER FUNCTION TESTS: Recent Labs    01/25/22 2244 07/14/22 1309 07/15/22 0020 07/27/22 1411  BILITOT 3.6* 2.0* 1.8* 1.9*  AST 1,000* 58* 54* 37  ALT 313* 25 21 13   ALKPHOS 142* 80 85 81  PROT 7.9 7.5 7.4 7.9  ALBUMIN 2.6* 2.0* 1.9* 2.4*    TUMOR MARKERS: AFP 07/27/2022 - 13.1   07/16/2022 - 12.5  Assessment and Plan:  58 year old female with acute/chronic hepatitis B, biopsy-proven early cirrhosis and 1.6 cm hepatic lesion that is most compatible with hepatocellular carcinoma and LI-RADS 5.  Patient was referred to Interventional Radiology to discuss liver directed therapy of the solitary hepatic lesion.  Patient is currently not a surgical or transplant candidate.  I personally reviewed the patient's prior imaging and agree that there is a highly suspicious lesion in the right hepatic lobe near the dome, segment 8.  This lesion has arterial enhancement and contrast washout.  This is compatible with hepatocellular carcinoma based on the hepatitis B and known cirrhosis.  There are additional small hyperenhancing structures in the liver but these are indeterminate at this time.  During the visit, I performed ultrasound examination of the liver and I was able to identify the hypoechoic lesion near the right hepatic dome.  There appears to be space between the lesion and the right hemidiaphragm.  Based on the imaging findings, the patient is  a candidate for image guided microwave ablation of this suspicious hepatic lesion.  We discussed image guided microwave ablation in depth.  Explained to the patient that she would be under general anesthesia.  We discussed the risk of the procedure which include bleeding, infection, incomplete treatment and damage to adjacent structures, particularly the right hemidiaphragm in this case.  We would anticipate an outpatient procedure although she may  require overnight observation if there is any complications or has pain management issues after the procedure.  I believe the patient has a very good understanding with the procedure and she would like to proceed with microwave ablation of this presumed hepatocellular carcinoma.  [facility] care team has requested a follow-up MRI in a few weeks.  Would like to schedule the ablation after the MRI to ensure that the solitary lesion has not significantly enlarged and to make sure that the patient does not have evidence for multifocal disease at this time.  Explained to the patient that microwave ablation would hopefully be a curative treatment for this 1.6 cm lesion but she would be at risk for developing new lesions and will need close imaging follow-up after the procedure.  Patient is comfortable this plan.  We will plan for follow-up MRI in a few weeks and hopefully perform the microwave ablation soon after the MRI exam.  In addition, patient will need follow up labs prior to ablation to evaluate her liver function, platelets and coagulopathy.    Thank you for this interesting consult.  I greatly enjoyed meeting Javonne Pilz and look forward to participating in their care.  A copy of this report was sent to the requesting provider on this date.  Electronically Signed: Arn Medal 09/26/2022, 11:23 AM   I spent a total of  30 Minutes   in face to face in clinical consultation, greater than 50% of which was counseling/coordinating care for cirrhosis and  hepatocellular carcinoma.

## 2022-10-27 ENCOUNTER — Telehealth: Payer: Self-pay | Admitting: Nurse Practitioner

## 2022-10-27 ENCOUNTER — Inpatient Hospital Stay: Payer: Medicaid Other | Admitting: Nurse Practitioner

## 2022-10-27 ENCOUNTER — Inpatient Hospital Stay: Payer: Medicaid Other

## 2022-10-27 NOTE — Progress Notes (Deleted)
Patient Care Team: Marissa Contras, MD as PCP - General (Internal Medicine) Marissa Ishikawa, MD as PCP - Cardiology (Cardiology)   CHIEF COMPLAINT: Follow-up [facility]  Oncology History   No history exists.     CURRENT THERAPY: Pending liver targeted therapy, microwave ablation by IR  INTERVAL HISTORY Marissa Warner returns for routine follow-up as scheduled, last seen as new patient 07/27/2022.  Seen by liver specialist Marissa Major NP who started Entecavir for hep B treatment.  Random liver biopsy 09/05/2022 showed early cirrhosis.  She is not a candidate for liver transplant so was referred to IR for consideration of targeted treatment of the LAD rad 5 liver lesion compatible with HCC.  Microwave ablation is planned after liver MRI on 7/12  ROS   Past Medical History:  Diagnosis Date   Allergy    environmental and seasonal   Blood transfusion without reported diagnosis before 2004   due to heavy periods   Hepatitis B    Hypertension    Migraines 2007   Renal disorder    Pt. states "Stage III Kidney disease"  Never had kidney biopsy.  She is unable to tell me what the cause of the kidney disease.  Reportedly diagnosed with [facility] in [place].     Past Surgical History:  Procedure Laterality Date   ABDOMINAL HYSTERECTOMY  2004   With BSO, she thinks   BIOPSY  09/09/2019   Procedure: BIOPSY;  Surgeon: Marissa Salen, MD;  Location: [facility] ENDOSCOPY;  Service: Gastroenterology;;   CHOLECYSTECTOMY  uncertain date   Laparoscopic   COLONOSCOPY N/A 07/18/2022   Procedure: COLONOSCOPY;  Surgeon: Marissa Salen, MD;  Location: [facility] ENDOSCOPY;  Service: Gastroenterology;  Laterality: N/A;   COLONOSCOPY WITH PROPOFOL N/A 09/09/2019   Procedure: COLONOSCOPY WITH PROPOFOL;  Surgeon: Marissa Salen, MD;  Location: [facility] ENDOSCOPY;  Service: Gastroenterology;  Laterality: N/A;   ESOPHAGOGASTRODUODENOSCOPY (EGD) WITH PROPOFOL N/A 09/09/2019   Procedure: ESOPHAGOGASTRODUODENOSCOPY (EGD) WITH  PROPOFOL;  Surgeon: Marissa Salen, MD;  Location: [facility] ENDOSCOPY;  Service: Gastroenterology;  Laterality: N/A;   ESOPHAGOGASTRODUODENOSCOPY (EGD) WITH PROPOFOL N/A 07/18/2022   Procedure: ESOPHAGOGASTRODUODENOSCOPY (EGD) WITH PROPOFOL;  Surgeon: Marissa Salen, MD;  Location: [facility] ENDOSCOPY;  Service: Gastroenterology;  Laterality: N/A;   LAPAROSCOPIC INCISIONAL / UMBILICAL / VENTRAL HERNIA REPAIR  2000   POLYPECTOMY  07/18/2022   Procedure: POLYPECTOMY;  Surgeon: Marissa Salen, MD;  Location: [facility] ENDOSCOPY;  Service: Gastroenterology;;     Outpatient Encounter Medications as of 10/27/2022  Medication Sig Note   acetaminophen (TYLENOL) 500 MG tablet Take 500 mg by mouth every 6 (six) hours as needed for mild pain.    carvedilol (COREG) 6.25 MG tablet Take 1 tablet (6.25 mg total) by mouth 2 (two) times daily. 07/15/2022: Dispense records do not support patient claim.   diclofenac Sodium (VOLTAREN) 1 % GEL Apply 4 g topically 4 (four) times daily. (Patient taking differently: Apply 4 g topically 4 (four) times daily as needed (Pain).)    entecavir (BARACLUDE) 0.5 MG tablet Take 0.5 mg by mouth daily.    fluticasone (FLONASE) 50 MCG/ACT nasal spray Place 1 spray into both nostrils daily.    losartan (COZAAR) 25 MG tablet Take 25 mg by mouth daily.    polyethylene glycol powder (GLYCOLAX/MIRALAX) 17 GM/SCOOP powder Take by mouth.    Vitamin D, Ergocalciferol, (DRISDOL) 1.25 MG (50000 UNIT) CAPS capsule Take 50,000 Units by mouth every 7 (seven) days.    No facility-administered encounter medications on file as of  10/27/2022.     There were no vitals filed for this visit. There is no height or weight on file to calculate BMI.   PHYSICAL EXAM GENERAL:alert, no distress and comfortable SKIN: no rash  EYES: sclera clear NECK: without mass LYMPH:  no palpable cervical or supraclavicular lymphadenopathy  LUNGS: clear with normal breathing effort HEART: regular rate & rhythm, no lower extremity edema ABDOMEN:  abdomen soft, non-tender and normal bowel sounds NEURO: alert & oriented x 3 with fluent speech, no focal motor/sensory deficits Breast exam:  PAC without erythema    CBC    Component Value Date/Time   WBC 4.2 09/05/2022 1139   RBC 3.33 (L) 09/05/2022 1139   HGB 8.8 (L) 09/05/2022 1139   HGB 9.4 (L) 07/27/2022 1411   HGB 10.7 (L) 06/03/2019 1114   HCT 26.4 (L) 09/05/2022 1139   HCT 35.3 06/03/2019 1114   PLT 95 (L) 09/05/2022 1139   PLT 85 (L) 07/27/2022 1411   PLT 220 06/03/2019 1114   MCV 79.3 (L) 09/05/2022 1139   MCV 77 (L) 06/03/2019 1114   MCH 26.4 09/05/2022 1139   MCHC 33.3 09/05/2022 1139   RDW 16.7 (H) 09/05/2022 1139   RDW 14.5 06/03/2019 1114   LYMPHSABS 1.1 07/27/2022 1411   LYMPHSABS 1.7 06/03/2019 1114   MONOABS 0.7 07/27/2022 1411   EOSABS 0.2 07/27/2022 1411   EOSABS 0.1 06/03/2019 1114   BASOSABS 0.0 07/27/2022 1411   BASOSABS 0.1 06/03/2019 1114     CMP     Component Value Date/Time   NA 138 07/27/2022 1411   NA 141 12/31/2018 1348   K 3.4 (L) 07/27/2022 1411   CL 109 07/27/2022 1411   CO2 26 07/27/2022 1411   GLUCOSE 87 07/27/2022 1411   BUN 10 07/27/2022 1411   BUN 11 12/31/2018 1348   CREATININE 0.89 07/27/2022 1411   CALCIUM 8.2 (L) 07/27/2022 1411   PROT 7.9 07/27/2022 1411   PROT 7.6 12/31/2018 1348   ALBUMIN 2.4 (L) 07/27/2022 1411   ALBUMIN 3.8 12/31/2018 1348   AST 37 07/27/2022 1411   ALT 13 07/27/2022 1411   ALKPHOS 81 07/27/2022 1411   BILITOT 1.9 (H) 07/27/2022 1411   GFRNONAA >60 07/27/2022 1411   GFRAA >60 10/08/2019 1008     ASSESSMENT & PLAN: 57 yo female    Hepatocellular carcinoma  -Pt Is a 57 year-old female with untreated hepatitis B, liver cirrhosis, and recent GI bleeding.  -Child-Pugh score is 8 (class B) -Found to have 1.6 cm liver mass in segment 8 right liver with typical image findings consistent with HCC, and elevated AFP at 12, so diagnosis of HCC is quite certain. We are not recommending biopsy  to  confirm the diagnosis.  -CT chest 08/16/22 negative for distant metastasis  -Seen by Marissa Major, NP liver specialist. She is not a surgical or transplant candidate -Pending microwave ablation by IR in July   Anemia -She has had mild microcytic anemia since at least 10/2015 with hgb 10.1, B12, folate, and iron studies normal in 02/2016 and 07/2016  -Baseline 9-10 range since then, and hgb normal 08/2019 -She presented to ED with rectal bleeding and acute on chronic anemia, hgb down to 6.6 on 07/16/22. S/p 2 units RBCs -Also takes oral iron periodically, although ferritin has been elevated  -EGD/Colonoscopy 07/18/22 showed no active bleeding or varices; bleeding sources was possibly from hemorrhoids and appears to have resolved    HBV -Followed by liver clinic NP Dawn Drazek -  Currently on Entecavir, starting 06/2022 -Encouraged her children to be tested  PLAN:  No orders of the defined types were placed in this encounter.     All questions were answered. The patient knows to call the clinic with any problems, questions or concerns. No barriers to learning were detected. I spent *** counseling the patient face to face. The total time spent in the appointment was *** and more than 50% was on counseling, review of test results, and coordination of care.   Santiago Glad, NP-C @DATE @

## 2022-10-31 NOTE — Progress Notes (Unsigned)
Patient Care Team: Fleet Contras, MD as PCP - General (Internal Medicine) [name], MD as PCP - Cardiology (Cardiology)   CHIEF COMPLAINT: Follow up [facility]  CURRENT THERAPY: PENDING microwave ablation by IR  INTERVAL HISTORY Marissa Warner returns for follow up as scheduled. Seen initially by [place] in 07/2022. Surgery felt her to be a poor surgical candidate due to decompensated cirrhosis. Seen back by liver specialist Annamarie Major NP who felt she is not eligible for transplant and also pt not interested in transplant for social reasons. Referred to IR who is planning targeted therapy after repeat MRI in July.   Today she presents by herself. Feeling well overall, no changes in her health. She is eating/drinking well and walks daily. Manages constipation with miralax. Denies recent infection, bleeding, pain, n/v, or other new/specific complaints.   ROS  All other systems reviewed and negative  Past Medical History:  Diagnosis Date   Allergy    environmental and seasonal   Blood transfusion without reported diagnosis before 2004   due to heavy periods   Hepatitis B    Hypertension    Migraines 2007   Renal disorder    Pt. states "Stage III Kidney disease"  Never had kidney biopsy.  She is unable to tell me what the cause of the kidney disease.  Reportedly diagnosed with [facility] in [place].     Past Surgical History:  Procedure Laterality Date   ABDOMINAL HYSTERECTOMY  2004   With BSO, she thinks   BIOPSY  09/09/2019   Procedure: BIOPSY;  Surgeon: Kerin Salen, MD;  Location: [facility] ENDOSCOPY;  Service: Gastroenterology;;   CHOLECYSTECTOMY  uncertain date   Laparoscopic   COLONOSCOPY N/A 07/18/2022   Procedure: COLONOSCOPY;  Surgeon: Kerin Salen, MD;  Location: [facility] ENDOSCOPY;  Service: Gastroenterology;  Laterality: N/A;   COLONOSCOPY WITH PROPOFOL N/A 09/09/2019   Procedure: COLONOSCOPY WITH PROPOFOL;  Surgeon: Kerin Salen, MD;  Location: [facility] ENDOSCOPY;  Service:  Gastroenterology;  Laterality: N/A;   ESOPHAGOGASTRODUODENOSCOPY (EGD) WITH PROPOFOL N/A 09/09/2019   Procedure: ESOPHAGOGASTRODUODENOSCOPY (EGD) WITH PROPOFOL;  Surgeon: Kerin Salen, MD;  Location: [facility] ENDOSCOPY;  Service: Gastroenterology;  Laterality: N/A;   ESOPHAGOGASTRODUODENOSCOPY (EGD) WITH PROPOFOL N/A 07/18/2022   Procedure: ESOPHAGOGASTRODUODENOSCOPY (EGD) WITH PROPOFOL;  Surgeon: Kerin Salen, MD;  Location: [facility] ENDOSCOPY;  Service: Gastroenterology;  Laterality: N/A;   LAPAROSCOPIC INCISIONAL / UMBILICAL / VENTRAL HERNIA REPAIR  2000   POLYPECTOMY  07/18/2022   Procedure: POLYPECTOMY;  Surgeon: Kerin Salen, MD;  Location: [facility] ENDOSCOPY;  Service: Gastroenterology;;     Outpatient Encounter Medications as of 11/02/2022  Medication Sig Note   acetaminophen (TYLENOL) 500 MG tablet Take 500 mg by mouth every 6 (six) hours as needed for mild pain.    carvedilol (COREG) 6.25 MG tablet Take 1 tablet (6.25 mg total) by mouth 2 (two) times daily. 07/15/2022: Dispense records do not support patient claim.   diclofenac Sodium (VOLTAREN) 1 % GEL Apply 4 g topically 4 (four) times daily. (Patient taking differently: Apply 4 g topically 4 (four) times daily as needed (Pain).)    entecavir (BARACLUDE) 0.5 MG tablet Take 0.5 mg by mouth daily.    fluticasone (FLONASE) 50 MCG/ACT nasal spray Place 1 spray into both nostrils daily.    losartan (COZAAR) 25 MG tablet Take 25 mg by mouth daily.    polyethylene glycol powder (GLYCOLAX/MIRALAX) 17 GM/SCOOP powder Take by mouth.    Vitamin D, Ergocalciferol, (DRISDOL) 1.25 MG (50000 UNIT) CAPS capsule  Take 50,000 Units by mouth every 7 (seven) days.    No facility-administered encounter medications on file as of 11/02/2022.     Today's Vitals   11/02/22 0847  BP: 124/74  Pulse: 71  Resp: 17  Temp: 97.8 F (36.6 C)  TempSrc: Oral  SpO2: 100%  Weight: 176 lb 6 oz (80 kg)  Height: 5\' 5"  (1.651 m)   Body mass index is 29.35 kg/m.   PHYSICAL  EXAM GENERAL:alert, no distress and comfortable SKIN: no rash  EYES: sclera clear NECK: without mass LYMPH:  no palpable cervical or supraclavicular lymphadenopathy  LUNGS: clear with normal breathing effort HEART: regular rate & rhythm, no lower extremity edema ABDOMEN: abdomen soft, non-tender and normal bowel sounds NEURO: alert & oriented x 3 with fluent speech, no focal motor deficits   CBC    Component Value Date/Time   WBC 4.7 11/02/2022 0833   WBC 4.2 09/05/2022 1139   RBC 3.12 (L) 11/02/2022 0833   HGB 8.2 (L) 11/02/2022 0833   HGB 10.7 (L) 06/03/2019 1114   HCT 24.0 (L) 11/02/2022 0833   HCT 35.3 06/03/2019 1114   PLT 96 (L) 11/02/2022 0833   PLT 220 06/03/2019 1114   MCV 76.9 (L) 11/02/2022 0833   MCV 77 (L) 06/03/2019 1114   MCH 26.3 11/02/2022 0833   MCHC 34.2 11/02/2022 0833   RDW 17.2 (H) 11/02/2022 0833   RDW 14.5 06/03/2019 1114   LYMPHSABS 1.3 11/02/2022 0833   LYMPHSABS 1.7 06/03/2019 1114   MONOABS 0.6 11/02/2022 0833   EOSABS 0.1 11/02/2022 0833   EOSABS 0.1 06/03/2019 1114   BASOSABS 0.0 11/02/2022 0833   BASOSABS 0.1 06/03/2019 1114     CMP     Component Value Date/Time   NA 138 07/27/2022 1411   NA 141 12/31/2018 1348   K 3.4 (L) 07/27/2022 1411   CL 109 07/27/2022 1411   CO2 26 07/27/2022 1411   GLUCOSE 87 07/27/2022 1411   BUN 10 07/27/2022 1411   BUN 11 12/31/2018 1348   CREATININE 0.89 07/27/2022 1411   CALCIUM 8.2 (L) 07/27/2022 1411   PROT 7.9 07/27/2022 1411   PROT 7.6 12/31/2018 1348   ALBUMIN 2.4 (L) 07/27/2022 1411   ALBUMIN 3.8 12/31/2018 1348   AST 37 07/27/2022 1411   ALT 13 07/27/2022 1411   ALKPHOS 81 07/27/2022 1411   BILITOT 1.9 (H) 07/27/2022 1411   GFRNONAA >60 07/27/2022 1411   GFRAA >60 10/08/2019 1008     ASSESSMENT & PLAN: 57 yo female    Hepatocellular carcinoma  -h/o untreated hepatitis B, liver cirrhosis, and recent GI bleeding.  -Child-Pugh score is 8 (class B) -Found to have 1.6 cm liver mass in  segment 8 right liver with typical image findings consistent with HCC, and elevated AFP at 12, so diagnosis of HCC is quite certain. biopsy was not recommended.  -CT chest negative for distant metastasis  -To be a poor surgical candidate due to decompensated cirrhosis and not a transplant candidate -Marissa Warner appears stable.  I reviewed nutrition and general symptom management.  No new issues or clinical concern for progression. - I reviewed the plan with pt which includes repeat liver MRI 7/12 then microwave ablation. The goal is curative -Today's labs reviewed, AFP is pending -Follow-up in 3 months, or sooner if needed   Anemia -She has had mild microcytic anemia since at least 10/2015 with hgb 10.1, B12, folate, and iron studies normal in 02/2016 and 07/2016  -Baseline 9-10  range since then, and hgb normal 08/2019 -She presented to ED with rectal bleeding and acute on chronic anemia, hgb down to 6.6 on 07/16/22. S/p 2 units RBCs -Takes oral iron periodically, although ferritin has been elevated  -EGD/Colonoscopy 07/18/22 showed no active bleeding or varices; bleeding sources was possibly from hemorrhoids -Hgb worsened since 08/2022, 8.2 today; denies bleeding   HBV -Followed by liver clinic NP Dawn Drazek -Currently on Entecavir, starting 06/2022; increased ~08/2022 tolerating well -Previously encouraged her children to be tested   PLAN: -CBC reviewed; CMP, ferritin, AFP pending -Liver MRI 7/12 then microwave ablation by IR -Lab and f/up with Dr. Mosetta Putt in 3 months, or sooner if needed    All questions were answered. The patient knows to call the clinic with any problems, questions or concerns. No barriers to learning were detected. I spent 20 minutes counseling the patient face to face. The total time spent in the appointment was 30 minutes and more than 50% was on counseling, review of test results, and coordination of care.   Santiago Glad, NP-C 11/02/2022

## 2022-11-02 ENCOUNTER — Encounter: Payer: Self-pay | Admitting: Nurse Practitioner

## 2022-11-02 ENCOUNTER — Other Ambulatory Visit: Payer: Self-pay

## 2022-11-02 ENCOUNTER — Inpatient Hospital Stay (HOSPITAL_BASED_OUTPATIENT_CLINIC_OR_DEPARTMENT_OTHER): Payer: Medicaid Other | Admitting: Nurse Practitioner

## 2022-11-02 ENCOUNTER — Inpatient Hospital Stay: Payer: Medicaid Other | Attending: Nurse Practitioner

## 2022-11-02 VITALS — BP 124/74 | HR 71 | Temp 97.8°F | Resp 17 | Ht 65.0 in | Wt 176.4 lb

## 2022-11-02 DIAGNOSIS — Z90722 Acquired absence of ovaries, bilateral: Secondary | ICD-10-CM | POA: Diagnosis not present

## 2022-11-02 DIAGNOSIS — Z79899 Other long term (current) drug therapy: Secondary | ICD-10-CM | POA: Insufficient documentation

## 2022-11-02 DIAGNOSIS — Z90710 Acquired absence of both cervix and uterus: Secondary | ICD-10-CM | POA: Diagnosis not present

## 2022-11-02 DIAGNOSIS — K7460 Unspecified cirrhosis of liver: Secondary | ICD-10-CM | POA: Diagnosis not present

## 2022-11-02 DIAGNOSIS — C220 Liver cell carcinoma: Secondary | ICD-10-CM

## 2022-11-02 DIAGNOSIS — Z9049 Acquired absence of other specified parts of digestive tract: Secondary | ICD-10-CM | POA: Insufficient documentation

## 2022-11-02 DIAGNOSIS — D649 Anemia, unspecified: Secondary | ICD-10-CM | POA: Insufficient documentation

## 2022-11-02 LAB — CMP (CANCER CENTER ONLY)
ALT: 13 U/L (ref 0–44)
AST: 30 U/L (ref 15–41)
Albumin: 2.5 g/dL — ABNORMAL LOW (ref 3.5–5.0)
Alkaline Phosphatase: 93 U/L (ref 38–126)
Anion gap: 2 — ABNORMAL LOW (ref 5–15)
BUN: 13 mg/dL (ref 6–20)
CO2: 27 mmol/L (ref 22–32)
Calcium: 8.5 mg/dL — ABNORMAL LOW (ref 8.9–10.3)
Chloride: 111 mmol/L (ref 98–111)
Creatinine: 0.93 mg/dL (ref 0.44–1.00)
GFR, Estimated: >60 mL/min (ref >60)
Glucose, Bld: 108 mg/dL — ABNORMAL HIGH (ref 70–99)
Potassium: 3.6 mmol/L (ref 3.5–5.1)
Sodium: 140 mmol/L (ref 135–145)
Total Bilirubin: 1 mg/dL (ref 0.3–1.2)
Total Protein: 7 g/dL (ref 6.5–8.1)

## 2022-11-02 LAB — CBC WITH DIFFERENTIAL (CANCER CENTER ONLY)
Abs Immature Granulocytes: 0.01 10*3/uL (ref 0.00–0.07)
Basophils Absolute: 0 10*3/uL (ref 0.0–0.1)
Basophils Relative: 1 %
Eosinophils Absolute: 0.1 10*3/uL (ref 0.0–0.5)
Eosinophils Relative: 2 %
HCT: 24 % — ABNORMAL LOW (ref 36.0–46.0)
Hemoglobin: 8.2 g/dL — ABNORMAL LOW (ref 12.0–15.0)
Immature Granulocytes: 0 %
Lymphocytes Relative: 27 %
Lymphs Abs: 1.3 10*3/uL (ref 0.7–4.0)
MCH: 26.3 pg (ref 26.0–34.0)
MCHC: 34.2 g/dL (ref 30.0–36.0)
MCV: 76.9 fL — ABNORMAL LOW (ref 80.0–100.0)
Monocytes Absolute: 0.6 10*3/uL (ref 0.1–1.0)
Monocytes Relative: 12 %
Neutro Abs: 2.8 10*3/uL (ref 1.7–7.7)
Neutrophils Relative %: 58 %
Platelet Count: 96 10*3/uL — ABNORMAL LOW (ref 150–400)
RBC: 3.12 MIL/uL — ABNORMAL LOW (ref 3.87–5.11)
RDW: 17.2 % — ABNORMAL HIGH (ref 11.5–15.5)
WBC Count: 4.7 10*3/uL (ref 4.0–10.5)
nRBC: 0 % (ref 0.0–0.2)

## 2022-11-02 LAB — FERRITIN: Ferritin: 349 ng/mL — ABNORMAL HIGH (ref 11–307)

## 2022-11-03 ENCOUNTER — Telehealth: Payer: Self-pay | Admitting: Nurse Practitioner

## 2022-11-03 NOTE — Telephone Encounter (Signed)
Contacted patient to scheduled appointments. Patient is aware of appointments that are scheduled.   

## 2022-11-04 LAB — AFP TUMOR MARKER: AFP, Serum, Tumor Marker: 14.1 ng/mL — ABNORMAL HIGH (ref 0.0–9.2)

## 2022-11-14 ENCOUNTER — Other Ambulatory Visit: Payer: Self-pay | Admitting: Internal Medicine

## 2022-11-15 LAB — LIPID PANEL
Cholesterol: 242 mg/dL — ABNORMAL HIGH (ref <200)
HDL: 29 mg/dL — ABNORMAL LOW (ref >=50)
LDL Cholesterol (Calc): 188 mg/dL (calc) — ABNORMAL HIGH
Non-HDL Cholesterol (Calc): 213 mg/dL (calc) — ABNORMAL HIGH (ref <130)
Total CHOL/HDL Ratio: 8.3 (calc) — ABNORMAL HIGH (ref <5.0)
Triglycerides: 120 mg/dL (ref <150)

## 2022-11-15 LAB — COMPLETE METABOLIC PANEL WITH GFR
AG Ratio: 0.6 (calc) — ABNORMAL LOW (ref 1.0–2.5)
ALT: 12 U/L (ref 6–29)
AST: 34 U/L (ref 10–35)
Albumin: 2.7 g/dL — ABNORMAL LOW (ref 3.6–5.1)
Alkaline phosphatase (APISO): 99 U/L (ref 37–153)
BUN/Creatinine Ratio: 13 (calc) (ref 6–22)
BUN: 16 mg/dL (ref 7–25)
CO2: 25 mmol/L (ref 20–32)
Calcium: 8.5 mg/dL — ABNORMAL LOW (ref 8.6–10.4)
Chloride: 105 mmol/L (ref 98–110)
Creat: 1.22 mg/dL — ABNORMAL HIGH (ref 0.50–1.03)
Globulin: 4.7 g/dL (calc) — ABNORMAL HIGH (ref 1.9–3.7)
Glucose, Bld: 125 mg/dL — ABNORMAL HIGH (ref 65–99)
Potassium: 3.7 mmol/L (ref 3.5–5.3)
Sodium: 138 mmol/L (ref 135–146)
Total Bilirubin: 1.3 mg/dL — ABNORMAL HIGH (ref 0.2–1.2)
Total Protein: 7.4 g/dL (ref 6.1–8.1)
eGFR: 51 mL/min/{1.73_m2} — ABNORMAL LOW (ref >=60)

## 2022-11-15 LAB — CBC
HCT: 28.9 % — ABNORMAL LOW (ref 35.0–45.0)
Hemoglobin: 9.2 g/dL — ABNORMAL LOW (ref 11.7–15.5)
MCH: 26.1 pg — ABNORMAL LOW (ref 27.0–33.0)
MCHC: 31.8 g/dL — ABNORMAL LOW (ref 32.0–36.0)
MCV: 82.1 fL (ref 80.0–100.0)
Platelets: 97 10*3/uL — ABNORMAL LOW (ref 140–400)
RBC: 3.52 10*6/uL — ABNORMAL LOW (ref 3.80–5.10)
RDW: 16.7 % — ABNORMAL HIGH (ref 11.0–15.0)
WBC: 4.6 10*3/uL (ref 3.8–10.8)

## 2022-11-15 LAB — TSH: TSH: 1.76 mIU/L (ref 0.40–4.50)

## 2022-11-15 LAB — VITAMIN D 25 HYDROXY (VIT D DEFICIENCY, FRACTURES): Vit D, 25-Hydroxy: 67 ng/mL (ref 30–100)

## 2022-12-02 ENCOUNTER — Ambulatory Visit
Admission: RE | Admit: 2022-12-02 | Discharge: 2022-12-02 | Disposition: A | Discharge status: Home / Self Care | Payer: Medicaid Other | Source: Ambulatory Visit | Attending: Nurse Practitioner | Admitting: Nurse Practitioner

## 2022-12-02 DIAGNOSIS — C220 Liver cell carcinoma: Secondary | ICD-10-CM

## 2022-12-02 MED ORDER — GADOPICLENOL 0.5 MMOL/ML IV SOLN
8.0000 mL | Freq: Once | INTRAVENOUS | Status: AC | PRN
  Administered 2022-12-02: 8 mL via INTRAVENOUS

## 2022-12-09 ENCOUNTER — Other Ambulatory Visit: Payer: Self-pay | Admitting: Diagnostic Radiology

## 2022-12-09 DIAGNOSIS — C220 Liver cell carcinoma: Secondary | ICD-10-CM

## 2022-12-14 ENCOUNTER — Other Ambulatory Visit (HOSPITAL_COMMUNITY): Payer: Self-pay | Admitting: Diagnostic Radiology

## 2022-12-14 DIAGNOSIS — C220 Liver cell carcinoma: Secondary | ICD-10-CM

## 2022-12-16 ENCOUNTER — Other Ambulatory Visit: Payer: Self-pay | Admitting: Radiology

## 2022-12-16 DIAGNOSIS — C220 Liver cell carcinoma: Secondary | ICD-10-CM

## 2022-12-17 NOTE — Patient Instructions (Signed)
SURGICAL WAITING ROOM VISITATION Patients having surgery or a procedure may have no more than 2 support people in the waiting area - these visitors may rotate in the visitor waiting room.   Due to an increase in RSV and influenza rates and associated hospitalizations, children ages 58 and under may not visit patients in [facility] hospitals. If the patient needs to stay at the hospital during part of their recovery, the visitor guidelines for inpatient rooms apply.  PRE-OP VISITATION  Pre-op nurse will coordinate an appropriate time for 1 support person to accompany the patient in pre-op.  This support person may not rotate.  This visitor will be contacted when the time is appropriate for the visitor to come back in the pre-op area.  Please refer to the [facility] website for the visitor guidelines for Inpatients (after your surgery is over and you are in a regular room).  You are not required to quarantine at this time prior to your surgery. However, you must do this: Hand Hygiene often Do NOT share personal items Notify your provider if you are in close contact with someone who has COVID or you develop fever 100.4 or greater, new onset of sneezing, cough, sore throat, shortness of breath or body aches.  If you test positive for Covid or have been in contact with anyone that has tested positive in the last 10 days please notify you surgeon.    Your procedure is scheduled on: Wednesday  January 04, 2023   Report to [facility] Main Entrance: [facility] entrance where the [employer] is available.   Report to admitting at: 06:30    AM  Call this number if you have any questions or problems the morning of surgery [phone]  DO NOT EAT OR DRINK ANYTHING AFTER MIDNIGHT THE NIGHT PRIOR TO YOUR SURGERY / PROCEDURE.   FOLLOW BOWEL PREP AND ANY ADDITIONAL PRE OP INSTRUCTIONS YOU RECEIVED FROM YOUR SURGEON'S OFFICE!!!   Oral Hygiene is also important to reduce your risk of infection.         Remember - BRUSH YOUR TEETH THE MORNING OF SURGERY WITH YOUR REGULAR TOOTHPASTE  Do NOT smoke after Midnight the night before surgery.  STOP TAKING all Vitamins, Herbs and supplements 1 week before your surgery.   Take ONLY these medicines the morning of surgery with A SIP OF WATER: Carvedilol (Coreg), entecavir (Baraclude), You may use your Flonase Nasal Spray. You may take Tylenol if needed for pain.                    You may not have any metal on your body including hair pins, jewelry, and body piercing  Do not wear make-up, lotions, powders, perfumes or deodorant  Do not wear nail polish including gel and S&S, artificial / acrylic nails, or any other type of covering on natural nails including finger and toenails. If you have artificial nails, gel coating, etc., that needs to be removed by a nail salon, Please have this removed prior to surgery. Not doing so may mean that your surgery could be cancelled or delayed if the Surgeon or anesthesia staff feels like they are unable to monitor you safely.   Do not shave 48 hours prior to surgery to avoid nicks in your skin which may contribute to postoperative infections.   Contacts, Hearing Aids, dentures or bridgework may not be worn into surgery. DENTURES WILL BE REMOVED PRIOR TO SURGERY PLEASE DO NOT APPLY "Poly grip" OR ADHESIVES!!!  You may bring a small overnight bag with you on the day of surgery, only pack items that are not valuable. [facility] IS NOT RESPONSIBLE   FOR VALUABLES THAT ARE LOST OR STOLEN.   Do not bring your home medications to the hospital. The Pharmacy will dispense medications listed on your medication list to you during your admission in the Hospital.   Please read over the following fact sheets you were given: IF YOU HAVE QUESTIONS ABOUT YOUR PRE-OP INSTRUCTIONS, PLEASE CALL [phone].   [place] - Preparing for Surgery Before surgery, you can play an important role.  Because skin is not sterile,  your skin needs to be as free of germs as possible.  You can reduce the number of germs on your skin by washing with CHG (chlorahexidine gluconate) soap before surgery.  CHG is an antiseptic cleaner which kills germs and bonds with the skin to continue killing germs even after washing. Please DO NOT use if you have an allergy to CHG or antibacterial soaps.  If your skin becomes reddened/irritated stop using the CHG and inform your nurse when you arrive at Short Stay. Do not shave (including legs and underarms) for at least 48 hours prior to the first CHG shower.  You may shave your face/neck.  Please follow these instructions carefully:  1.  Shower with CHG Soap the night before surgery and the  morning of surgery.  2.  If you choose to wash your hair, wash your hair first as usual with your normal  shampoo.  3.  After you shampoo, rinse your hair and body thoroughly to remove the shampoo.                             4.  Use CHG as you would any other liquid soap.  You can apply chg directly to the skin and wash.  Gently with a scrungie or clean washcloth.  5.  Apply the CHG Soap to your body ONLY FROM THE NECK DOWN.   Do not use on face/ open                           Wound or open sores. Avoid contact with eyes, ears mouth and genitals (private parts).                       Wash face,  Genitals (private parts) with your normal soap.             6.  Wash thoroughly, paying special attention to the area where your  surgery  will be performed.  7.  Thoroughly rinse your body with warm water from the neck down.  8.  DO NOT shower/wash with your normal soap after using and rinsing off the CHG Soap.            9.  Pat yourself dry with a clean towel.            10.  Wear clean pajamas.            11.  Place clean sheets on your bed the night of your first shower and do not  sleep with pets.  ON THE DAY OF SURGERY : Do not apply any lotions/deodorants the morning of surgery.  Please wear clean clothes  to the hospital/surgery center.    FAILURE TO FOLLOW THESE INSTRUCTIONS MAY RESULT IN  THE CANCELLATION OF YOUR SURGERY  PATIENT SIGNATURE_________________________________  NURSE SIGNATURE__________________________________  ________________________________________________________________________

## 2022-12-17 NOTE — Progress Notes (Signed)
COVID Vaccine received:  []  No [x]  Yes Date of any COVID positive Test in last 90 days:  None  PCP - Fleet Contras, MD Cardiologist - Epifanio Lesches, MD [facility]:  Annamarie Major, NP  Chest x-ray -  EKG -  07-02-2021  Epic Stress Test - 10-04-2020  Myoview ECHO - 10-04-2020  Epic Cardiac Cath -  Zio Monitor- 11-03-2020  PCR screen: []  Ordered & Completed           []   No Order but Needs PROFEND           [x]   N/A for this surgery  Surgery Plan:  []  Ambulatory                            [x]  Outpatient in bed                            []  Admit  Anesthesia:    [x]  General  []  Spinal                           []   Choice []   MAC  Bowel Prep - [x]  No  []   Yes ______  Pacemaker / ICD device [x]  No []  Yes   Spinal Cord Stimulator:[x]  No []  Yes       History of Sleep Apnea? [x]  No []  Yes   CPAP used?- [x]  No []  Yes    Does the patient monitor blood sugar?          []  No []  Yes  [x]  N/A  Patient has: [x]  NO Hx DM   []  Pre-DM                 []  DM1  []   DM2  Blood Thinner / Instructions:  none Aspirin Instructions:  none  ERAS Protocol Ordered: [x]  No  []  Yes Patient is to be NPO after: Midnight prior  Activity level: Patient is able to climb a flight of stairs without difficulty; [x]  No CP  [x]  No SOB. Patient can perform ADLs without assistance.   Anesthesia review: HTN, Hep B- Cirrhosis, CKD3, anemia. GERD, Hx NSTEMI 09-2020- Demand Ischemia only, Migraines  Patient denies shortness of breath, fever, cough and chest pain at PAT appointment.  Patient verbalized understanding and agreement to the Pre-Surgical Instructions that were given to them at this PAT appointment. Patient was also educated of the need to review these PAT instructions again prior to her surgery.I reviewed the appropriate phone numbers to call if they have any and questions or concerns.

## 2022-12-19 ENCOUNTER — Other Ambulatory Visit: Payer: Self-pay

## 2022-12-19 ENCOUNTER — Ambulatory Visit (HOSPITAL_COMMUNITY)
Admission: RE | Admit: 2022-12-19 | Discharge: 2022-12-19 | Disposition: A | Discharge status: Home / Self Care | Payer: Medicaid Other | Source: Ambulatory Visit | Attending: Radiology | Admitting: Radiology

## 2022-12-19 ENCOUNTER — Encounter (HOSPITAL_COMMUNITY)
Admission: RE | Admit: 2022-12-19 | Discharge: 2022-12-19 | Disposition: A | Discharge status: Home / Self Care | Payer: Medicaid Other | Source: Ambulatory Visit | Attending: Diagnostic Radiology | Admitting: Diagnostic Radiology

## 2022-12-19 ENCOUNTER — Encounter (HOSPITAL_COMMUNITY): Payer: Self-pay

## 2022-12-19 DIAGNOSIS — Z01818 Encounter for other preprocedural examination: Secondary | ICD-10-CM | POA: Insufficient documentation

## 2022-12-19 DIAGNOSIS — C220 Liver cell carcinoma: Secondary | ICD-10-CM | POA: Diagnosis not present

## 2022-12-19 DIAGNOSIS — Z01812 Encounter for preprocedural laboratory examination: Secondary | ICD-10-CM | POA: Diagnosis present

## 2022-12-19 LAB — COMPREHENSIVE METABOLIC PANEL
ALT: 19 U/L (ref 0–44)
AST: 37 U/L (ref 15–41)
Albumin: 2.9 g/dL — ABNORMAL LOW (ref 3.5–5.0)
Alkaline Phosphatase: 86 U/L (ref 38–126)
Anion gap: 7 (ref 5–15)
BUN: 18 mg/dL (ref 6–20)
CO2: 22 mmol/L (ref 22–32)
Calcium: 8.9 mg/dL (ref 8.9–10.3)
Chloride: 106 mmol/L (ref 98–111)
Creatinine, Ser: 1.14 mg/dL — ABNORMAL HIGH (ref 0.44–1.00)
GFR, Estimated: 55 mL/min — ABNORMAL LOW (ref >60)
Glucose, Bld: 90 mg/dL (ref 70–99)
Potassium: 3.8 mmol/L (ref 3.5–5.1)
Sodium: 135 mmol/L (ref 135–145)
Total Bilirubin: 1.5 mg/dL — ABNORMAL HIGH (ref 0.3–1.2)
Total Protein: 8 g/dL (ref 6.5–8.1)

## 2022-12-19 LAB — CBC WITH DIFFERENTIAL/PLATELET
Abs Immature Granulocytes: 0.02 10*3/uL (ref 0.00–0.07)
Basophils Absolute: 0.1 10*3/uL (ref 0.0–0.1)
Basophils Relative: 1 %
Eosinophils Absolute: 0.1 10*3/uL (ref 0.0–0.5)
Eosinophils Relative: 2 %
HCT: 30 % — ABNORMAL LOW (ref 36.0–46.0)
Hemoglobin: 10 g/dL — ABNORMAL LOW (ref 12.0–15.0)
Immature Granulocytes: 0 %
Lymphocytes Relative: 29 %
Lymphs Abs: 1.6 10*3/uL (ref 0.7–4.0)
MCH: 26 pg (ref 26.0–34.0)
MCHC: 33.3 g/dL (ref 30.0–36.0)
MCV: 77.9 fL — ABNORMAL LOW (ref 80.0–100.0)
Monocytes Absolute: 0.6 10*3/uL (ref 0.1–1.0)
Monocytes Relative: 11 %
Neutro Abs: 3.1 10*3/uL (ref 1.7–7.7)
Neutrophils Relative %: 57 %
Platelets: 118 10*3/uL — ABNORMAL LOW (ref 150–400)
RBC: 3.85 MIL/uL — ABNORMAL LOW (ref 3.87–5.11)
RDW: 17 % — ABNORMAL HIGH (ref 11.5–15.5)
WBC: 5.5 10*3/uL (ref 4.0–10.5)
nRBC: 0 % (ref 0.0–0.2)

## 2022-12-20 ENCOUNTER — Other Ambulatory Visit: Payer: Self-pay

## 2022-12-30 ENCOUNTER — Other Ambulatory Visit: Payer: Self-pay | Admitting: Radiology

## 2022-12-30 DIAGNOSIS — C220 Liver cell carcinoma: Secondary | ICD-10-CM

## 2023-01-03 ENCOUNTER — Encounter (HOSPITAL_COMMUNITY): Payer: Self-pay | Admitting: Diagnostic Radiology

## 2023-01-03 NOTE — H&P (Signed)
Referring Physician(s): Drazek,D  Supervising Physician: Richarda Overlie  Patient Status:  [facility]  Chief Complaint:  Liver lesion compatible with hepatocellular carcinoma  Subjective: Pt known to IR team from random liver biopsy on 09/05/2022.  She is a 57 year old female with past medical history significant for hepatitis B, hypertension, migraines, renal insufficiency, early cirrhosis and probable hepatocellular carcinoma.  Recent MRI of the abdomen on 7/18 revealed:  1. 2.5 x 2.5 cm segment 8 liver lesion diagnostic for HCC (LI-RADS 5 with interval growth). 2. 16 x 14 mm rim like capsular enhancing lesion near the gallbladder fossa in segment 6 is highly suspicious for a second HCC and although technically indeterminate (as discussed above) I would still classify this as a LI-RADS 5 with interval growth. 3. On the prior MRI there were a few other small arterial phase enhancing lesions. These are not observed on today's study but the arterial phase sequence is definitely later phase than on the prior study and I believe we actually missed the arterial phase on the prior study. Recommend continued surveillance. 4. Stable changes of portal venous hypertension with portal venous collaterals.  Following discussions with Dr. Lowella Dandy patient was deemed an appropriate candidate for image guided microwave ablation/possible biopsy of the liver lesions/HCC and presents today for the procedure. She denies fever, HA,CP,dyspnea, cough, abd/back pain,N/V or bleeding. She does feel bloated, constipated.      Past Medical History:  Diagnosis Date   Allergy    environmental and seasonal   Blood transfusion without reported diagnosis before 2004   due to heavy periods   Hepatitis B    Hypertension    Migraines 2007   Renal disorder    Pt. states "Stage III Kidney disease"  Never had kidney biopsy.  She is unable to tell me what the cause of the kidney disease.  Reportedly diagnosed with  [facility] in [place].   Past Surgical History:  Procedure Laterality Date   ABDOMINAL HYSTERECTOMY  2004   With BSO, she thinks   BIOPSY  09/09/2019   Procedure: BIOPSY;  Surgeon: Kerin Salen, MD;  Location: [facility] ENDOSCOPY;  Service: Gastroenterology;;   CHOLECYSTECTOMY  uncertain date   Laparoscopic   COLONOSCOPY N/A 07/18/2022   Procedure: COLONOSCOPY;  Surgeon: Kerin Salen, MD;  Location: [facility] ENDOSCOPY;  Service: Gastroenterology;  Laterality: N/A;   COLONOSCOPY WITH PROPOFOL N/A 09/09/2019   Procedure: COLONOSCOPY WITH PROPOFOL;  Surgeon: Kerin Salen, MD;  Location: [facility] ENDOSCOPY;  Service: Gastroenterology;  Laterality: N/A;   ESOPHAGOGASTRODUODENOSCOPY (EGD) WITH PROPOFOL N/A 09/09/2019   Procedure: ESOPHAGOGASTRODUODENOSCOPY (EGD) WITH PROPOFOL;  Surgeon: Kerin Salen, MD;  Location: [facility] ENDOSCOPY;  Service: Gastroenterology;  Laterality: N/A;   ESOPHAGOGASTRODUODENOSCOPY (EGD) WITH PROPOFOL N/A 07/18/2022   Procedure: ESOPHAGOGASTRODUODENOSCOPY (EGD) WITH PROPOFOL;  Surgeon: Kerin Salen, MD;  Location: [facility] ENDOSCOPY;  Service: Gastroenterology;  Laterality: N/A;   LAPAROSCOPIC INCISIONAL / UMBILICAL / VENTRAL HERNIA REPAIR  2000   POLYPECTOMY  07/18/2022   Procedure: POLYPECTOMY;  Surgeon: Kerin Salen, MD;  Location: [facility] ENDOSCOPY;  Service: Gastroenterology;;        Socioeconomic History   Marital status: Single      Spouse name: Not on file   Number of children: 4   Years of education: 3   Highest education level: Not on file  Occupational History   Occupation: unemployed      Comment: Previously in housekeeping  Tobacco Use   Smoking status: Never   Smokeless tobacco: Current  Types: Chew   Tobacco comments:      down to once daily or every other day  Substance and Sexual Activity   Alcohol use: Not Currently   Drug use: No   Sexual activity: Never      Partners: Male  Other Topics Concern   Not on file  Social History Narrative    Originally from [place], [place] far from [place], [place]. [place])    Moved to [place] in March 2017 to be near daughter.    Lives alone near [place]       Allergies: Patient has no known allergies.  Medications: Prior to Admission medications   Medication Sig Start Date End Date Taking? Authorizing Provider  acetaminophen (TYLENOL) 500 MG tablet Take 500-1,000 mg by mouth every 6 (six) hours as needed for mild pain.   Yes [provider]  carvedilol (COREG) 6.25 MG tablet Take 1 tablet (6.25 mg total) by mouth 2 (two) times daily. 07/02/21  Yes [name], MD  entecavir (BARACLUDE) 1 MG tablet Take 1 mg by mouth daily.   Yes [provider]  fluticasone (FLONASE) 50 MCG/ACT nasal spray Place 1 spray into both nostrils daily.   Yes [provider]  losartan (COZAAR) 25 MG tablet Take 25 mg by mouth daily. 07/11/22  Yes [provider]  polyethylene glycol powder (GLYCOLAX/MIRALAX) 17 GM/SCOOP powder Take 17 g by mouth daily as needed for moderate constipation.   Yes [provider]  Vitamin D, Ergocalciferol, (DRISDOL) 1.25 MG (50000 UNIT) CAPS capsule Take 50,000 Units by mouth every Monday.   Yes [provider]  diclofenac Sodium (VOLTAREN) 1 % GEL Apply 4 g topically 4 (four) times daily. Patient not taking: Reported on 12/16/2022 06/03/19   Julieanne Manson, MD     Vital Signs: Vitals:   01/04/23 0656  BP: 115/67  Pulse: 81  Resp: 16  Temp: 98.5 F (36.9 C)  SpO2: 100%      Code Status: FULL CODE  Physical Exam: awake/alert; chest- CTA bilat; heart- RRR; abd- soft,+BS,NT; no sig LE edema  Imaging: No results found.  Labs:  CBC: Recent Labs    09/05/22 1139 11/02/22 0833 11/14/22 1050 12/19/22 0846  WBC 4.2 4.7 4.6 5.5  HGB 8.8* 8.2* 9.2* 10.0*  HCT 26.4* 24.0* 28.9* 30.0*  PLT 95* 96* 97* 118*    COAGS: Recent Labs    07/14/22 1714 07/15/22 0020 07/16/22 0627 09/05/22 1139  INR 1.6* 1.7* 1.7*  1.5*    BMP: Recent Labs    07/15/22 0020 07/27/22 1411 11/02/22 0833 11/14/22 1050 12/19/22 0846  NA 133* 138 140 138 135  K 3.8 3.4* 3.6 3.7 3.8  CL 107 109 111 105 106  CO2 21* 26 27 25 22   GLUCOSE 121* 87 108* 125* 90  BUN 7 10 13 16 18   CALCIUM 8.1* 8.2* 8.5* 8.5* 8.9  CREATININE 1.16* 0.89 0.93 1.22* 1.14*  GFRNONAA 55* >60 >60  --  55*    LIVER FUNCTION TESTS: Recent Labs    07/15/22 0020 07/27/22 1411 11/02/22 0833 11/14/22 1050 12/19/22 0846  BILITOT 1.8* 1.9* 1.0 1.3* 1.5*  AST 54* 37 30 34 37  ALT 21 13 13 12 19   ALKPHOS 85 81 93  --  86  PROT 7.4 7.9 7.0 7.4 8.0  ALBUMIN 1.9* 2.4* 2.5*  --  2.9*    Assessment and Plan: 57 year old female with past medical history significant for hepatitis B, hypertension, migraines, renal  insufficiency, early cirrhosis and probable hepatocellular carcinoma.  Recent MRI of the abdomen on 7/18 revealed:  1. 2.5 x 2.5 cm segment 8 liver lesion diagnostic for HCC (LI-RADS 5 with interval growth). 2. 16 x 14 mm rim like capsular enhancing lesion near the gallbladder fossa in segment 6 is highly suspicious for a second HCC and although technically indeterminate (as discussed above) I would still classify this as a LI-RADS 5 with interval growth. 3. On the prior MRI there were a few other small arterial phase enhancing lesions. These are not observed on today's study but the arterial phase sequence is definitely later phase than on the prior study and I believe we actually missed the arterial phase on the prior study. Recommend continued surveillance. 4. Stable changes of portal venous hypertension with portal venous collaterals.  Following discussions with Dr. Lowella Dandy patient was deemed an appropriate candidate for image guided microwave ablation/possible biopsy of the liver lesions/HCC and presents today for the procedure.  Details/risks of procedure, including but not limited to, internal bleeding, infection, injury to  adjacent structures, anesthesia related complications discussed with patient with her understanding and consent.   This procedure involves the use of CT and because of the nature of the planned procedure, it is possible that we will have prolonged use of CT.  Potential radiation risks to you include (but are not limited to) the following: - A slightly elevated risk for cancer  several years later in life. This risk is typically less than 0.5% percent. This risk is low in comparison to the normal incidence of human cancer, which is 33% for women and 50% for men according to the [employer] Society. - Radiation induced injury can include skin redness, resembling a rash, tissue breakdown / ulcers and hair loss (which can be temporary or permanent).   The likelihood of either of these occurring depends on the difficulty of the procedure and whether you are sensitive to radiation due to previous procedures, disease, or genetic conditions.   IF your procedure requires a prolonged use of radiation, you will be notified and given written instructions for further action.  It is your responsibility to monitor the irradiated area for the 2 weeks following the procedure and to notify your physician if you are concerned that you have suffered a radiation induced injury.    Hgb 8.1 today  Electronically Signed: D. Jeananne Rama, PA-C 01/03/2023, 1:08 PM   I spent a total of 30 minutes at the the patient's bedside AND on the patient's hospital floor or unit, greater than 50% of which was counseling/coordinating care for image guided microwave ablation of liver lesion

## 2023-01-03 NOTE — Anesthesia Preprocedure Evaluation (Signed)
Anesthesia Evaluation  Patient identified by MRN, date of birth, ID band Patient awake    Reviewed: Allergy & Precautions, NPO status , Patient's Chart, lab work & pertinent test results  Airway Mallampati: I  TM Distance: >3 FB Neck ROM: Full    Dental  (+) Edentulous Upper, Missing, Poor Dentition, Dental Advisory Given   Pulmonary neg pulmonary ROS   breath sounds clear to auscultation       Cardiovascular hypertension, + Past MI   Rhythm:Regular Rate:Normal     Neuro/Psych  Headaches  negative psych ROS   GI/Hepatic ,GERD  ,,(+) Hepatitis -, B  Endo/Other  negative endocrine ROS    Renal/GU Renal disease     Musculoskeletal   Abdominal   Peds  Hematology negative hematology ROS (+)   Anesthesia Other Findings   Reproductive/Obstetrics                             Anesthesia Physical Anesthesia Plan  ASA: 2  Anesthesia Plan: General   Post-op Pain Management:    Induction: Intravenous  PONV Risk Score and Plan: 4 or greater and Ondansetron, Dexamethasone, Midazolam and Scopolamine patch - Pre-op  Airway Management Planned: Oral ETT  Additional Equipment: None  Intra-op Plan:   Post-operative Plan: Extubation in OR  Informed Consent: I have reviewed the patients History and Physical, chart, labs and discussed the procedure including the risks, benefits and alternatives for the proposed anesthesia with the patient or authorized representative who has indicated his/her understanding and acceptance.     Dental advisory given  Plan Discussed with: CRNA  Anesthesia Plan Comments:        Anesthesia Quick Evaluation

## 2023-01-04 ENCOUNTER — Ambulatory Visit (HOSPITAL_COMMUNITY)
Admission: RE | Admit: 2023-01-04 | Discharge: 2023-01-04 | Disposition: A | Discharge status: Home / Self Care | Payer: Medicaid Other | Source: Ambulatory Visit | Attending: Diagnostic Radiology | Admitting: Diagnostic Radiology

## 2023-01-04 ENCOUNTER — Other Ambulatory Visit: Payer: Self-pay

## 2023-01-04 ENCOUNTER — Encounter (HOSPITAL_COMMUNITY)
Admission: RE | Disposition: A | Discharge status: Home / Self Care | Payer: Self-pay | Source: Ambulatory Visit | Attending: Diagnostic Radiology

## 2023-01-04 ENCOUNTER — Ambulatory Visit (HOSPITAL_BASED_OUTPATIENT_CLINIC_OR_DEPARTMENT_OTHER): Payer: Medicaid Other | Admitting: Physician Assistant

## 2023-01-04 ENCOUNTER — Encounter (HOSPITAL_COMMUNITY): Payer: Self-pay | Admitting: Diagnostic Radiology

## 2023-01-04 ENCOUNTER — Encounter (HOSPITAL_COMMUNITY): Payer: Self-pay

## 2023-01-04 ENCOUNTER — Ambulatory Visit (HOSPITAL_COMMUNITY): Payer: Medicaid Other | Admitting: Physician Assistant

## 2023-01-04 DIAGNOSIS — I10 Essential (primary) hypertension: Secondary | ICD-10-CM | POA: Insufficient documentation

## 2023-01-04 DIAGNOSIS — N289 Disorder of kidney and ureter, unspecified: Secondary | ICD-10-CM | POA: Insufficient documentation

## 2023-01-04 DIAGNOSIS — K7460 Unspecified cirrhosis of liver: Secondary | ICD-10-CM | POA: Insufficient documentation

## 2023-01-04 DIAGNOSIS — C220 Liver cell carcinoma: Secondary | ICD-10-CM | POA: Insufficient documentation

## 2023-01-04 HISTORY — PX: RADIOLOGY WITH ANESTHESIA: SHX6223

## 2023-01-04 LAB — COMPREHENSIVE METABOLIC PANEL
ALT: 17 U/L (ref 0–44)
AST: 33 U/L (ref 15–41)
Albumin: 2.5 g/dL — ABNORMAL LOW (ref 3.5–5.0)
Alkaline Phosphatase: 73 U/L (ref 38–126)
Anion gap: 4 — ABNORMAL LOW (ref 5–15)
BUN: 17 mg/dL (ref 6–20)
CO2: 23 mmol/L (ref 22–32)
Calcium: 8.4 mg/dL — ABNORMAL LOW (ref 8.9–10.3)
Chloride: 109 mmol/L (ref 98–111)
Creatinine, Ser: 1.11 mg/dL — ABNORMAL HIGH (ref 0.44–1.00)
GFR, Estimated: 57 mL/min — ABNORMAL LOW (ref >60)
Glucose, Bld: 94 mg/dL (ref 70–99)
Potassium: 4.1 mmol/L (ref 3.5–5.1)
Sodium: 136 mmol/L (ref 135–145)
Total Bilirubin: 0.9 mg/dL (ref 0.3–1.2)
Total Protein: 6.9 g/dL (ref 6.5–8.1)

## 2023-01-04 LAB — CBC WITH DIFFERENTIAL/PLATELET
Abs Immature Granulocytes: 0.01 10*3/uL (ref 0.00–0.07)
Basophils Absolute: 0.1 10*3/uL (ref 0.0–0.1)
Basophils Relative: 1 %
Eosinophils Absolute: 0.1 10*3/uL (ref 0.0–0.5)
Eosinophils Relative: 2 %
HCT: 24.3 % — ABNORMAL LOW (ref 36.0–46.0)
Hemoglobin: 8.1 g/dL — ABNORMAL LOW (ref 12.0–15.0)
Immature Granulocytes: 0 %
Lymphocytes Relative: 29 %
Lymphs Abs: 1.4 10*3/uL (ref 0.7–4.0)
MCH: 26.2 pg (ref 26.0–34.0)
MCHC: 33.3 g/dL (ref 30.0–36.0)
MCV: 78.6 fL — ABNORMAL LOW (ref 80.0–100.0)
Monocytes Absolute: 0.6 10*3/uL (ref 0.1–1.0)
Monocytes Relative: 12 %
Neutro Abs: 2.8 10*3/uL (ref 1.7–7.7)
Neutrophils Relative %: 56 %
Platelets: 91 10*3/uL — ABNORMAL LOW (ref 150–400)
RBC: 3.09 MIL/uL — ABNORMAL LOW (ref 3.87–5.11)
RDW: 17 % — ABNORMAL HIGH (ref 11.5–15.5)
WBC: 5 10*3/uL (ref 4.0–10.5)
nRBC: 0 % (ref 0.0–0.2)

## 2023-01-04 LAB — TYPE AND SCREEN
ABO/RH(D): A POS
Antibody Screen: NEGATIVE

## 2023-01-04 LAB — PROTIME-INR
INR: 1.4 — ABNORMAL HIGH (ref 0.8–1.2)
Prothrombin Time: 17.3 seconds — ABNORMAL HIGH (ref 11.4–15.2)

## 2023-01-04 SURGERY — CT WITH ANESTHESIA
Anesthesia: General

## 2023-01-04 MED ORDER — ACETAMINOPHEN 10 MG/ML IV SOLN
1000.0000 mg | Freq: Once | INTRAVENOUS | Status: DC | PRN
  Administered 2023-01-04: 1000 mg via INTRAVENOUS

## 2023-01-04 MED ORDER — PROMETHAZINE HCL 25 MG/ML IJ SOLN: 6.2500 mg | INTRAMUSCULAR | Status: DC | PRN

## 2023-01-04 MED ORDER — LIDOCAINE 2% (20 MG/ML) 5 ML SYRINGE
INTRAMUSCULAR | Status: DC | PRN
  Administered 2023-01-04: 50 mg via INTRAVENOUS

## 2023-01-04 MED ORDER — FENTANYL CITRATE PF 50 MCG/ML IJ SOSY
PREFILLED_SYRINGE | INTRAMUSCULAR | Status: AC
  Administered 2023-01-04: 50 ug via INTRAVENOUS
  Filled 2023-01-04: qty 3

## 2023-01-04 MED ORDER — SCOPOLAMINE 1 MG/3DAYS TD PT72
1.0000 | MEDICATED_PATCH | TRANSDERMAL | Status: DC
  Administered 2023-01-04: 1.5 mg via TRANSDERMAL
  Filled 2023-01-04: qty 1

## 2023-01-04 MED ORDER — MIDAZOLAM HCL 5 MG/5ML IJ SOLN
INTRAMUSCULAR | Status: DC | PRN
  Administered 2023-01-04: 2 mg via INTRAVENOUS

## 2023-01-04 MED ORDER — ACETAMINOPHEN 160 MG/5ML PO SOLN: 325.0000 mg | ORAL | Status: DC | PRN

## 2023-01-04 MED ORDER — SODIUM CHLORIDE 0.9 % IV SOLN
2.0000 g | Freq: Once | INTRAVENOUS | Status: AC
  Administered 2023-01-04: 2 g via INTRAVENOUS
  Filled 2023-01-04: qty 2

## 2023-01-04 MED ORDER — FENTANYL CITRATE (PF) 250 MCG/5ML IJ SOLN
INTRAMUSCULAR | Status: AC
  Filled 2023-01-04: qty 5

## 2023-01-04 MED ORDER — CHLORHEXIDINE GLUCONATE 0.12 % MT SOLN
15.0000 mL | Freq: Once | OROMUCOSAL | Status: AC
  Administered 2023-01-04: 15 mL via OROMUCOSAL

## 2023-01-04 MED ORDER — ACETAMINOPHEN 10 MG/ML IV SOLN
INTRAVENOUS | Status: AC
  Filled 2023-01-04: qty 100

## 2023-01-04 MED ORDER — ONDANSETRON HCL 4 MG/2ML IJ SOLN
INTRAMUSCULAR | Status: DC | PRN
  Administered 2023-01-04: 4 mg via INTRAVENOUS

## 2023-01-04 MED ORDER — PROPOFOL 10 MG/ML IV BOLUS
INTRAVENOUS | Status: DC | PRN
Start: 2023-01-04 — End: 2023-01-04
  Administered 2023-01-04: 130 mg via INTRAVENOUS

## 2023-01-04 MED ORDER — LACTATED RINGERS IV SOLN: INTRAVENOUS | Status: DC

## 2023-01-04 MED ORDER — DEXAMETHASONE SODIUM PHOSPHATE 4 MG/ML IJ SOLN
INTRAMUSCULAR | Status: DC | PRN
  Administered 2023-01-04: 5 mg via INTRAVENOUS

## 2023-01-04 MED ORDER — MIDAZOLAM HCL 2 MG/2ML IJ SOLN
INTRAMUSCULAR | Status: AC
  Filled 2023-01-04: qty 2

## 2023-01-04 MED ORDER — AMISULPRIDE (ANTIEMETIC) 5 MG/2ML IV SOLN: 10.0000 mg | Freq: Once | INTRAVENOUS | Status: DC | PRN

## 2023-01-04 MED ORDER — OXYCODONE HCL 5 MG PO TABS
5.0000 mg | ORAL_TABLET | Freq: Once | ORAL | Status: AC | PRN
  Administered 2023-01-04: 5 mg via ORAL

## 2023-01-04 MED ORDER — ORAL CARE MOUTH RINSE: 15.0000 mL | Freq: Once | OROMUCOSAL | Status: AC

## 2023-01-04 MED ORDER — OXYCODONE HCL 5 MG PO TABS
ORAL_TABLET | ORAL | Status: AC
  Filled 2023-01-04: qty 1

## 2023-01-04 MED ORDER — SODIUM CHLORIDE 0.9 % IV SOLN
INTRAVENOUS | Status: AC
  Filled 2023-01-04: qty 2

## 2023-01-04 MED ORDER — FENTANYL CITRATE (PF) 100 MCG/2ML IJ SOLN
INTRAMUSCULAR | Status: DC | PRN
  Administered 2023-01-04: 50 ug via INTRAVENOUS
  Administered 2023-01-04 (×2): 100 ug via INTRAVENOUS

## 2023-01-04 MED ORDER — ACETAMINOPHEN 325 MG PO TABS: 325.0000 mg | ORAL_TABLET | ORAL | Status: DC | PRN

## 2023-01-04 MED ORDER — HYDROCODONE-ACETAMINOPHEN 5-325 MG PO TABS: 1.0000 | ORAL_TABLET | Freq: Four times a day (QID) | ORAL | 0 refills | Status: AC | PRN

## 2023-01-04 MED ORDER — OXYCODONE HCL 5 MG/5ML PO SOLN: 5.0000 mg | Freq: Once | ORAL | Status: AC | PRN

## 2023-01-04 MED ORDER — FENTANYL CITRATE PF 50 MCG/ML IJ SOSY
25.0000 ug | PREFILLED_SYRINGE | INTRAMUSCULAR | Status: DC | PRN
  Administered 2023-01-04 (×2): 50 ug via INTRAVENOUS

## 2023-01-04 MED ORDER — SUGAMMADEX SODIUM 200 MG/2ML IV SOLN
INTRAVENOUS | Status: DC | PRN
  Administered 2023-01-04: 200 mg via INTRAVENOUS

## 2023-01-04 MED ORDER — ROCURONIUM BROMIDE 10 MG/ML (PF) SYRINGE
PREFILLED_SYRINGE | INTRAVENOUS | Status: DC | PRN
  Administered 2023-01-04: 20 mg via INTRAVENOUS
  Administered 2023-01-04: 60 mg via INTRAVENOUS
  Administered 2023-01-04: 20 mg via INTRAVENOUS

## 2023-01-04 NOTE — Procedures (Signed)
Interventional Radiology Procedure:   Indications: Cirrhosis with 2 suspicious hepatic lesions (most compatible with HCC)  Procedure: Image guided microwave ablation of segment 8 and segment 6 lesions  Findings: 2 PR XT probes used to treat the larger segment 8 lesion at the dome.  These lesions were treated with 5 minute ablation.  1 PR XT probe used to treat the smaller right hepatic lesion for 7 minutes.   Complications: None     EBL: Minimal  Plan: Bedrest 3 hours.  Anticipate discharge to home today.    R. Lowella Dandy, MD  Pager: [phone]

## 2023-01-04 NOTE — Transfer of Care (Signed)
Immediate Anesthesia Transfer of Care Note  Patient: Aerionna Waithe  Procedure(s) Performed: CT WITH ANESTHESIA MICROWAVE ABLATION  Patient Location: PACU  Anesthesia Type:General  Level of Consciousness: awake and patient cooperative  Airway & Oxygen Therapy: Patient Spontanous Breathing and Patient connected to face mask  Post-op Assessment: Report given to RN and Post -op Vital signs reviewed and stable  Post vital signs: Reviewed and stable  Last Vitals:  Vitals Value Taken Time  BP 137/64 01/04/23 1131  Temp    Pulse 93 01/04/23 1134  Resp 24 01/04/23 1134  SpO2 100 % 01/04/23 1134  Vitals shown include unfiled device data.  Last Pain:  Vitals:   01/04/23 0656  TempSrc: Oral  PainSc: 0-No pain         Complications: No notable events documented.

## 2023-01-04 NOTE — Anesthesia Procedure Notes (Signed)
Procedure Name: Intubation Date/Time: 01/04/2023 8:50 AM  Performed by: Vanessa Benbrook, CRNAPre-anesthesia Checklist: Patient identified, Emergency Drugs available, Suction available and Patient being monitored Patient Re-evaluated:Patient Re-evaluated prior to induction Oxygen Delivery Method: Circle system utilized Preoxygenation: Pre-oxygenation with 100% oxygen Induction Type: IV induction Ventilation: Mask ventilation without difficulty Laryngoscope Size: 2 and Miller Grade View: Grade I Tube type: Oral Tube size: 7.0 mm Number of attempts: 1 Airway Equipment and Method: Stylet Placement Confirmation: ETT inserted through vocal cords under direct vision, positive ETCO2 and breath sounds checked- equal and bilateral Secured at: 21 cm Tube secured with: Tape Dental Injury: Teeth and Oropharynx as per pre-operative assessment

## 2023-01-04 NOTE — Progress Notes (Signed)
Patient ID: Marissa Warner, female   DOB: 1965/11/07, 57 y.o.   MRN: [id number] Patient seen in PACU with Dr. Lowella Dandy, status post CT-guided microwave ablation of segment 6 and 8 liver lesions earlier today.  Vital signs stable, afebrile, patient does have some mild right upper quadrant tenderness to palpation.  Denies fever, headache, chest pain, dyspnea, cough, nausea, vomiting or bleeding.  Patient awake, slightly drowsy, chest clear to auscultation bilaterally.  Heart with regular rate and rhythm.  Abdomen soft, positive bowel sounds, mildly tender right upper quadrant to palpation, intact gauze dressing, no obvious bleeding or hematoma.  No lower extremity edema.  Able to tolerate her diet and void without difficulty.  Will plan discharge home today.  Patient will resume home medications.  Electronic prescription for Vicodin sent to patient's pharmacy.  Will plan to follow-up with patient in IR clinic in 3 to 4 weeks.  She was told to contact our team with any additional questions or concerns.

## 2023-01-04 NOTE — Sedation Documentation (Signed)
Anesthesia at bedside to sedate and monitor. 

## 2023-01-04 NOTE — Anesthesia Postprocedure Evaluation (Signed)
Anesthesia Post Note  Patient: Marissa Warner  Procedure(s) Performed: CT WITH ANESTHESIA MICROWAVE ABLATION     Patient location during evaluation: PACU Anesthesia Type: General Level of consciousness: awake and alert Pain management: pain level controlled Vital Signs Assessment: post-procedure vital signs reviewed and stable Respiratory status: spontaneous breathing, nonlabored ventilation, respiratory function stable and patient connected to nasal cannula oxygen Cardiovascular status: blood pressure returned to baseline and stable Postop Assessment: no apparent nausea or vomiting Anesthetic complications: no  No notable events documented.  Last Vitals:  Vitals:   01/04/23 1408 01/04/23 1501  BP:  132/87  Pulse: 85 84  Resp:    Temp:    SpO2: 100% 100%    Last Pain:  Vitals:   01/04/23 1339  TempSrc:   PainSc: Asleep                 Shelton Silvas

## 2023-01-05 ENCOUNTER — Encounter (HOSPITAL_COMMUNITY): Payer: Self-pay | Admitting: Diagnostic Radiology

## 2023-01-06 LAB — AFP TUMOR MARKER: AFP, Serum, Tumor Marker: 20.4 ng/mL — ABNORMAL HIGH (ref 0.0–9.2)

## 2023-01-16 ENCOUNTER — Ambulatory Visit
Admission: RE | Admit: 2023-01-16 | Discharge: 2023-01-16 | Disposition: A | Discharge status: Home / Self Care | Payer: Medicaid Other | Source: Ambulatory Visit | Attending: Radiology | Admitting: Radiology

## 2023-01-16 DIAGNOSIS — C220 Liver cell carcinoma: Secondary | ICD-10-CM

## 2023-01-16 NOTE — Progress Notes (Signed)
Chief Complaint: Patient was consulted remotely today (TeleHealth) for follow-up after liver lesion ablations  Referring Physician(s): Drazek, Bernie Covey, Marcos Eke  History of Present Illness: Marissa Warner is a 57 y.o. female with history of hepatitis B, biopsy-proven early cirrhosis and 2 LI-RAD 5 liver lesions.  Patient underwent image guided microwave ablation of 2 hepatic lesions on 01/04/2023.  Larger lesion was at the hepatic dome in segment 8 and a smaller lesion within the right hepatic lobe in segment 5/6.  Patient was discharged the same day following the procedure.  She reports having pain for 2 days after the procedure and she took her prescribed pain medicines.  The pain has now resolved.  She denies fevers, chills or diaphoresis.  No change in her appetite.  She has resumed her normal daily activities.  Patient has no complaints at this time.  Past Medical History:  Diagnosis Date   Allergy    environmental and seasonal   Blood transfusion without reported diagnosis before 2004   due to heavy periods   Hepatitis B    Hypertension    Migraines 2007   Renal disorder    Pt. states "Stage III Kidney disease"  Never had kidney biopsy.  She is unable to tell me what the cause of the kidney disease.  Reportedly diagnosed with [facility] in [place].    Past Surgical History:  Procedure Laterality Date   ABDOMINAL HYSTERECTOMY  2004   With BSO, she thinks   BIOPSY  09/09/2019   Procedure: BIOPSY;  Surgeon: Kerin Salen, MD;  Location: [facility] ENDOSCOPY;  Service: Gastroenterology;;   CHOLECYSTECTOMY  uncertain date   Laparoscopic   COLONOSCOPY N/A 07/18/2022   Procedure: COLONOSCOPY;  Surgeon: Kerin Salen, MD;  Location: [facility] ENDOSCOPY;  Service: Gastroenterology;  Laterality: N/A;   COLONOSCOPY WITH PROPOFOL N/A 09/09/2019   Procedure: COLONOSCOPY WITH PROPOFOL;  Surgeon: Kerin Salen, MD;  Location: [facility] ENDOSCOPY;  Service: Gastroenterology;  Laterality: N/A;    ESOPHAGOGASTRODUODENOSCOPY (EGD) WITH PROPOFOL N/A 09/09/2019   Procedure: ESOPHAGOGASTRODUODENOSCOPY (EGD) WITH PROPOFOL;  Surgeon: Kerin Salen, MD;  Location: [facility] ENDOSCOPY;  Service: Gastroenterology;  Laterality: N/A;   ESOPHAGOGASTRODUODENOSCOPY (EGD) WITH PROPOFOL N/A 07/18/2022   Procedure: ESOPHAGOGASTRODUODENOSCOPY (EGD) WITH PROPOFOL;  Surgeon: Kerin Salen, MD;  Location: [facility] ENDOSCOPY;  Service: Gastroenterology;  Laterality: N/A;   LAPAROSCOPIC INCISIONAL / UMBILICAL / VENTRAL HERNIA REPAIR  2000   POLYPECTOMY  07/18/2022   Procedure: POLYPECTOMY;  Surgeon: Kerin Salen, MD;  Location: [facility] ENDOSCOPY;  Service: Gastroenterology;;   RADIOLOGY WITH ANESTHESIA N/A 01/04/2023   Procedure: CT WITH ANESTHESIA MICROWAVE ABLATION;  Surgeon: Richarda Overlie, MD;  Location: [facility] ORS;  Service: Anesthesiology;  Laterality: N/A;    Allergies: Patient has no known allergies.  Medications: Prior to Admission medications   Medication Sig Start Date End Date Taking? Authorizing Provider  acetaminophen (TYLENOL) 500 MG tablet Take 500-1,000 mg by mouth every 6 (six) hours as needed for mild pain.    [provider]  carvedilol (COREG) 6.25 MG tablet Take 1 tablet (6.25 mg total) by mouth 2 (two) times daily. 07/02/21   [name], MD  diclofenac Sodium (VOLTAREN) 1 % GEL Apply 4 g topically 4 (four) times daily. Patient not taking: Reported on 12/16/2022 06/03/19   Julieanne Manson, MD  entecavir (BARACLUDE) 1 MG tablet Take 1 mg by mouth daily.    [provider]  fluticasone (FLONASE) 50 MCG/ACT nasal spray Place 1 spray into both nostrils daily.  [provider]  HYDROcodone-acetaminophen (NORCO) 5-325 MG tablet Take 1 tablet by mouth every 6 (six) hours as needed for moderate pain. 01/04/23   Allred, Darrell K, PA-C  losartan (COZAAR) 25 MG tablet Take 25 mg by mouth daily. 07/11/22   [provider]  polyethylene glycol powder (GLYCOLAX/MIRALAX) 17 GM/SCOOP  powder Take 17 g by mouth daily as needed for moderate constipation.    [provider]  Vitamin D, Ergocalciferol, (DRISDOL) 1.25 MG (50000 UNIT) CAPS capsule Take 50,000 Units by mouth every Monday.    [provider]     No family history on file.  Social History   Socioeconomic History   Marital status: Single    Spouse name: Not on file   Number of children: 4   Years of education: 12   Highest education level: Not on file  Occupational History   Occupation: unemployed    Comment: Previously in housekeeping  Tobacco Use   Smoking status: Never   Smokeless tobacco: Current    Types: Chew   Tobacco comments:    down to once daily or every other day  Vaping Use   Vaping status: Never Used  Substance and Sexual Activity   Alcohol use: Not Currently   Drug use: No   Sexual activity: Never    Partners: Male  Other Topics Concern   Not on file  Social History Narrative   Originally from [place], [place] far from [place], [employer]. [place])   Moved to [place] in March 2017 to be near daughter.   Lives alone near [place].   Social Determinants of Health   Financial Resource Strain: Not on file  Food Insecurity: No Food Insecurity (07/15/2022)   Hunger Vital Sign    Worried About Running Out of Food in the Last Year: Never true    Ran Out of Food in the Last Year: Never true  Recent Concern: Food Insecurity - Medium Risk (06/15/2022)   [facility]   Food vital sign    Within the past 12 months, you worried that your food would run out before you got money to buy more: Sometimes true    Within the past 12 months, the food you bought just didn't last and you didn't have money to get more. : Sometimes true  Transportation Needs: No Transportation Needs (07/15/2022)   PRAPARE - Administrator, Civil Service (Medical): No    Lack of Transportation (Non-Medical): No  Recent Concern: Transportation Needs - Unmet  Transportation Needs (06/15/2022)   [facility]   Transportation    In the past 12 months, has lack of reliable transportation kept you from medical appointments, meetings, work or from getting things needed for daily living? : Yes  Physical Activity: Not on file  Stress: Not on file  Social Connections: Not on file    ECOG Status: 0 - Asymptomatic  Review of Systems  Constitutional:  Negative for activity change, chills, diaphoresis and fever.  Gastrointestinal:  Negative for abdominal pain.     Physical Exam No direct physical exam was performed   Vital Signs: There were no vitals taken for this visit.  Imaging: CT GUIDE TISSUE ABLATION  Result Date: 01/04/2023 INDICATION: 57 year old with cirrhosis and 2 suspicious liver lesions. Both of these lesions were considered LI-RADS 5 on MRI. EXAM: 1. Image guided microwave ablation of a hepatic lesion at the liver dome, segment 8 2. Image guided microwave  ablation of a hepatic lesion in the right hepatic lobe, segment 5/6 COMPARISON:  None Available. MEDICATIONS: Mefoxin 2 g ANESTHESIA/SEDATION: General - as administered by the Anesthesia department FLUOROSCOPY: Radiation Exposure Index (as provided by the fluoroscopic device): 0 mGy Kerma COMPLICATIONS: None immediate. TECHNIQUE: Informed written consent was obtained from the patient after a thorough discussion of the procedural risks, benefits and alternatives. All questions were addressed. A timeout was performed prior to the initiation of the procedure. Patient was placed under general anesthesia. Patient was placed supine on the CT scanner with the right side slightly elevated. Liver was evaluated with CT and ultrasound. Both liver lesions were identified. Right side of the abdomen was prepped with chlorhexidine and sterile field was created. Maximal barrier sterile technique was utilized including caps, mask, sterile gowns, sterile gloves, sterile drape,  hand hygiene and skin antiseptic. Using ultrasound guidance, a new wave PR XT probe was directed towards the liver dome lesion. Needle placement was very difficult due to location of the needle and adjacent lung. Needle position was confirmed within the lesion using ultrasound and CT guidance. A second PR XT probe lesion was directed into the hepatic dome lesion using ultrasound and CT guidance. Needle was positioned just beyond the lesion. The hepatic dome lesion was treated using both probes for a total of 5 minutes. Cautery function was used in both probes prior to removal. Attention was directed to the other lesion in the right hepatic lobe in the segment 5/6 region. This lesion was visible by ultrasound. One PR probe was directed into the lesion using ultrasound guidance. The probe was placed just beyond the lesion. This lesion was then treated for 7 minutes. Cautery function was used and the needle was removed. Follow up CT images were obtained. Bandages were placed. FINDINGS: Lesion at the right hepatic dome in the segment 8 region. This lesion measured at least 2.5 cm. Both probes were confirmed within the lesion. Smaller hepatic lesion in the right hepatic lobe in the segment 5/6 region. Lesion measured up to 1.8 cm on ultrasound. Ablation probe confirmed within the lesion. Post ablation CT images demonstrate a small amount of gas at both ablation sites. No evidence for perihepatic fluid or hematoma. Negative for a pneumothorax. Dependent consolidation in left lower lobe compatible with atelectasis. IMPRESSION: 1. Successful image guided microwave ablation of 2 hepatic lesions. Largest lesion is at the hepatic dome, segment 8. Smaller lesion is in the right hepatic lobe in segment 5/6. Electronically Signed   By: Richarda Overlie M.D.   On: 01/04/2023 15:34   DG Chest 1 View  Result Date: 12/20/2022 CLINICAL DATA:  Preop exam for liver ablation. Hepatocellular carcinoma. EXAM: CHEST  1 VIEW COMPARISON:  Chest  CT 08/16/2022 FINDINGS: The cardiomediastinal contours are normal. The lungs are clear. Pulmonary vasculature is normal. No consolidation, pleural effusion, or pneumothorax. No acute osseous abnormalities are seen. IMPRESSION: Negative radiograph of the chest. Electronically Signed   By: Narda Rutherford M.D.   On: 12/20/2022 20:54    Labs:  CBC: Recent Labs    11/02/22 0833 11/14/22 1050 12/19/22 0846 01/04/23 0718  WBC 4.7 4.6 5.5 5.0  HGB 8.2* 9.2* 10.0* 8.1*  HCT 24.0* 28.9* 30.0* 24.3*  PLT 96* 97* 118* 91*    COAGS: Recent Labs    07/15/22 0020 07/16/22 0627 09/05/22 1139 01/04/23 0718  INR 1.7* 1.7* 1.5* 1.4*    BMP: Recent Labs    07/27/22 1411 11/02/22 0833 11/14/22 1050 12/19/22 0846 01/04/23  0718  NA 138 140 138 135 136  K 3.4* 3.6 3.7 3.8 4.1  CL 109 111 105 106 109  CO2 26 27 25 22 23   GLUCOSE 87 108* 125* 90 94  BUN 10 13 16 18 17   CALCIUM 8.2* 8.5* 8.5* 8.9 8.4*  CREATININE 0.89 0.93 1.22* 1.14* 1.11*  GFRNONAA >60 >60  --  55* 57*    LIVER FUNCTION TESTS: Recent Labs    07/27/22 1411 11/02/22 0833 11/14/22 1050 12/19/22 0846 01/04/23 0718  BILITOT 1.9* 1.0 1.3* 1.5* 0.9  AST 37 30 34 37 33  ALT 13 13 12 19 17   ALKPHOS 81 93  --  86 73  PROT 7.9 7.0 7.4 8.0 6.9  ALBUMIN 2.4* 2.5*  --  2.9* 2.5*    TUMOR MARKERS: No results for input(s): "AFPTM", "CEA", "CA199", "CHROMGRNA" in the last 8760 hours.  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  Patient underwent successful image guided microwave ablation of the 2 hepatic lesions on 01/04/2023.  Patient has recovered well following the microwave ablations.  She is currently asymptomatic.  Will plan for follow-up MRI of the liver, with and without contrast, and follow-up AFP level in 3 months.  Patient will contact us if she has any questions or concerns in the interim.   Electronically Signed: Arn Medal 01/16/2023,  9:41 AM   I spent a total of    10 Minutes in remote  clinical consultation, greater than 50% of which was counseling/coordinating care for treated liver lesions.    Visit type: Audio only (telephone). Audio (no video) only due to patient preference. Alternative for in-person consultation at [facility], Patient ID: Marissa Warner, female   DOB: June 24, 1965, 57 y.o.   MRN: [id number]

## 2023-02-01 ENCOUNTER — Inpatient Hospital Stay: Payer: Medicaid Other | Attending: Nurse Practitioner

## 2023-02-01 ENCOUNTER — Inpatient Hospital Stay: Payer: Medicaid Other | Admitting: Hematology

## 2023-02-01 NOTE — Assessment & Plan Note (Deleted)
She has had mild microcytic anemia since at least 10/2015 with hgb 10.1, B12, folate, and iron studies normal in 02/2016 and 07/2016  -Baseline 9-10 range since then, and hgb normal 08/2019 -She presented to ED with rectal bleeding and acute on chronic anemia, hgb down to 6.6 on 07/16/22. S/p 2 units RBCs -Takes oral iron periodically, although ferritin has been elevated  -EGD/Colonoscopy 07/18/22 showed no active bleeding or varices; bleeding sources was possibly from hemorrhoids

## 2023-02-01 NOTE — Assessment & Plan Note (Deleted)
-  h/o untreated hepatitis B, liver cirrhosis, and recent GI bleeding.  -Child-Pugh score is 8 (class B) -Found to have 1.6 cm liver mass in segment 8 right liver with typical image findings consistent with HCC, and elevated AFP at 12, so diagnosis of HCC is quite certain. biopsy was not recommended.  -CT chest negative for distant metastasis  -To be a poor surgical candidate due to decompensated cirrhosis and not a transplant candidate -She underwentimage guided microwave ablation of 2 hepatic lesions by IR Dr. Lowella Dandy on 01/04/2023 -Continue cancer surveillance.

## 2023-05-30 ENCOUNTER — Other Ambulatory Visit: Payer: Self-pay | Admitting: Diagnostic Radiology

## 2023-05-30 DIAGNOSIS — C220 Liver cell carcinoma: Secondary | ICD-10-CM

## 2023-06-09 ENCOUNTER — Ambulatory Visit (HOSPITAL_COMMUNITY)
Admission: RE | Admit: 2023-06-09 | Discharge: 2023-06-09 | Disposition: A | Discharge status: Home / Self Care | Payer: Medicaid Other | Source: Ambulatory Visit | Attending: Diagnostic Radiology | Admitting: Diagnostic Radiology

## 2023-06-09 ENCOUNTER — Other Ambulatory Visit (HOSPITAL_COMMUNITY)
Admission: RE | Admit: 2023-06-09 | Discharge: 2023-06-09 | Disposition: A | Discharge status: Home / Self Care | Payer: Medicaid Other | Source: Ambulatory Visit | Attending: Diagnostic Radiology | Admitting: Diagnostic Radiology

## 2023-06-09 DIAGNOSIS — C220 Liver cell carcinoma: Secondary | ICD-10-CM | POA: Insufficient documentation

## 2023-06-09 MED ORDER — GADOBUTROL 1 MMOL/ML IV SOLN
7.3000 mL | Freq: Once | INTRAVENOUS | Status: AC | PRN
  Administered 2023-06-09: 7.3 mL via INTRAVENOUS

## 2023-06-10 LAB — AFP TUMOR MARKER: AFP, Serum, Tumor Marker: 10.4 ng/mL — ABNORMAL HIGH (ref 0.0–9.2)

## 2023-06-14 ENCOUNTER — Ambulatory Visit
Admission: RE | Admit: 2023-06-14 | Discharge: 2023-06-14 | Disposition: A | Discharge status: Home / Self Care | Payer: Medicaid Other | Source: Ambulatory Visit | Attending: Diagnostic Radiology | Admitting: Diagnostic Radiology

## 2023-06-14 DIAGNOSIS — C220 Liver cell carcinoma: Secondary | ICD-10-CM

## 2023-06-14 NOTE — Progress Notes (Addendum)
Chief Complaint: Patient was consulted remotely today (TeleHealth) for follow-up of hepatocellular carcinoma.   Referring Physician(s): Drazek, Bernie Covey, Marcos Eke  History of Present Illness: Marissa Warner is a 58 y.o. female with history of hepatitis B, biopsy-proven early cirrhosis and 2 LI-RAD 5 liver lesions.  Patient underwent image guided microwave ablation of 2 hepatic lesions on 01/04/2023.  Larger lesion was at the hepatic dome in segment 8 and a smaller lesion within the right hepatic lobe in segment 5/6. Patient had follow-up MRI imaging on 06/09/2023.  At the time of this telephone visit, the MRI report was not available.  Patient has no complaints at this time.  She denies chest pain, shortness of breath or abdominal pain.  No urinary or bowel issues.  She says that she is scheduled to follow-up with hepatology in February.  Past Medical History:  Diagnosis Date   Allergy    environmental and seasonal   Blood transfusion without reported diagnosis before 2004   due to heavy periods   Hepatitis B    Hypertension    Migraines 2007   Renal disorder    Pt. states "Stage III Kidney disease"  Never had kidney biopsy.  She is unable to tell me what the cause of the kidney disease.  Reportedly diagnosed with [facility] in [place].    Past Surgical History:  Procedure Laterality Date   ABDOMINAL HYSTERECTOMY  2004   With BSO, she thinks   BIOPSY  09/09/2019   Procedure: BIOPSY;  Surgeon: Kerin Salen, MD;  Location: [facility] ENDOSCOPY;  Service: Gastroenterology;;   CHOLECYSTECTOMY  uncertain date   Laparoscopic   COLONOSCOPY N/A 07/18/2022   Procedure: COLONOSCOPY;  Surgeon: Kerin Salen, MD;  Location: [facility] ENDOSCOPY;  Service: Gastroenterology;  Laterality: N/A;   COLONOSCOPY WITH PROPOFOL N/A 09/09/2019   Procedure: COLONOSCOPY WITH PROPOFOL;  Surgeon: Kerin Salen, MD;  Location: [facility] ENDOS[facility];  Service: Gastroenterology;  Laterality: N/A;   ESOPHAGOGASTRODUODENOSCOPY  (EGD) WITH PROPOFOL N/A 09/09/2019   Procedure: ESOPHAGOGASTRODUODENOSCOPY (EGD) WITH PROPOFOL;  Surgeon: Kerin Salen, MD;  Location: [facility]OS[facility];  Service: Gastroenterology;  Laterality: N/A;   ESOPHAGOGASTRODUODENOSCOPY (EGD) WITH PROPOFOL N/A 07/18/2022   Procedure: ESOPHAGOGASTRODUODENOSCOPY (EGD) WITH PROPOFOL;  Surgeon: Kerin Salen, MD;  Location: [facility] ENDOSCOPY;  Service: Gastroenterology;  Laterality: N/A;   LAPAROSCOPIC INCISIONAL / UMBILICAL / VENTRAL HERNIA REPAIR  2000   POLYPECTOMY  07/18/2022   Procedure: POLYPECTOMY;  Surgeon: Kerin Salen, MD;  Location: [facility] ENDOSCOPY;  Service: Gastroenterology;;   RADIOLOGY WITH ANESTHESIA N/A 01/04/2023   Procedure: CT WITH ANESTHESIA MICROWAVE ABLATION;  Surgeon: Richarda Overlie, MD;  Location: [facility] ORS;  Service: Anesthesiology;  Laterality: N/A;    Allergies: Patient has no known allergies.  Medications: Prior to Admission medications   Medication Sig Start Date End Date Taking? Authorizing Provider  acetaminophen (TYLENOL) 500 MG tablet Take 500-1,000 mg by mouth every 6 (six) hours as needed for mild pain.    [provider]  carvedilol (COREG) 6.25 MG tablet Take 1 tablet (6.25 mg total) by mouth 2 (two) times daily. 07/02/21   [name], MD  diclofenac Sodium (VOLTAREN) 1 % GEL Apply 4 g topically 4 (four) times daily. Patient not taking: Reported on 12/16/2022 06/03/19   Julieanne Manson, MD  entecavir (BARACLUDE) 1 MG tablet Take 1 mg by mouth daily.    [provider]  fluticasone (FLONASE) 50 MCG/ACT nasal spray Place 1 spray into both nostrils daily.    [provider]  HYDROcodone-acetaminophen (NORCO) 5-325 MG tablet Take 1 tablet by mouth every 6 (six) hours as needed for moderate pain. 01/04/23   Allred, Darrell K, PA-C  losartan (COZAAR) 25 MG tablet Take 25 mg by mouth daily. 07/11/22   [provider]  polyethylene glycol powder (GLYCOLAX/MIRALAX) 17 GM/SCOOP powder Take 17 g by mouth  daily as needed for moderate constipation.    [provider]  Vitamin D, Ergocalciferol, (DRISDOL) 1.25 MG (50000 UNIT) CAPS capsule Take 50,000 Units by mouth every Monday.    [provider]     No family history on file.  Social History   Socioeconomic History   Marital status: Single    Spouse name: Not on file   Number of children: 4   Years of education: 12   Highest education level: Not on file  Occupational History   Occupation: unemployed    Comment: Previously in housekeeping  Tobacco Use   Smoking status: Never   Smokeless tobacco: Current    Types: Chew   Tobacco comments:    down to once daily or every other day  Vaping Use   Vaping status: Never Used  Substance and Sexual Activity   Alcohol use: Not Currently   Drug use: No   Sexual activity: Never    Partners: Male  Other Topics Concern   Not on file  Social History Narrative   Originally from [place], not far from [place], [place]. [place])   Moved to [place] in March 2017 to be near daughter.   Lives alone near [place].   Social Drivers of Corporate investment banker Strain: Not on file  Food Insecurity: No Food Insecurity (07/15/2022)   Hunger Vital Sign    Worried About Running Out of Food in the Last Year: Never true    Ran Out of Food in the Last Year: Never true  Recent Concern: Food Insecurity - Medium Risk (06/15/2022)   [facility]   Hunger Vital Sign    Worried About Running Out of Food in the Last Year: Sometimes true    Ran Out of Food in the Last Year: Sometimes true  Transportation Needs: No Transportation Needs (07/15/2022)   PRAPARE - Administrator, Civil Service (Medical): No    Lack of Transportation (Non-Medical): No  Recent Concern: Transportation Needs - Unmet Transportation Needs (06/15/2022)   [facility]   Transportation    In the past 12 months, has lack of reliable  transportation kept you from medical appointments, meetings, work or from getting things needed for daily living? : Yes  Physical Activity: Not on file  Stress: Not on file  Social Connections: Not on file      Review of Systems  Constitutional: Negative.   Respiratory: Negative.    Cardiovascular: Negative.   Gastrointestinal: Negative.   Genitourinary: Negative.       Physical Exam No direct physical exam was performed   Vital Signs: There were no vitals taken for this visit.  Imaging: MR LIVER W WO CONTRAST Result Date: 06/14/2023 CLINICAL DATA:  Follow-up hepatocellular carcinoma. Previous microwave ablation. EXAM: MRI ABDOMEN WITHOUT AND WITH CONTRAST TECHNIQUE: Multiplanar multisequence MR imaging of the abdomen was performed both before and after the administration of intravenous contrast. CONTRAST:  7.88mL GADAVIST GADOBUTROL 1 MMOL/ML IV SOLN COMPARISON:  12/02/2022 and 07/16/2022 FINDINGS: Lower chest: No acute findings. Hepatobiliary: Hepatic cirrhosis again demonstrated. No No evidence of biliary ductal  dilatation. Portosystemic venous collaterals again seen in the left quadrant, consistent with portal venous hypertension. 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 Ablation defect showing T1 hyperintense blood products is now seen in segment 8 at the site of the previous hypervascular mass. T1 hyperintense blood products limit evaluation on dynamic subtraction imaging, but there is evidence of enhancement within the periphery of the defect which shows some irregularity. This is atypical for treatment expected enhancement, but does not meet criteria for probably or definitely viable. (LR-TR equivocal) Ablation defect showing T1 hyperintense blood products is now seen in segment 6 at the site of the previous hypervascular mass. No suspicious residual contrast enhancement is seen at this site to suggest viable neoplasm. (LR-TR  nonviable) A lesion showing arterial phase hyperenhancement is seen in the posterior dome of segment 8 which measures 2.0 x 1.4 cm on image 28/801. This appears increased in size from approximately 5 mm on earlier study on 07/16/2022. This lesion shows no definite contrast washout or delayed capsular enhancement, but because of interval growth, is consistent with a LI-RADS 5 lesion. Another lesion showing arterial phase hyperenhancement is seen in segment 8 which measures 1.3 x 0.9 cm on image 44/801. This has increased in size from 5 mm on 07/16/2022 exam, and also shows contrast washout and delayed capsular enhancement, consistent with a LI-RADS 5 lesion. Pancreas: No mass or inflammatory changes. Spleen:  Within normal limits in size and appearance. Adrenals/Urinary Tract: No suspicious masses identified. Tiny subcapsular Bosniak category 2 hemorrhagic cyst again seen in the left kidney. (No followup imaging is recommended) No evidence of hydronephrosis. Stomach/Bowel: Unremarkable. Vascular/Lymphatic: No pathologically enlarged lymph nodes identified. No acute vascular findings. Other:  None. Musculoskeletal:  No suspicious bone lesions identified. IMPRESSION: Interval ablation of segment 8 hepatocellular carcinoma, which shows atypical peripheral enhancement, but does not meet criteria for probably or definitely viable tumor. (LR-TR equivocal). Interval ablation of segment 6 hepatocellular carcinoma. No evidence of viable tumor at this site (LR-TR nonviable). Two enlarging hypervascular masses in segment 8, consistent with LI-RADS 5 lesions. (LI-RADS Category 5: Definitely HCC) No evidence of extrahepatic metastatic disease. Hepatic cirrhosis and portal venous hypertension. 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 Electronically Signed   By: Danae Orleans M.D.   On: 06/14/2023 13:24    Labs:  CBC: Recent Labs    11/02/22 0833 11/14/22 1050 12/19/22 0846  01/04/23 0718  WBC 4.7 4.6 5.5 5.0  HGB 8.2* 9.2* 10.0* 8.1*  HCT 24.0* 28.9* 30.0* 24.3*  PLT 96* 97* 118* 91*    COAGS: Recent Labs    07/15/22 0020 07/16/22 0627 09/05/22 1139 01/04/23 0718  INR 1.7* 1.7* 1.5* 1.4*    BMP: Recent Labs    07/27/22 1411 11/02/22 0833 11/14/22 1050 12/19/22 0846 01/04/23 0718  NA 138 140 138 135 136  K 3.4* 3.6 3.7 3.8 4.1  CL 109 111 105 106 109  CO2 26 27 25 22 23   GLUCOSE 87 108* 125* 90 94  BUN 10 13 16 18 17   CALCIUM 8.2* 8.5* 8.5* 8.9 8.4*  CREATININE 0.89 0.93 1.22* 1.14* 1.11*  GFRNONAA >60 >60  --  55* 57*    LIVER FUNCTION TESTS: Recent Labs    07/27/22 1411 11/02/22 0833 11/14/22 1050 12/19/22 0846 01/04/23 0718  BILITOT 1.9* 1.0 1.3* 1.5* 0.9  AST 37 30 34 37 33  ALT 13 13 12 19  17  ALKPHOS 81 93  --  86 73  PROT 7.9 7.0 7.4 8.0 6.9  ALBUMIN 2.4* 2.5*  --  2.9* 2.5*    TUMOR MARKERS: AFP: 10.4 (05/30/23) AFP: 20.4 (01/04/23) AFP:  14.1 (11/02/22)     Assessment and Plan:  58 year old with cirrhosis and presumed hepatocellular carcinoma.  Patient underwent image guided microwave ablation of 2 LI-RADS 5 lesions on 01/04/2023.  Patient tolerated the percutaneous ablations well.  Recent follow-up imaging demonstrates expected post ablation changes with equivocal enhancement involving the segment 8 lesion.  However, there are 2 suspicious enlarged small lesions in segment 8.  These lesions measure up to 2.0 cm and 1.3 cm.  I agree that these lesions are suspicious.  The 2.0 cm lesion in segment 8 would potentially be amenable to additional microwave ablation.  The other lesion is at the dome and would be very difficult to treat with ablation.  I tried to contact the patient after the MRI results were available but I have not spoken to the patient about the MRI results at this point.   These new / enlarging small lesions are suspicious for additional hepatocellular carcinoma.  AFP has decreased since the ablations but  continues to be mildly elevated. We could continue close surveillance with a follow-up MRI in 2 to 3 months. I would like to get [facility] opinion of [facility].  Patient is scheduled to see them in early February.  The 2.0 cm lesion would potentially be amendable to ultrasound-guided biopsy as well.  If these lesions demonstrate interval enlargement over a short time period, we may need to consider lobar treatment with chemoembolization or Y90 radioembolization.  ADDENDUM:  I was able to speak with patient about her MRI results.  She confirmed that she is scheduled to see Hepatology in a few weeks.    Electronically Signed: Arn Medal 06/14/2023, 3:13 PM   I spent a total of    10 Minutes in remote  clinical consultation, greater than 50% of which was counseling/coordinating care for hepatocellular carcinoma.    Visit type: Audio only (telephone). Audio (no video) only due to patient preference. Alternative for in-person consultation at [facility],  Patient ID: Marissa Warner, female   DOB: 03-10-1966, 58 y.o.   MRN: [id number]

## 2023-06-22 ENCOUNTER — Encounter: Payer: Self-pay | Admitting: *Deleted

## 2023-06-22 NOTE — Progress Notes (Signed)
Received message from Dr Lowella Dandy for patient to be added to GI conference 06/28/23, inbasket sent for pt to be added

## 2023-06-26 ENCOUNTER — Other Ambulatory Visit: Payer: Self-pay | Admitting: Diagnostic Radiology

## 2023-06-26 ENCOUNTER — Telehealth: Payer: Self-pay | Admitting: Hematology

## 2023-06-26 DIAGNOSIS — C220 Liver cell carcinoma: Secondary | ICD-10-CM

## 2023-06-27 ENCOUNTER — Other Ambulatory Visit: Payer: Self-pay | Admitting: Diagnostic Radiology

## 2023-06-27 DIAGNOSIS — C220 Liver cell carcinoma: Secondary | ICD-10-CM

## 2023-06-29 ENCOUNTER — Other Ambulatory Visit: Payer: Self-pay | Admitting: *Deleted

## 2023-06-29 NOTE — Progress Notes (Signed)
 The proposed treatment discussed in conference is for discussion purpose only and is not a binding recommendation.  The patients have not been physically examined, or presented with their treatment options.  Therefore, final treatment plans cannot be decided.

## 2023-07-03 ENCOUNTER — Ambulatory Visit
Admission: RE | Admit: 2023-07-03 | Discharge: 2023-07-03 | Disposition: A | Discharge status: Home / Self Care | Payer: Medicaid Other | Source: Ambulatory Visit | Attending: Diagnostic Radiology | Admitting: Diagnostic Radiology

## 2023-07-03 DIAGNOSIS — C220 Liver cell carcinoma: Secondary | ICD-10-CM

## 2023-07-05 ENCOUNTER — Ambulatory Visit
Admission: RE | Admit: 2023-07-05 | Discharge: 2023-07-05 | Disposition: A | Discharge status: Home / Self Care | Payer: Medicaid Other | Source: Ambulatory Visit | Attending: Diagnostic Radiology | Admitting: Diagnostic Radiology

## 2023-07-05 ENCOUNTER — Other Ambulatory Visit: Payer: Medicaid Other

## 2023-07-05 ENCOUNTER — Other Ambulatory Visit (HOSPITAL_COMMUNITY): Payer: Medicaid Other

## 2023-07-05 DIAGNOSIS — C220 Liver cell carcinoma: Secondary | ICD-10-CM

## 2023-07-05 NOTE — Progress Notes (Signed)
Chief Complaint: Patient was consulted remotely today (TeleHealth) for discussion of hepatocellular carcinoma management  Referring Physician(s): Drazek, Bernie Covey, Orie Rout    History of Present Illness: Marissa Warner is a 58 y.o. female with history of hepatitis B, biopsy-proven early cirrhosis and LI-RAD 5 liver lesions.  Patient underwent image guided microwave ablation of 2 hepatic lesions on 01/04/2023.  Larger lesion was at the hepatic dome in segment 8 and a smaller lesion within the right hepatic lobe in segment 5/6. Patient had follow-up MRI imaging on 06/09/2023.  The follow-up MRI demonstrated post ablation changes in the treated lesions with atypical enhancement of the treated segment 8 lesion.  In addition, the MRI demonstrated suspicious hypervascular lesions in segment 8 that were enlarging and compatible with LI-RAD 5 lesions.  Patient was discussed at transplant liver conference and according to [facility] Drazek's note: Reviewed imaging at transplant radiology conference. Our radiologist thought that lesions measured much smaller than reported. Also because these lesions were not well-appreciated on imaging in July 2024 they would not meet criteria for growth as an indicator that the additional lesions are HCC. Recommendation is to consider liver transplant versus repeat imaging at 3 months versus consideration for laparoscopic microwave ablation of these lesions.   I also presented the patient at the [facility] GI tumor conference and the consensus was to continue with liver directed therapies at this time and the patient will be scheduled to see Dr. Mosetta Putt in oncology.  Patient and daughter saw Marissa Warner today before our telephone visit.  Patient and daughter tell me that they are now interested in a liver transplant and planning to undergo more tests / evaluations.    Past Medical History:  Diagnosis Date   Allergy    environmental and seasonal   Blood transfusion  without reported diagnosis before 2004   due to heavy periods   Hepatitis B    Hypertension    Migraines 2007   Renal disorder    Pt. states "Stage III Kidney disease"  Never had kidney biopsy.  She is unable to tell me what the cause of the kidney disease.  Reportedly diagnosed with [facility] in [place].    Past Surgical History:  Procedure Laterality Date   ABDOMINAL HYSTERECTOMY  2004   With BSO, she thinks   BIOPSY  09/09/2019   Procedure: BIOPSY;  Surgeon: Kerin Salen, MD;  Location: [facility] ENDOSCOPY;  Service: Gastroenterology;;   CHOLECYSTECTOMY  uncertain date   Laparoscopic   COLONOSCOPY N/A 07/18/2022   Procedure: COLONOSCOPY;  Surgeon: Kerin Salen, MD;  Location: [facility] ENDOSCOPY;  Service: Gastroenterology;  Laterality: N/A;   COLONOSCOPY WITH PROPOFOL N/A 09/09/2019   Procedure: COLONOSCOPY WITH PROPOFOL;  Surgeon: Kerin Salen, MD;  Location: [facility] ENDOSCOPY;  Service: Gastroenterology;  Laterality: N/A;   ESOPHAGOGASTRODUODENOSCOPY (EGD) WITH PROPOFOL N/A 09/09/2019   Procedure: ESOPHAGOGASTRODUODENOSCOPY (EGD) WITH PROPOFOL;  Surgeon: Kerin Salen, MD;  Location: [facility] ENDOSCOPY;  Service: Gastroenterology;  Laterality: N/A;   ESOPHAGOGASTRODUODENOSCOPY (EGD) WITH PROPOFOL N/A 07/18/2022   Procedure: ESOPHAGOGASTRODUODENOSCOPY (EGD) WITH PROPOFOL;  Surgeon: Kerin Salen, MD;  Location: [facility] ENDOSCOPY;  Service: Gastroenterology;  Laterality: N/A;   LAPAROSCOPIC INCISIONAL / UMBILICAL / VENTRAL HERNIA REPAIR  2000   POLYPECTOMY  07/18/2022   Procedure: POLYPECTOMY;  Surgeon: Kerin Salen, MD;  Location: [facility] ENDOSCOPY;  Service: Gastroenterology;;   RADIOLOGY WITH ANESTHESIA N/A 01/04/2023   Procedure: CT WITH ANESTHESIA MICROWAVE ABLATION;  Surgeon: Richarda Overlie, MD;  Location: [facility] ORS;  Service: Anesthesiology;  Laterality: N/A;    Allergies: Patient has no known allergies.  Medications: Prior to Admission medications   Medication Sig Start Date End Date Taking? Authorizing Provider   acetaminophen (TYLENOL) 500 MG tablet Take 500-1,000 mg by mouth every 6 (six) hours as needed for mild pain.    [provider]  carvedilol (COREG) 6.25 MG tablet Take 1 tablet (6.25 mg total) by mouth 2 (two) times daily. 07/02/21   [name], MD  diclofenac Sodium (VOLTAREN) 1 % GEL Apply 4 g topically 4 (four) times daily. Patient not taking: Reported on 12/16/2022 06/03/19   Julieanne Manson, MD  entecavir (BARACLUDE) 1 MG tablet Take 1 mg by mouth daily.    [provider]  fluticasone (FLONASE) 50 MCG/ACT nasal spray Place 1 spray into both nostrils daily.    [provider]  HYDROcodone-acetaminophen (NORCO) 5-325 MG tablet Take 1 tablet by mouth every 6 (six) hours as needed for moderate pain. 01/04/23   Allred, Darrell K, PA-C  losartan (COZAAR) 25 MG tablet Take 25 mg by mouth daily. 07/11/22   [provider]  polyethylene glycol powder (GLYCOLAX/MIRALAX) 17 GM/SCOOP powder Take 17 g by mouth daily as needed for moderate constipation.    [provider]  Vitamin D, Ergocalciferol, (DRISDOL) 1.25 MG (50000 UNIT) CAPS capsule Take 50,000 Units by mouth every Monday.    [provider]     No family history on file.  Social History   Socioeconomic History   Marital status: Single    Spouse name: Not on file   Number of children: 4   Years of education: 12   Highest education level: Not on file  Occupational History   Occupation: unemployed    Comment: Previously in housekeeping  Tobacco Use   Smoking status: Never   Smokeless tobacco: Current    Types: Chew   Tobacco comments:    down to once daily or every other day  Vaping Use   Vaping status: Never Used  Substance and Sexual Activity   Alcohol use: Not Currently   Drug use: No   Sexual activity: Never    Partners: Male  Other Topics Concern   Not on file  Social History Narrative   Originally from [place], not far from [place], [place]. [place])    Moved to [place] in March 2017 to be near daughter.   Lives alone near [place].   Social Drivers of Corporate investment banker Strain: Not on file  Food Insecurity: No Food Insecurity (07/15/2022)   Hunger Vital Sign    Worried About Running Out of Food in the Last Year: Never true    Ran Out of Food in the Last Year: Never true  Recent Concern: Food Insecurity - Medium Risk (06/15/2022)   [facility]   Hunger Vital Sign    Worried About Running Out of Food in the Last Year: Sometimes true    Ran Out of Food in the Last Year: Sometimes true  Transportation Needs: No Transportation Needs (07/15/2022)   PRAPARE - Administrator, Civil Service (Medical): No    Lack of Transportation (Non-Medical): No  Recent Concern: Transportation Needs - Unmet Transportation Needs (06/15/2022)   [facility]   Transportation    In the past 12 months, has lack of reliable transportation kept you from medical appointments, meetings, work or from getting things needed for daily living? :  Yes  Physical Activity: Not on file  Stress: Not on file  Social Connections: Not on file        Physical Exam No direct physical exam was performed   Vital Signs: There were no vitals taken for this visit.  Imaging: US Abdomen Limited RUQ (LIVER/GB) Result Date: 07/03/2023 CLINICAL DATA:  58 year old with hepatocellular carcinoma and status post percutaneous microwave ablation of 2 liver lesions on 01/04/2023. Recent MRI raises concern for 2 new suspicious liver lesions. Ultrasound ordered to see if these liver lesions are visible with ultrasound. EXAM: ULTRASOUND ABDOMEN LIMITED RIGHT UPPER QUADRANT COMPARISON:  Liver MRI 06/09/2023 FINDINGS: Gallbladder: Cholecystectomy. Common bile duct: Diameter: 0.6 cm Liver: Liver is mildly heterogeneous. 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 Hyperechoic lesion with subtle rim of hypoechogenicity at the hepatic dome. This likely corresponds with the previously ablated lesion at segment 8 and is labeled as lesion #1. This lesion measures 2.2 x 2.2 x 2.4 cm. Slightly hypoechoic lesion (#2) in the right hepatic lobe measures 2.2 x 1.6 x 1.5 cm. This likely corresponds with the enlarging lesion in segment 8 on the recent MRI. Lesion #3 is a hyperechoic lesion near the gallbladder fossa that roughly measures 1.6 x 1.6 x 1.3 cm and compatible with a previously ablated lesion. No other liver lesions are confidently identified. Other: None. IMPRESSION: 1. Total of 3 liver lesions are identified with ultrasound. Two of the lesions are hyperechoic and correspond with the previously ablated lesions. There is a slightly hypoechoic lesion in the right hepatic lobe that measures up to 2.2 cm and corresponds with the suspicious enlarging lesion in segment 8 seen on the previous MRI. Previous MRI also raised concern for an additional lesion at the hepatic dome but this is not visualized with ultrasound. Electronically Signed   By: Richarda Overlie M.D.   On: 07/03/2023 14:20   MR LIVER W WO CONTRAST Result Date: 06/14/2023 CLINICAL DATA:  Follow-up hepatocellular carcinoma. Previous microwave ablation. EXAM: MRI ABDOMEN WITHOUT AND WITH CONTRAST TECHNIQUE: Multiplanar multisequence MR imaging of the abdomen was performed both before and after the administration of intravenous contrast. CONTRAST:  7.90mL GADAVIST GADOBUTROL 1 MMOL/ML IV SOLN COMPARISON:  12/02/2022 and 07/16/2022 FINDINGS: Lower chest: No acute findings. Hepatobiliary: Hepatic cirrhosis again demonstrated. No No evidence of biliary ductal dilatation. Portosystemic venous collaterals again seen in the left quadrant, consistent with portal venous hypertension. 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 Ablation defect showing T1 hyperintense  blood products is now seen in segment 8 at the site of the previous hypervascular mass. T1 hyperintense blood products limit evaluation on dynamic subtraction imaging, but there is evidence of enhancement within the periphery of the defect which shows some irregularity. This is atypical for treatment expected enhancement, but does not meet criteria for probably or definitely viable. (LR-TR equivocal) Ablation defect showing T1 hyperintense blood products is now seen in segment 6 at the site of the previous hypervascular mass. No suspicious residual contrast enhancement is seen at this site to suggest viable neoplasm. (LR-TR nonviable) A lesion showing arterial phase hyperenhancement is seen in the posterior dome of segment 8 which measures 2.0 x 1.4 cm on image 28/801. This appears increased in size from approximately 5 mm on earlier study on 07/16/2022. This lesion shows no definite contrast washout or delayed capsular enhancement, but because of interval growth, is consistent with a LI-RADS 5 lesion. Another  lesion showing arterial phase hyperenhancement is seen in segment 8 which measures 1.3 x 0.9 cm on image 44/801. This has increased in size from 5 mm on 07/16/2022 exam, and also shows contrast washout and delayed capsular enhancement, consistent with a LI-RADS 5 lesion. Pancreas: No mass or inflammatory changes. Spleen:  Within normal limits in size and appearance. Adrenals/Urinary Tract: No suspicious masses identified. Tiny subcapsular Bosniak category 2 hemorrhagic cyst again seen in the left kidney. (No followup imaging is recommended) No evidence of hydronephrosis. Stomach/Bowel: Unremarkable. Vascular/Lymphatic: No pathologically enlarged lymph nodes identified. No acute vascular findings. Other:  None. Musculoskeletal:  No suspicious bone lesions identified. IMPRESSION: Interval ablation of segment 8 hepatocellular carcinoma, which shows atypical peripheral enhancement, but does not meet criteria for  probably or definitely viable tumor. (LR-TR equivocal). Interval ablation of segment 6 hepatocellular carcinoma. No evidence of viable tumor at this site (LR-TR nonviable). Two enlarging hypervascular masses in segment 8, consistent with LI-RADS 5 lesions. (LI-RADS Category 5: Definitely HCC) No evidence of extrahepatic metastatic disease. Hepatic cirrhosis and portal venous hypertension. 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 Electronically Signed   By: Danae Orleans M.D.   On: 06/14/2023 13:24    Labs:  CBC: Recent Labs    11/02/22 0833 11/14/22 1050 12/19/22 0846 01/04/23 0718  WBC 4.7 4.6 5.5 5.0  HGB 8.2* 9.2* 10.0* 8.1*  HCT 24.0* 28.9* 30.0* 24.3*  PLT 96* 97* 118* 91*    COAGS: Recent Labs    07/15/22 0020 07/16/22 0627 09/05/22 1139 01/04/23 0718  INR 1.7* 1.7* 1.5* 1.4*    BMP: Recent Labs    07/27/22 1411 11/02/22 0833 11/14/22 1050 12/19/22 0846 01/04/23 0718  NA 138 140 138 135 136  K 3.4* 3.6 3.7 3.8 4.1  CL 109 111 105 106 109  CO2 26 27 25 22 23   GLUCOSE 87 108* 125* 90 94  BUN 10 13 16 18 17   CALCIUM 8.2* 8.5* 8.5* 8.9 8.4*  CREATININE 0.89 0.93 1.22* 1.14* 1.11*  GFRNONAA >60 >60  --  55* 57*    LIVER FUNCTION TESTS: Recent Labs    07/27/22 1411 11/02/22 0833 11/14/22 1050 12/19/22 0846 01/04/23 0718  BILITOT 1.9* 1.0 1.3* 1.5* 0.9  AST 37 30 34 37 33  ALT 13 13 12 19 17   ALKPHOS 81 93  --  86 73  PROT 7.9 7.0 7.4 8.0 6.9  ALBUMIN 2.4* 2.5*  --  2.9* 2.5*    TUMOR MARKERS: No results for input(s): "AFPTM", "CEA", "CA199", "CHROMGRNA" in the last 8760 hours.  Assessment and Plan:  58 yo with biopsy proven early cirrhosis and status post microwave ablation of 2 LI-RADS lesions.  Recent MRI suggests at least 2 more suspicious lesions.  These lesions are difficult to evaluate and there seems to be some disagreement between the official report and the interpretation during the liver  transplant conference.  RUQ ultrasound on 07/03/23 demonstrated the treated lesions but also identified a 2.2 x 1.6 x 1.5 cm in the right hepatic lobe.  I suspect this represents one of the enlarging lesions seen on recent MRI.    Based on my previous communications with Marissa Warner, patient is still a candidate for liver directed therapies while she is getting evaluated for liver transplantation.  I am concerned the patient is at risk to keep developing new lesions and I expressed this to the patient and her daughter.  The new lesions are  somewhat equivocal on imaging but I very concerned about the new lesion that is seen on recent US.    We discussed management options including close imaging surveillance of the new lesions vs. catheter directed liver therapies vs microwave ablation of the new lesion seen on ultrasound.  I believe the new lesion on [place] is amendable to image guided microwave ablation.  The other suspicious lesion at the dome will be much harder to treat with percutaneous ablation and may be difficult to localize even with catheter-directed therapy.  Therefore, we will plan for image guided microwave ablation of the new right hepatic lesion seen on ultrasound and continue with close surveillance of the liver and other lesions.    Electronically Signed: Arn Medal 07/05/2023, 7:39 PM   I spent a total of    15 Minutes in remote  clinical consultation, greater than 50% of which was counseling/coordinating care for hepatocellular carcinoma.    Visit type: Audio only (telephone). Audio (no video) only due to schedule conflicts with in-person visit. Alternative for in-person consultation at [facility], [address] [place], [place], [place]. This visit type was conducted due to national recommendations for restrictions regarding the COVID-19 Pandemic (e.g. social distancing).  This format is felt to be most appropriate for this patient at this time.  All issues noted in this document  were discussed and addressed.  Patient ID: Marissa Warner, female   DOB: 1965-08-22, 58 y.o.   MRN: [id number]

## 2023-07-09 NOTE — Assessment & Plan Note (Signed)
-  h/o untreated hepatitis B, liver cirrhosis, and GI bleeding.  -Child-Pugh score is 8 (class B) -Found to have 1.6 cm liver mass in segment 8 right liver with typical image findings consistent with HCC, and elevated AFP at 12, so diagnosis of HCC is quite certain. biopsy was not recommended.  -CT chest negative for distant metastasis  -To be a poor surgical candidate due to decompensated cirrhosis and not a transplant candidate. He underwent microwave ablation of segment 8 and segment 6 lesions in 12/2022 by Dr. Lowella Dandy -repeated liver MRI in 05/2023 showed two enlarging hypervascular masses in segment 8 measuring 2cm and 1.5cm, consistent with LI-RADS 5 lesions. Case discussed in tumor board, and we recommend Y90.  -I also discussed systemic therapy if she has further disease progression  -she also f/u with liver clinic NP [facility]

## 2023-07-10 ENCOUNTER — Inpatient Hospital Stay (HOSPITAL_BASED_OUTPATIENT_CLINIC_OR_DEPARTMENT_OTHER): Payer: Medicaid Other | Admitting: Hematology

## 2023-07-10 ENCOUNTER — Inpatient Hospital Stay: Payer: Medicaid Other | Attending: Hematology

## 2023-07-10 VITALS — BP 134/68 | HR 77 | Temp 97.2°F | Resp 18 | Wt 177.3 lb

## 2023-07-10 DIAGNOSIS — Z79899 Other long term (current) drug therapy: Secondary | ICD-10-CM | POA: Diagnosis not present

## 2023-07-10 DIAGNOSIS — D649 Anemia, unspecified: Secondary | ICD-10-CM | POA: Diagnosis not present

## 2023-07-10 DIAGNOSIS — C220 Liver cell carcinoma: Secondary | ICD-10-CM | POA: Insufficient documentation

## 2023-07-10 LAB — CMP (CANCER CENTER ONLY)
ALT: 9 U/L (ref 0–44)
AST: 25 U/L (ref 15–41)
Albumin: 2.7 g/dL — ABNORMAL LOW (ref 3.5–5.0)
Alkaline Phosphatase: 86 U/L (ref 38–126)
Anion gap: 1 — ABNORMAL LOW (ref 5–15)
BUN: 13 mg/dL (ref 6–20)
CO2: 25 mmol/L (ref 22–32)
Calcium: 8.3 mg/dL — ABNORMAL LOW (ref 8.9–10.3)
Chloride: 113 mmol/L — ABNORMAL HIGH (ref 98–111)
Creatinine: 1 mg/dL (ref 0.44–1.00)
GFR, Estimated: >60 mL/min (ref >60)
Glucose, Bld: 106 mg/dL — ABNORMAL HIGH (ref 70–99)
Potassium: 4 mmol/L (ref 3.5–5.1)
Sodium: 139 mmol/L (ref 135–145)
Total Bilirubin: 1 mg/dL (ref 0.0–1.2)
Total Protein: 6.4 g/dL — ABNORMAL LOW (ref 6.5–8.1)

## 2023-07-10 LAB — CBC WITH DIFFERENTIAL (CANCER CENTER ONLY)
Abs Immature Granulocytes: 0.01 10*3/uL (ref 0.00–0.07)
Basophils Absolute: 0 10*3/uL (ref 0.0–0.1)
Basophils Relative: 1 %
Eosinophils Absolute: 0.1 10*3/uL (ref 0.0–0.5)
Eosinophils Relative: 3 %
HCT: 24.9 % — ABNORMAL LOW (ref 36.0–46.0)
Hemoglobin: 8.4 g/dL — ABNORMAL LOW (ref 12.0–15.0)
Immature Granulocytes: 0 %
Lymphocytes Relative: 37 %
Lymphs Abs: 1.3 10*3/uL (ref 0.7–4.0)
MCH: 25.5 pg — ABNORMAL LOW (ref 26.0–34.0)
MCHC: 33.7 g/dL (ref 30.0–36.0)
MCV: 75.7 fL — ABNORMAL LOW (ref 80.0–100.0)
Monocytes Absolute: 0.5 10*3/uL (ref 0.1–1.0)
Monocytes Relative: 13 %
Neutro Abs: 1.7 10*3/uL (ref 1.7–7.7)
Neutrophils Relative %: 46 %
Platelet Count: 69 10*3/uL — ABNORMAL LOW (ref 150–400)
RBC: 3.29 MIL/uL — ABNORMAL LOW (ref 3.87–5.11)
RDW: 17.3 % — ABNORMAL HIGH (ref 11.5–15.5)
WBC Count: 3.6 10*3/uL — ABNORMAL LOW (ref 4.0–10.5)
nRBC: 0 % (ref 0.0–0.2)

## 2023-07-10 LAB — FERRITIN: Ferritin: 212 ng/mL (ref 11–307)

## 2023-07-10 NOTE — Progress Notes (Signed)
 [facility]   Telephone:([phone] Fax:(336) [phone]   Clinic Follow up Note   Patient Care Team: Fleet Contras, MD as PCP - General (Internal Medicine) [name], MD as PCP - Cardiology (Cardiology)  Date of Service:  07/10/2023  CHIEF COMPLAINT: f/u of HCC  CURRENT THERAPY:  Pending Y90 and liver transplant evaluation  Oncology History   Hepatocellular carcinoma (HCC) -h/o untreated hepatitis B, liver cirrhosis, and GI bleeding.  -Child-Pugh score is 8 (class B) -Found to have 1.6 cm liver mass in segment 8 right liver with typical image findings consistent with HCC, and elevated AFP at 12, so diagnosis of HCC is quite certain. biopsy was not recommended.  -CT chest negative for distant metastasis  -To be a poor surgical candidate due to decompensated cirrhosis and not a transplant candidate. He underwent microwave ablation of segment 8 and segment 6 lesions in 12/2022 by Dr. Lowella Dandy -repeated liver MRI in 05/2023 showed two enlarging hypervascular masses in segment 8 measuring 2cm and 1.5cm, consistent with LI-RADS 5 lesions. Case discussed in tumor board, and we recommend Y90.  -I also discussed systemic therapy if she has further disease progression  -she also f/u with liver clinic NP Dawn Drazek    Assessment and Plan    Hepatocellular Carcinoma (HCC) Follow-up for HCC. Post-ablation, MRI in January revealed two new lesions. Discussed Y90 radioembolization and systemic therapy. Y90 preferred due to lesion size and rapid disease progression. Explained Y90 delivers radiation-coated beads to the liver to kill tumor cells and cut off blood supply. Systemic therapy involves intravenous medication, is long-term, and not curative. Transplant is the best long-term option but requires time to arrange. Emphasized that without treatment, cancer may progress beyond transplant viability. - Schedule Y90 radioembolization with Dr. Lowella Dandy - She has been referred to  [facility] for liver transplant evaluation  Anemia Hemoglobin at 8.4. Reports symptoms consistent with anemia. Discussed dietary modifications and supplementation. Previous blood transfusions noted. Recommended prenatal vitamins for iron, B12, and folate supplementation. - Check iron levels - Start prenatal vitamins for iron, B12, and folate supplementation - Continue Vitamin D supplementation - Consider IV iron if iron levels are low - Follow up in three months to reassess anemia  Plan -She will follow-up with Dr. Lowella Dandy for Y90 in the near future -Pending transplant evaluation at [facility] - Follow up in six months for [facility] - Follow up in three months for anemia.        Discussed the use of AI scribe software for clinical note transcription with the patient, who gave verbal consent to proceed.  History of Present Illness   Marissa Warner, a 58 year old patient with a history of Hepatocellular Carcinoma (HCC), presents for a follow-up visit. She underwent ablation therapy last year, but a recent MRI in January revealed two new lesions. She reports no pain in the liver area. This procedure involves delivering nuclear material coated beads to the liver via a wire inserted in the groin area. The beads provide localized radiation and cut off the blood supply to the tumor.  In addition to her [facility], Marissa Warner has a history of anemia. She has received two blood transfusions in the past and currently has a low hemoglobin level of 8.4. She does not report any symptoms related to her anemia. She is currently taking Vitamin D and is considering starting prenatal vitamins for her iron, B12, and folate content.         All other systems were reviewed with  the patient and are negative.  MEDICAL HISTORY:  Past Medical History:  Diagnosis Date   Allergy    environmental and seasonal   Blood transfusion without reported diagnosis before 2004   due to heavy periods   Hepatitis B    Hypertension    Migraines 2007    Renal disorder    Pt. states "Stage III Kidney disease"  Never had kidney biopsy.  She is unable to tell me what the cause of the kidney disease.  Reportedly diagnosed with [facility] in [place].    SURGICAL HISTORY: Past Surgical History:  Procedure Laterality Date   ABDOMINAL HYSTERECTOMY  2004   With BSO, she thinks   BIOPSY  09/09/2019   Procedure: BIOPSY;  Surgeon: Kerin Salen, MD;  Location: [facility] ENDOSCOPY;  Service: Gastroenterology;;   CHOLECYSTECTOMY  uncertain date   Laparoscopic   COLONOSCOPY N/A 07/18/2022   Procedure: COLONOSCOPY;  Surgeon: Kerin Salen, MD;  Location: [facility] ENDOSCOPY;  Service: Gastroenterology;  Laterality: N/A;   COLONOSCOPY WITH PROPOFOL N/A 09/09/2019   Procedure: COLONOSCOPY WITH PROPOFOL;  Surgeon: Kerin Salen, MD;  Location: [facility] ENDOSCOPY;  Service: Gastroenterology;  Laterality: N/A;   ESOPHAGOGASTRODUODENOSCOPY (EGD) WITH PROPOFOL N/A 09/09/2019   Procedure: ESOPHAGOGASTRODUODENOSCOPY (EGD) WITH PROPOFOL;  Surgeon: Kerin Salen, MD;  Location: [facility] ENDOSCOPY;  Service: Gastroenterology;  Laterality: N/A;   ESOPHAGOGASTRODUODENOSCOPY (EGD) WITH PROPOFOL N/A 07/18/2022   Procedure: ESOPHAGOGASTRODUODENOSCOPY (EGD) WITH PROPOFOL;  Surgeon: Kerin Salen, MD;  Location[facility] ENDOSCOPY;  Service: Gastroenterology;  Laterality: N/A;   LAPAROSCOPIC INCISIONAL / UMBILICAL / VENTRAL HERNIA REPAIR  2000   POLYPECTOMY  07/18/2022   Procedure: POLYPECTOMY;  Surgeon: Kerin Salen, MD;  Location: [facility] ENDOSCOPY;  Service: Gastroenterology;;   RADIOLOGY WITH ANESTHESIA N/A 01/04/2023   Procedure: CT WITH ANESTHESIA MICROWAVE ABLATION;  Surgeon: Richarda Overlie, MD;  Location: [facility] ORS;  Service: Anesthesiology;  Laterality: N/A;    I have reviewed the social history and family history with the patient and they are unchanged from previous note.  ALLERGIES:  has no known allergies.  MEDICATIONS:  Current Outpatient Medications  Medication Sig Dispense Refill   acetaminophen  (TYLENOL) 500 MG tablet Take 500-1,000 mg by mouth every 6 (six) hours as needed for mild pain.     carvedilol (COREG) 6.25 MG tablet Take 1 tablet (6.25 mg total) by mouth 2 (two) times daily. 180 tablet 3   diclofenac Sodium (VOLTAREN) 1 % GEL Apply 4 g topically 4 (four) times daily. (Patient not taking: Reported on 12/16/2022) 100 g 11   entecavir (BARACLUDE) 1 MG tablet Take 1 mg by mouth daily.     fluticasone (FLONASE) 50 MCG/ACT nasal spray Place 1 spray into both nostrils daily.     HYDROcodone-acetaminophen (NORCO) 5-325 MG tablet Take 1 tablet by mouth every 6 (six) hours as needed for moderate pain. 20 tablet 0   losartan (COZAAR) 25 MG tablet Take 25 mg by mouth daily.     polyethylene glycol powder (GLYCOLAX/MIRALAX) 17 GM/SCOOP powder Take 17 g by mouth daily as needed for moderate constipation.     Vitamin D, Ergocalciferol, (DRISDOL) 1.25 MG (50000 UNIT) CAPS capsule Take 50,000 Units by mouth every Monday.     No current facility-administered medications for this visit.    PHYSICAL EXAMINATION: ECOG PERFORMANCE STATUS: 0 - Asymptomatic  Vitals:   07/10/23 1008  BP: 134/68  Pulse: 77  Resp: 18  Temp: (!) 97.2 F (36.2 C)  SpO2: 100%   Wt Readings from Last 3  Encounters:  07/10/23 177 lb 4.8 oz (80.4 kg)  01/04/23 169 lb 12.1 oz (77 kg)  12/19/22 170 lb (77.1 kg)     GENERAL:alert, no distress and comfortable SKIN: skin color, texture, turgor are normal, no rashes or significant lesions EYES: normal, Conjunctiva are pink and non-injected, sclera clear NECK: supple, thyroid normal size, non-tender, without nodularity LYMPH:  no palpable lymphadenopathy in the cervical, axillary  LUNGS: clear to auscultation and percussion with normal breathing effort HEART: regular rate & rhythm and no murmurs and no lower extremity edema ABDOMEN:abdomen soft, non-tender and normal bowel sounds Musculoskeletal:no cyanosis of digits and no clubbing  NEURO: alert & oriented x 3  with fluent speech, no focal motor/sensory deficits   LABORATORY DATA:  I have reviewed the data as listed    Latest Ref Rng & Units 07/10/2023    9:47 AM 01/04/2023    7:18 AM 12/19/2022    8:46 AM  CBC  WBC 4.0 - 10.5 K/uL 3.6  5.0  5.5   Hemoglobin 12.0 - 15.0 g/dL 8.4  8.1  96.0   Hematocrit 36.0 - 46.0 % 24.9  24.3  30.0   Platelets 150 - 400 K/uL 69  91  118         Latest Ref Rng & Units 07/10/2023    9:47 AM 01/04/2023    7:18 AM 12/19/2022    8:46 AM  CMP  Glucose 70 - 99 mg/dL 454  94  90   BUN 6 - 20 mg/dL 13  17  18    Creatinine 0.44 - 1.00 mg/dL 0.98  1.19  1.47   Sodium 135 - 145 mmol/L 139  136  135   Potassium 3.5 - 5.1 mmol/L 4.0  4.1  3.8   Chloride 98 - 111 mmol/L 113  109  106   CO2 22 - 32 mmol/L 25  23  22    Calcium 8.9 - 10.3 mg/dL 8.3  8.4  8.9   Total Protein 6.5 - 8.1 g/dL 6.4  6.9  8.0   Total Bilirubin 0.0 - 1.2 mg/dL 1.0  0.9  1.5   Alkaline Phos 38 - 126 U/L 86  73  86   AST 15 - 41 U/L 25  33  37   ALT 0 - 44 U/L 9  17  19        RADIOGRAPHIC STUDIES: I have personally reviewed the radiological images as listed and agreed with the findings in the report. No results found.    No orders of the defined types were placed in this encounter.  All questions were answered. The patient knows to call the clinic with any problems, questions or concerns. No barriers to learning was detected. The total time spent in the appointment was 25 minutes.     Malachy Mood, MD 07/10/2023

## 2023-07-11 ENCOUNTER — Other Ambulatory Visit (HOSPITAL_COMMUNITY): Payer: Self-pay | Admitting: Diagnostic Radiology

## 2023-07-11 ENCOUNTER — Encounter: Payer: Self-pay | Admitting: Nurse Practitioner

## 2023-07-11 DIAGNOSIS — C220 Liver cell carcinoma: Secondary | ICD-10-CM

## 2023-07-11 LAB — AFP TUMOR MARKER: AFP, Serum, Tumor Marker: 10.2 ng/mL — ABNORMAL HIGH (ref 0.0–9.2)

## 2023-07-13 ENCOUNTER — Inpatient Hospital Stay (HOSPITAL_COMMUNITY): Admission: RE | Admit: 2023-07-13 | Payer: Medicaid Other | Source: Ambulatory Visit

## 2023-07-13 ENCOUNTER — Other Ambulatory Visit (HOSPITAL_COMMUNITY): Payer: Self-pay | Admitting: Diagnostic Radiology

## 2023-07-13 ENCOUNTER — Ambulatory Visit (HOSPITAL_COMMUNITY)
Admission: RE | Admit: 2023-07-13 | Discharge: 2023-07-13 | Disposition: A | Discharge status: Home / Self Care | Payer: Medicaid Other | Source: Ambulatory Visit | Attending: Diagnostic Radiology | Admitting: Diagnostic Radiology

## 2023-07-13 DIAGNOSIS — C220 Liver cell carcinoma: Secondary | ICD-10-CM

## 2023-07-13 HISTORY — PX: IR PATIENT EVAL TECH 0-60 MINS: IMG5564

## 2023-07-13 NOTE — Progress Notes (Signed)
 Chief Complaint: Patient was seen in consultation today for follow-up of hepatocellular carcinoma.  Referring Physician(s): Marissa Warner, Collene Mares, Terrace [place]  History of Present Illness:  58 year old with cirrhosis and presumed hepatocellular carcinoma.  Patient underwent image guided microwave ablation of 2 LI-RADS 5 lesions on 01/04/2023.  Patient tolerated the percutaneous ablations well.  Recent follow-up imaging demonstrates expected post ablation changes with equivocal enhancement involving the segment 8 lesion.  However, there are 2 suspicious enlarged small lesions in segment 8.  These lesions measure up to 2.0 cm and 1.3 cm.   I obtained an ultrasound of the liver recently which showed at least 1 new lesion and I suspect this corresponds with one of the enlarged segment 8 lesions.  Patient was seen today so that I could personally evaluate the liver with ultrasound.  Patient has no complaints.  She denies abdominal pain.  Patient and her family have decided that she would like to be considered for a liver transplant again.  Patient and family have requested to be evaluated at [facility] because it is closer for her and her family.  Patient has not met with the [facility] liver transplant team yet.  Past Medical History:  Diagnosis Date   Allergy    environmental and seasonal   Blood transfusion without reported diagnosis before 2004   due to heavy periods   Hepatitis B    Hypertension    Migraines 2007   Renal disorder    Pt. states "Stage III Kidney disease"  Never had kidney biopsy.  She is unable to tell me what the cause of the kidney disease.  Reportedly diagnosed with [facility] in [place].    Past Surgical History:  Procedure Laterality Date   ABDOMINAL HYSTERECTOMY  2004   With BSO, she thinks   BIOPSY  09/09/2019   Procedure: BIOPSY;  Surgeon: Kerin Salen, MD;  Location: [facility] ENDOSCOPY;  Service: Gastroenterology;;   CHOLECYSTECTOMY  uncertain date    Laparoscopic   COLONOSCOPY N/A 07/18/2022   Procedure: COLONOSCOPY;  Surgeon: Kerin Salen, MD;  Location: [facility] ENDOSCOPY;  Service: Gastroenterology;  Laterality: N/A;   COLONOSCOPY WITH PROPOFOL N/A 09/09/2019   Procedure: COLONOSCOPY WITH PROPOFOL;  Surgeon: Kerin Salen, MD;  Location: [facility] ENDOSCOPY;  Service: Gastroenterology;  Laterality: N/A;   ESOPHAGOGASTRODUODENOSCOPY (EGD) WITH PROPOFOL N/A 09/09/2019   Procedure: ESOPHAGOGASTRODUODENOSCOPY (EGD) WITH PROPOFOL;  Surgeon: Kerin Salen, MD;  Location: [facility] ENDOSCOPY;  Service: Gastroenterology;  Laterality: N/A;   ESOPHAGOGASTRODUODENOSCOPY (EGD) WITH PROPOFOL N/A 07/18/2022   Procedure: ESOPHAGOGASTRODUODENOSCOPY (EGD) WITH PROPOFOL;  Surgeon: Kerin Salen, MD;  Location: [facility] ENDOSCOPY;  Service: Gastroenterology;  Laterality: N/A;   LAPAROSCOPIC INCISIONAL / UMBILICAL / VENTRAL HERNIA REPAIR  2000   POLYPECTOMY  07/18/2022   Procedure: POLYPECTOMY;  Surgeon: Kerin Salen, MD;  Location: [facility] ENDOSCOPY;  Service: Gastroenterology;;   RADIOLOGY WITH ANESTHESIA N/A 01/04/2023   Procedure: CT WITH ANESTHESIA MICROWAVE ABLATION;  Surgeon: Richarda Overlie, MD;  Location: [facility] ORS;  Service: Anesthesiology;  Laterality: N/A;    Allergies: Patient has no known allergies.  Medications: Prior to Admission medications   Medication Sig Start Date End Date Taking? Authorizing Provider  acetaminophen (TYLENOL) 500 MG tablet Take 500-1,000 mg by mouth every 6 (six) hours as needed for mild pain.    [provider]  carvedilol (COREG) 6.25 MG tablet Take 1 tablet (6.25 mg total) by mouth 2 (two) times daily. 07/02/21   [name], MD  diclofenac Sodium (VOLTAREN) 1 % GEL  Apply 4 g topically 4 (four) times daily. Patient not taking: Reported on 12/16/2022 06/03/19   Julieanne Manson, MD  entecavir (BARACLUDE) 1 MG tablet Take 1 mg by mouth daily.    [provider]  fluticasone (FLONASE) 50 MCG/ACT nasal spray Place 1 spray into both  nostrils daily.    [provider]  HYDROcodone-acetaminophen (NORCO) 5-325 MG tablet Take 1 tablet by mouth every 6 (six) hours as needed for moderate pain. 01/04/23   Allred, Darrell K, PA-C  losartan (COZAAR) 25 MG tablet Take 25 mg by mouth daily. 07/11/22   [provider]  polyethylene glycol powder (GLYCOLAX/MIRALAX) 17 GM/SCOOP powder Take 17 g by mouth daily as needed for moderate constipation.    [provider]  Vitamin D, Ergocalciferol, (DRISDOL) 1.25 MG (50000 UNIT) CAPS capsule Take 50,000 Units by mouth every Monday.    [provider]     No family history on file.  Social History   Socioeconomic History   Marital status: Single    Spouse name: Not on file   Number of children: 4   Years of education: 12   Highest education level: Not on file  Occupational History   Occupation: unemployed    Comment: Previously in housekeeping  Tobacco Use   Smoking status: Never   Smokeless tobacco: Current    Types: Chew   Tobacco comments:    down to once daily or every other day  Vaping Use   Vaping status: Never Used  Substance and Sexual Activity   Alcohol use: Not Currently   Drug use: No   Sexual activity: Never    Partners: Male  Other Topics Concern   Not on file  Social History Narrative   Originally from [place], not far from [place], [place]. [place])   Moved to [place] in March 2017 to be near daughter.   Lives alone near [place].   Social Drivers of Corporate investment banker Strain: Not on file  Food Insecurity: Low Risk  (07/05/2023)   Received from [facility]   Hunger Vital Sign    Worried About Running Out of Food in the Last Year: Never true    Ran Out of Food in the Last Year: Never true  Transportation Needs: No Transportation Needs (07/05/2023)   Received from [employer]    In the past 12 months, has lack of reliable transportation kept you from medical appointments, meetings,  work or from getting things needed for daily living? : No  Physical Activity: Not on file  Stress: Not on file  Social Connections: Not on file     Review of Systems  Constitutional: Negative.   Respiratory: Negative.    Gastrointestinal: Negative.     Vital Signs: There were no vitals taken for this visit.    Physical Exam Constitutional:      Appearance: She is not ill-appearing.  Pulmonary:     Effort: Pulmonary effort is normal.  Abdominal:     General: Abdomen is flat. There is no distension.     Palpations: Abdomen is soft.     Tenderness: There is no abdominal tenderness.  Neurological:     Mental Status: She is alert.    I personally evaluated the liver with ultrasound.  I was able to identify 1 small lesion of the right hepatic lobe that corresponds with the lesion seen on the recent right upper quadrant ultrasound.  In addition, I could identify the previously  ablated lesions in the right hepatic lobe.  I was unable to to see lesion at the hepatic dome as was noted on the recent MRI.    Imaging: US Abdomen Limited RUQ (LIVER/GB) Result Date: 07/03/2023 CLINICAL DATA:  58 year old with hepatocellular carcinoma and status post percutaneous microwave ablation of 2 liver lesions on 01/04/2023. Recent MRI raises concern for 2 new suspicious liver lesions. Ultrasound ordered to see if these liver lesions are visible with ultrasound. EXAM: ULTRASOUND ABDOMEN LIMITED RIGHT UPPER QUADRANT COMPARISON:  Liver MRI 06/09/2023 FINDINGS: Gallbladder: Cholecystectomy. Common bile duct: Diameter: 0.6 cm Liver: Liver is mildly heterogeneous. 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 Hyperechoic lesion with subtle rim of hypoechogenicity at the hepatic dome. This likely corresponds with the previously ablated lesion at segment 8 and is labeled as lesion #1. This lesion measures 2.2 x 2.2 x 2.4 cm. Slightly hypoechoic lesion  (#2) in the right hepatic lobe measures 2.2 x 1.6 x 1.5 cm. This likely corresponds with the enlarging lesion in segment 8 on the recent MRI. Lesion #3 is a hyperechoic lesion near the gallbladder fossa that roughly measures 1.6 x 1.6 x 1.3 cm and compatible with a previously ablated lesion. No other liver lesions are confidently identified. Other: None. IMPRESSION: 1. Total of 3 liver lesions are identified with ultrasound. Two of the lesions are hyperechoic and correspond with the previously ablated lesions. There is a slightly hypoechoic lesion in the right hepatic lobe that measures up to 2.2 cm and corresponds with the suspicious enlarging lesion in segment 8 seen on the previous MRI. Previous MRI also raised concern for an additional lesion at the hepatic dome but this is not visualized with ultrasound. Electronically Signed   By: Richarda Overlie M.D.   On: 07/03/2023 14:20    Labs:  CBC: Recent Labs    11/14/22 1050 12/19/22 0846 01/04/23 0718 07/10/23 0947  WBC 4.6 5.5 5.0 3.6*  HGB 9.2* 10.0* 8.1* 8.4*  HCT 28.9* 30.0* 24.3* 24.9*  PLT 97* 118* 91* 69*    COAGS: Recent Labs    07/15/22 0020 07/16/22 0627 09/05/22 1139 01/04/23 0718  INR 1.7* 1.7* 1.5* 1.4*    BMP: Recent Labs    11/02/22 0833 11/14/22 1050 12/19/22 0846 01/04/23 0718 07/10/23 0947  NA 140 138 135 136 139  K 3.6 3.7 3.8 4.1 4.0  CL 111 105 106 109 113*  CO2 27 25 22 23 25   GLUCOSE 108* 125* 90 94 106*  BUN 13 16 18 17 13   CALCIUM 8.5* 8.5* 8.9 8.4* 8.3*  CREATININE 0.93 1.22* 1.14* 1.11* 1.00  GFRNONAA >60  --  55* 57* >60    LIVER FUNCTION TESTS: Recent Labs    11/02/22 0833 11/14/22 1050 12/19/22 0846 01/04/23 0718 07/10/23 0947  BILITOT 1.0 1.3* 1.5* 0.9 1.0  AST 30 34 37 33 25  ALT 13 12 19 17 9   ALKPHOS 93  --  86 73 86  PROT 7.0 7.4 8.0 6.9 6.4*  ALBUMIN 2.5*  --  2.9* 2.5* 2.7*    TUMOR MARKERS: No results for input(s): "AFPTM", "CEA", "CA199", "CHROMGRNA" in the last 8760  hours.  Assessment and Plan:  58 year old with cirrhosis and hepatocellular carcinoma.  Patient has already had 2 lesions treated with percutaneous microwave ablation.  Most recent MRI from 06/09/2023 demonstrates at least 2 other suspicious lesions that are considered LI-RADS 5.  Based on the MRI and recent ultrasound results, the largest lesion  is approximately 2.2 cm in segment 8.  1 of these lesions is visible with ultrasound and likely amendable to percutaneous microwave ablation.  The other lesion will be very difficult to take treat with percutaneous ablation because it is located at the dome based on the MRI and I cannot appreciate this lesion with ultrasound.  There are several small arterial foci in the liver based on my evaluation of the MRI and there is some equivocal enhancement around the previously treated segment 8 lesion.    Based on these imaging findings, patient may benefit more from catheter-directed treatment to the right lobe or segment 8.  However, I would like the patient to be evaluated by the [facility] liver transplant team prior to any additional liver directed therapy.  I discussed the different treatment options including microwave ablation of 1 liver lesion versus treating the right hepatic lobe or segment 8 with catheter directed therapy such as Y90 radioembolization or chemoembolization.  Patient and family understand that I would like to get [facility] liver transplant service involved before doing any other treatments because [facility] may prefer to do the those treatments at [facility] or offer additional options such as laparoscopic ablation.   Electronically Signed: Arn Medal 07/13/2023, 3:29 PM   I spent a total of    15 Minutes in face to face in clinical consultation, greater than 50% of which was counseling/coordinating care for hepatocellular carcinoma. Patient ID: Marissa Warner, female   DOB: [birth year], 58 y.o.   MRN: [id number]

## 2023-07-31 ENCOUNTER — Other Ambulatory Visit: Payer: Self-pay | Admitting: Diagnostic Radiology

## 2023-07-31 DIAGNOSIS — C220 Liver cell carcinoma: Secondary | ICD-10-CM

## 2023-08-16 ENCOUNTER — Other Ambulatory Visit: Payer: Self-pay | Admitting: Radiology

## 2023-08-17 ENCOUNTER — Encounter (HOSPITAL_COMMUNITY): Payer: Self-pay

## 2023-08-17 ENCOUNTER — Ambulatory Visit (HOSPITAL_COMMUNITY)

## 2023-08-17 ENCOUNTER — Other Ambulatory Visit (HOSPITAL_COMMUNITY)

## 2023-08-29 ENCOUNTER — Other Ambulatory Visit (HOSPITAL_COMMUNITY)

## 2023-08-29 ENCOUNTER — Ambulatory Visit (HOSPITAL_COMMUNITY)

## 2023-09-05 ENCOUNTER — Other Ambulatory Visit (HOSPITAL_COMMUNITY): Payer: Self-pay | Admitting: Diagnostic Radiology

## 2023-09-05 ENCOUNTER — Encounter (HOSPITAL_COMMUNITY): Payer: Self-pay | Admitting: Radiology

## 2023-09-05 DIAGNOSIS — C220 Liver cell carcinoma: Secondary | ICD-10-CM

## 2023-09-05 NOTE — Procedures (Signed)
 Marissa Griffes, MD  Physician Radiology   Progress Notes    Signed   Date of Service: 07/13/2023 11:00 AM   Signed     Expand All Collapse All          Chief Complaint: Patient was seen in consultation today for follow-up of hepatocellular carcinoma.   Referring Physician(s): Drazek, Sanders Crooks, Gracie Lav   History of Present Illness:   58 year old with cirrhosis and presumed hepatocellular carcinoma.  Patient underwent image guided microwave ablation of 2 LI-RADS 5 lesions on 01/04/2023.  Patient tolerated the percutaneous ablations well.  Recent follow-up imaging demonstrates expected post ablation changes with equivocal enhancement involving the segment 8 lesion.  However, there are 2 suspicious enlarged small lesions in segment 8.  These lesions measure up to 2.0 cm and 1.3 cm.    I obtained an ultrasound of the liver recently which showed at least 1 new lesion and I suspect this corresponds with one of the enlarged segment 8 lesions.  Patient was seen today so that I could personally evaluate the liver with ultrasound.   Patient has no complaints.  She denies abdominal pain.  Patient and her family have decided that she would like to be considered for a liver transplant again.  Patient and family have requested to be evaluated at [facility] because it is closer for her and her family.  Patient has not met with the [facility] liver transplant team yet.

## 2023-09-05 NOTE — Addendum Note (Signed)
 Encounter addended by: Adam Holm on: 09/05/2023 2:20 PM  Actions taken: Clinical Note Signed

## 2023-10-11 ENCOUNTER — Other Ambulatory Visit: Payer: Self-pay

## 2023-10-11 DIAGNOSIS — C220 Liver cell carcinoma: Secondary | ICD-10-CM

## 2023-10-12 ENCOUNTER — Inpatient Hospital Stay: Payer: Medicaid Other | Admitting: Hematology

## 2023-10-12 ENCOUNTER — Telehealth: Payer: Self-pay

## 2023-10-12 ENCOUNTER — Inpatient Hospital Stay: Payer: Medicaid Other | Attending: Hematology

## 2023-10-12 NOTE — Telephone Encounter (Signed)
 Patient did not arrive to her 10:45a LAB appt. Reached out to the patient to inquire whereabouts. Patient stated she thought her appointment was in [place], [place]. Transferred patient to scheduler to reschedule today's office visits.

## 2023-10-12 NOTE — Assessment & Plan Note (Deleted)
-  h/o untreated hepatitis B, liver cirrhosis, and GI bleeding.  -Child-Pugh score is 8 (class B) -Found to have 1.6 cm liver mass in segment 8 right liver with typical image findings consistent with HCC, and elevated AFP at 12, so diagnosis of HCC is quite certain. biopsy was not recommended.  -CT chest negative for distant metastasis  -To be a poor surgical candidate due to decompensated cirrhosis and not a transplant candidate. He underwent microwave ablation of segment 8 and segment 6 lesions in 12/2022 by Dr. Lowella Dandy -repeated liver MRI in 05/2023 showed two enlarging hypervascular masses in segment 8 measuring 2cm and 1.5cm, consistent with LI-RADS 5 lesions. Case discussed in tumor board, and we recommend Y90.  -I also discussed systemic therapy if she has further disease progression  -she also f/u with liver clinic NP [facility]
